# Patient Record
Sex: Female | Born: 1940 | Race: White | Hispanic: No | Smoking: Former smoker
Health system: Southern US, Community
[De-identification: ages and names within clinical notes are randomized; demographics above are authoritative.]

## PROBLEM LIST (undated history)

## (undated) DIAGNOSIS — I4901 Ventricular fibrillation: Secondary | ICD-10-CM

## (undated) DIAGNOSIS — I2699 Other pulmonary embolism without acute cor pulmonale: Secondary | ICD-10-CM

## (undated) DIAGNOSIS — I5181 Takotsubo syndrome: Secondary | ICD-10-CM

## (undated) DIAGNOSIS — I82409 Acute embolism and thrombosis of unspecified deep veins of unspecified lower extremity: Secondary | ICD-10-CM

## (undated) DIAGNOSIS — I639 Cerebral infarction, unspecified: Secondary | ICD-10-CM

## (undated) DIAGNOSIS — R011 Cardiac murmur, unspecified: Secondary | ICD-10-CM

## (undated) HISTORY — DX: Other pulmonary embolism without acute cor pulmonale: I26.99

## (undated) HISTORY — DX: Cardiac murmur, unspecified: R01.1

## (undated) HISTORY — DX: Takotsubo syndrome: I51.81

## (undated) HISTORY — DX: Acute embolism and thrombosis of unspecified deep veins of unspecified lower extremity: I82.409

## (undated) HISTORY — DX: Cerebral infarction, unspecified: I63.9

## (undated) HISTORY — DX: Ventricular fibrillation: I49.01

---

## 1989-06-10 DIAGNOSIS — I639 Cerebral infarction, unspecified: Secondary | ICD-10-CM

## 1989-06-10 HISTORY — DX: Cerebral infarction, unspecified: I63.9

## 2007-09-04 ENCOUNTER — Encounter: Admission: RE | Admit: 2007-09-04 | Discharge: 2007-09-04 | Payer: Self-pay | Admitting: Neurological Surgery

## 2007-09-24 ENCOUNTER — Inpatient Hospital Stay (HOSPITAL_COMMUNITY): Admission: RE | Admit: 2007-09-24 | Discharge: 2007-09-26 | Payer: Self-pay | Admitting: Neurological Surgery

## 2007-10-27 ENCOUNTER — Encounter: Admission: RE | Admit: 2007-10-27 | Discharge: 2007-10-27 | Payer: Self-pay | Admitting: Neurological Surgery

## 2007-12-17 ENCOUNTER — Encounter: Admission: RE | Admit: 2007-12-17 | Discharge: 2007-12-17 | Payer: Self-pay | Admitting: Neurological Surgery

## 2008-02-01 ENCOUNTER — Encounter: Admission: RE | Admit: 2008-02-01 | Discharge: 2008-02-01 | Payer: Self-pay | Admitting: Neurological Surgery

## 2008-03-15 ENCOUNTER — Encounter: Admission: RE | Admit: 2008-03-15 | Discharge: 2008-03-15 | Payer: Self-pay | Admitting: Neurological Surgery

## 2008-06-13 ENCOUNTER — Encounter: Admission: RE | Admit: 2008-06-13 | Discharge: 2008-06-13 | Payer: Self-pay | Admitting: Neurological Surgery

## 2008-07-11 ENCOUNTER — Encounter: Admission: RE | Admit: 2008-07-11 | Discharge: 2008-07-11 | Payer: Self-pay | Admitting: Neurological Surgery

## 2008-12-26 ENCOUNTER — Encounter: Admission: RE | Admit: 2008-12-26 | Discharge: 2008-12-26 | Payer: Self-pay | Admitting: Neurological Surgery

## 2009-07-03 ENCOUNTER — Encounter: Admission: RE | Admit: 2009-07-03 | Discharge: 2009-07-03 | Payer: Self-pay | Admitting: Neurological Surgery

## 2009-08-08 DIAGNOSIS — I5181 Takotsubo syndrome: Secondary | ICD-10-CM

## 2009-08-08 HISTORY — DX: Takotsubo syndrome: I51.81

## 2009-08-30 ENCOUNTER — Inpatient Hospital Stay (HOSPITAL_COMMUNITY): Admission: AC | Admit: 2009-08-30 | Discharge: 2009-09-26 | Payer: Self-pay

## 2009-09-07 ENCOUNTER — Encounter (INDEPENDENT_AMBULATORY_CARE_PROVIDER_SITE_OTHER): Payer: Self-pay | Admitting: Cardiology

## 2009-09-07 DIAGNOSIS — I4901 Ventricular fibrillation: Secondary | ICD-10-CM

## 2009-09-07 HISTORY — DX: Ventricular fibrillation: I49.01

## 2009-09-08 HISTORY — PX: CARDIAC CATHETERIZATION: SHX172

## 2009-09-21 ENCOUNTER — Ambulatory Visit: Payer: Self-pay | Admitting: Physical Medicine & Rehabilitation

## 2009-09-26 ENCOUNTER — Inpatient Hospital Stay (HOSPITAL_COMMUNITY)
Admission: RE | Admit: 2009-09-26 | Discharge: 2009-10-05 | Payer: Self-pay | Admitting: Physical Medicine & Rehabilitation

## 2009-09-26 ENCOUNTER — Ambulatory Visit: Payer: Self-pay | Admitting: Physical Medicine & Rehabilitation

## 2009-10-17 ENCOUNTER — Encounter: Admission: RE | Admit: 2009-10-17 | Discharge: 2009-10-17 | Payer: Self-pay | Admitting: Neurological Surgery

## 2009-11-14 ENCOUNTER — Encounter: Admission: RE | Admit: 2009-11-14 | Discharge: 2009-11-14 | Payer: Self-pay | Admitting: Neurological Surgery

## 2009-12-18 ENCOUNTER — Encounter: Admission: RE | Admit: 2009-12-18 | Discharge: 2009-12-18 | Payer: Self-pay | Admitting: Neurological Surgery

## 2009-12-28 ENCOUNTER — Emergency Department (HOSPITAL_COMMUNITY): Admission: EM | Admit: 2009-12-28 | Discharge: 2009-12-28 | Payer: Self-pay | Admitting: Emergency Medicine

## 2010-02-26 ENCOUNTER — Encounter: Admission: RE | Admit: 2010-02-26 | Discharge: 2010-02-26 | Payer: Self-pay | Admitting: Unknown Physician Specialty

## 2010-04-03 ENCOUNTER — Encounter: Admission: RE | Admit: 2010-04-03 | Discharge: 2010-04-03 | Payer: Self-pay | Admitting: Neurological Surgery

## 2010-07-01 ENCOUNTER — Encounter: Payer: Self-pay | Admitting: Neurological Surgery

## 2010-08-09 ENCOUNTER — Other Ambulatory Visit (HOSPITAL_COMMUNITY): Payer: Self-pay | Admitting: Cardiology

## 2010-08-09 DIAGNOSIS — R4781 Slurred speech: Secondary | ICD-10-CM

## 2010-08-09 DIAGNOSIS — H538 Other visual disturbances: Secondary | ICD-10-CM

## 2010-08-13 ENCOUNTER — Ambulatory Visit (HOSPITAL_COMMUNITY)
Admission: RE | Admit: 2010-08-13 | Discharge: 2010-08-13 | Disposition: A | Discharge status: Home / Self Care | Payer: Medicare Other | Source: Ambulatory Visit | Attending: Cardiology | Admitting: Cardiology

## 2010-08-13 DIAGNOSIS — R4789 Other speech disturbances: Secondary | ICD-10-CM | POA: Insufficient documentation

## 2010-08-13 DIAGNOSIS — H538 Other visual disturbances: Secondary | ICD-10-CM | POA: Insufficient documentation

## 2010-08-13 DIAGNOSIS — I6350 Cerebral infarction due to unspecified occlusion or stenosis of unspecified cerebral artery: Secondary | ICD-10-CM | POA: Insufficient documentation

## 2010-08-13 DIAGNOSIS — R4781 Slurred speech: Secondary | ICD-10-CM

## 2010-08-13 DIAGNOSIS — R51 Headache: Secondary | ICD-10-CM | POA: Insufficient documentation

## 2010-08-13 DIAGNOSIS — R29898 Other symptoms and signs involving the musculoskeletal system: Secondary | ICD-10-CM | POA: Insufficient documentation

## 2010-08-28 LAB — PROTIME-INR
INR: 3.02 — ABNORMAL HIGH (ref 0.00–1.49)
INR: 3.19 — ABNORMAL HIGH (ref 0.00–1.49)
INR: 3.39 — ABNORMAL HIGH (ref 0.00–1.49)
Prothrombin Time: 34 seconds — ABNORMAL HIGH (ref 11.6–15.2)
Prothrombin Time: 29 seconds — ABNORMAL HIGH (ref 11.6–15.2)

## 2010-08-28 LAB — CBC
HCT: 32.4 % — ABNORMAL LOW (ref 36.0–46.0)
HCT: 32.7 % — ABNORMAL LOW (ref 36.0–46.0)
HCT: 33.1 % — ABNORMAL LOW (ref 36.0–46.0)
Hemoglobin: 10.8 g/dL — ABNORMAL LOW (ref 12.0–15.0)
Hemoglobin: 10.9 g/dL — ABNORMAL LOW (ref 12.0–15.0)
Hemoglobin: 11.7 g/dL — ABNORMAL LOW (ref 12.0–15.0)
MCHC: 32.6 g/dL (ref 30.0–36.0)
MCHC: 32.8 g/dL (ref 30.0–36.0)
MCHC: 33.7 g/dL (ref 30.0–36.0)
MCHC: 34.2 g/dL (ref 30.0–36.0)
MCHC: 34.3 g/dL (ref 30.0–36.0)
MCV: 91.6 fL (ref 78.0–100.0)
MCV: 91.7 fL (ref 78.0–100.0)
MCV: 91.8 fL (ref 78.0–100.0)
MCV: 92.3 fL (ref 78.0–100.0)
Platelets: 222 10*3/uL (ref 150–400)
Platelets: 247 10*3/uL (ref 150–400)
RBC: 3.59 MIL/uL — ABNORMAL LOW (ref 3.87–5.11)
RBC: 3.7 MIL/uL — ABNORMAL LOW (ref 3.87–5.11)
RDW: 18.6 % — ABNORMAL HIGH (ref 11.5–15.5)
RDW: 18.7 % — ABNORMAL HIGH (ref 11.5–15.5)
RDW: 19.1 % — ABNORMAL HIGH (ref 11.5–15.5)
RDW: 19.3 % — ABNORMAL HIGH (ref 11.5–15.5)
WBC: 6.2 10*3/uL (ref 4.0–10.5)

## 2010-08-28 LAB — BASIC METABOLIC PANEL
BUN: 10 mg/dL (ref 6–23)
CO2: 27 mEq/L (ref 19–32)
Calcium: 9 mg/dL (ref 8.4–10.5)
Chloride: 104 mEq/L (ref 96–112)
Creatinine, Ser: 0.58 mg/dL (ref 0.4–1.2)
Creatinine, Ser: 0.66 mg/dL (ref 0.4–1.2)
GFR calc Af Amer: >60 mL/min (ref >60)
GFR calc non Af Amer: >60 mL/min (ref >60)
Glucose, Bld: 118 mg/dL — ABNORMAL HIGH (ref 70–99)
Glucose, Bld: 99 mg/dL (ref 70–99)
Potassium: 4 mEq/L (ref 3.5–5.1)

## 2010-08-28 LAB — URINE CULTURE
Colony Count: >=100000
Colony Count: >=100000

## 2010-08-28 LAB — URINALYSIS, MICROSCOPIC ONLY
Bilirubin Urine: NEGATIVE
Glucose, UA: NEGATIVE mg/dL
Ketones, ur: NEGATIVE mg/dL
Nitrite: NEGATIVE
Protein, ur: 30 mg/dL — AB
Protein, ur: NEGATIVE mg/dL
Specific Gravity, Urine: 1.021 (ref 1.005–1.030)
Urobilinogen, UA: 1 mg/dL (ref 0.0–1.0)
pH: 5.5 (ref 5.0–8.0)

## 2010-08-28 LAB — DIFFERENTIAL
Basophils Absolute: 0.1 10*3/uL (ref 0.0–0.1)
Eosinophils Relative: 4 % (ref 0–5)
Lymphocytes Relative: 28 % (ref 12–46)
Neutro Abs: 2.7 10*3/uL (ref 1.7–7.7)
Neutrophils Relative %: 55 % (ref 43–77)

## 2010-08-28 LAB — COMPREHENSIVE METABOLIC PANEL
ALT: 21 U/L (ref 0–35)
AST: 21 U/L (ref 0–37)
Alkaline Phosphatase: 178 U/L — ABNORMAL HIGH (ref 39–117)
CO2: 26 mEq/L (ref 19–32)
Chloride: 100 mEq/L (ref 96–112)
GFR calc Af Amer: >60 mL/min (ref >60)
GFR calc non Af Amer: >60 mL/min (ref >60)
Glucose, Bld: 107 mg/dL — ABNORMAL HIGH (ref 70–99)
Sodium: 135 mEq/L (ref 135–145)
Total Bilirubin: 0.6 mg/dL (ref 0.3–1.2)

## 2010-08-28 LAB — EXPECTORATED SPUTUM ASSESSMENT W GRAM STAIN, RFLX TO RESP C

## 2010-08-28 LAB — CULTURE, RESPIRATORY W GRAM STAIN: Culture: NORMAL

## 2010-08-29 LAB — BASIC METABOLIC PANEL
BUN: 5 mg/dL — ABNORMAL LOW (ref 6–23)
BUN: 5 mg/dL — ABNORMAL LOW (ref 6–23)
BUN: 6 mg/dL (ref 6–23)
BUN: 8 mg/dL (ref 6–23)
CO2: 23 mEq/L (ref 19–32)
CO2: 25 mEq/L (ref 19–32)
CO2: 25 mEq/L (ref 19–32)
CO2: 26 mEq/L (ref 19–32)
CO2: 27 mEq/L (ref 19–32)
CO2: 27 mEq/L (ref 19–32)
CO2: 28 mEq/L (ref 19–32)
CO2: 28 mEq/L (ref 19–32)
Calcium: 7.9 mg/dL — ABNORMAL LOW (ref 8.4–10.5)
Calcium: 8 mg/dL — ABNORMAL LOW (ref 8.4–10.5)
Calcium: 8 mg/dL — ABNORMAL LOW (ref 8.4–10.5)
Calcium: 8.3 mg/dL — ABNORMAL LOW (ref 8.4–10.5)
Calcium: 8.5 mg/dL (ref 8.4–10.5)
Calcium: 8.7 mg/dL (ref 8.4–10.5)
Chloride: 101 mEq/L (ref 96–112)
Chloride: 102 mEq/L (ref 96–112)
Chloride: 102 mEq/L (ref 96–112)
Chloride: 105 mEq/L (ref 96–112)
Chloride: 99 mEq/L (ref 96–112)
Creatinine, Ser: 0.43 mg/dL (ref 0.4–1.2)
Creatinine, Ser: 0.48 mg/dL (ref 0.4–1.2)
Creatinine, Ser: 0.48 mg/dL (ref 0.4–1.2)
Creatinine, Ser: 0.53 mg/dL (ref 0.4–1.2)
Creatinine, Ser: 0.64 mg/dL (ref 0.4–1.2)
GFR calc Af Amer: >60 mL/min (ref >60)
GFR calc Af Amer: >60 mL/min (ref >60)
GFR calc Af Amer: >60 mL/min (ref >60)
GFR calc Af Amer: >60 mL/min (ref >60)
GFR calc Af Amer: >60 mL/min (ref >60)
GFR calc Af Amer: >60 mL/min (ref >60)
GFR calc Af Amer: >60 mL/min (ref >60)
GFR calc non Af Amer: >60 mL/min (ref >60)
GFR calc non Af Amer: >60 mL/min (ref >60)
GFR calc non Af Amer: >60 mL/min (ref >60)
GFR calc non Af Amer: >60 mL/min (ref >60)
GFR calc non Af Amer: >60 mL/min (ref >60)
GFR calc non Af Amer: >60 mL/min (ref >60)
Glucose, Bld: 108 mg/dL — ABNORMAL HIGH (ref 70–99)
Glucose, Bld: 114 mg/dL — ABNORMAL HIGH (ref 70–99)
Glucose, Bld: 120 mg/dL — ABNORMAL HIGH (ref 70–99)
Glucose, Bld: 128 mg/dL — ABNORMAL HIGH (ref 70–99)
Glucose, Bld: 129 mg/dL — ABNORMAL HIGH (ref 70–99)
Glucose, Bld: 133 mg/dL — ABNORMAL HIGH (ref 70–99)
Potassium: 3.2 mEq/L — ABNORMAL LOW (ref 3.5–5.1)
Potassium: 3.2 mEq/L — ABNORMAL LOW (ref 3.5–5.1)
Potassium: 3.5 mEq/L (ref 3.5–5.1)
Potassium: 3.7 mEq/L (ref 3.5–5.1)
Potassium: 4 mEq/L (ref 3.5–5.1)
Sodium: 133 mEq/L — ABNORMAL LOW (ref 135–145)
Sodium: 135 mEq/L (ref 135–145)
Sodium: 136 mEq/L (ref 135–145)
Sodium: 136 mEq/L (ref 135–145)
Sodium: 136 mEq/L (ref 135–145)
Sodium: 138 mEq/L (ref 135–145)
Sodium: 139 mEq/L (ref 135–145)

## 2010-08-29 LAB — CBC
HCT: 25.5 % — ABNORMAL LOW (ref 36.0–46.0)
HCT: 25.7 % — ABNORMAL LOW (ref 36.0–46.0)
HCT: 26 % — ABNORMAL LOW (ref 36.0–46.0)
HCT: 26.7 % — ABNORMAL LOW (ref 36.0–46.0)
HCT: 28.2 % — ABNORMAL LOW (ref 36.0–46.0)
HCT: 32.1 % — ABNORMAL LOW (ref 36.0–46.0)
HCT: 32.8 % — ABNORMAL LOW (ref 36.0–46.0)
Hemoglobin: 11.1 g/dL — ABNORMAL LOW (ref 12.0–15.0)
Hemoglobin: 8.6 g/dL — ABNORMAL LOW (ref 12.0–15.0)
Hemoglobin: 9 g/dL — ABNORMAL LOW (ref 12.0–15.0)
Hemoglobin: 9.1 g/dL — ABNORMAL LOW (ref 12.0–15.0)
Hemoglobin: 9.2 g/dL — ABNORMAL LOW (ref 12.0–15.0)
MCHC: 32.8 g/dL (ref 30.0–36.0)
MCHC: 33.1 g/dL (ref 30.0–36.0)
MCHC: 33.2 g/dL (ref 30.0–36.0)
MCHC: 33.3 g/dL (ref 30.0–36.0)
MCHC: 33.7 g/dL (ref 30.0–36.0)
MCV: 89.9 fL (ref 78.0–100.0)
MCV: 91.2 fL (ref 78.0–100.0)
MCV: 91.5 fL (ref 78.0–100.0)
MCV: 92.2 fL (ref 78.0–100.0)
Platelets: 234 10*3/uL (ref 150–400)
Platelets: 261 10*3/uL (ref 150–400)
Platelets: 296 10*3/uL (ref 150–400)
Platelets: 399 10*3/uL (ref 150–400)
Platelets: 406 10*3/uL — ABNORMAL HIGH (ref 150–400)
RBC: 2.85 MIL/uL — ABNORMAL LOW (ref 3.87–5.11)
RBC: 2.95 MIL/uL — ABNORMAL LOW (ref 3.87–5.11)
RBC: 2.98 MIL/uL — ABNORMAL LOW (ref 3.87–5.11)
RBC: 3.1 MIL/uL — ABNORMAL LOW (ref 3.87–5.11)
RBC: 3.48 MIL/uL — ABNORMAL LOW (ref 3.87–5.11)
RBC: 3.68 MIL/uL — ABNORMAL LOW (ref 3.87–5.11)
RDW: 17.5 % — ABNORMAL HIGH (ref 11.5–15.5)
RDW: 17.6 % — ABNORMAL HIGH (ref 11.5–15.5)
RDW: 17.6 % — ABNORMAL HIGH (ref 11.5–15.5)
RDW: 17.6 % — ABNORMAL HIGH (ref 11.5–15.5)
WBC: 6.5 10*3/uL (ref 4.0–10.5)
WBC: 6.7 10*3/uL (ref 4.0–10.5)
WBC: 7.3 10*3/uL (ref 4.0–10.5)
WBC: 7.4 10*3/uL (ref 4.0–10.5)
WBC: 8 10*3/uL (ref 4.0–10.5)

## 2010-08-29 LAB — POCT I-STAT 3, ART BLOOD GAS (G3+)
Acid-Base Excess: 2 mmol/L (ref 0.0–2.0)
Acid-Base Excess: 6 mmol/L — ABNORMAL HIGH (ref 0.0–2.0)
Bicarbonate: 25.5 meq/L — ABNORMAL HIGH (ref 20.0–24.0)
Bicarbonate: 26.9 meq/L — ABNORMAL HIGH (ref 20.0–24.0)
O2 Saturation: 100 %
O2 Saturation: 97 %
O2 Saturation: 98 %
O2 Saturation: 98 %
Patient temperature: 98.6
Patient temperature: 99.1
TCO2: 26 mmol/L (ref 0–100)
TCO2: 27 mmol/L (ref 0–100)
TCO2: 27 mmol/L (ref 0–100)
TCO2: 28 mmol/L (ref 0–100)
pCO2 arterial: 36.9 mmHg (ref 35.0–45.0)
pCO2 arterial: 37 mmHg (ref 35.0–45.0)
pCO2 arterial: 44.1 mmHg (ref 35.0–45.0)
pCO2 arterial: 44.7 mmHg (ref 35.0–45.0)
pH, Arterial: 7.388 (ref 7.350–7.400)
pH, Arterial: 7.436 — ABNORMAL HIGH (ref 7.350–7.400)

## 2010-08-29 LAB — HEPARIN LEVEL (UNFRACTIONATED)
Heparin Unfractionated: 0.11 IU/mL — ABNORMAL LOW (ref 0.30–0.70)
Heparin Unfractionated: 0.2 IU/mL — ABNORMAL LOW (ref 0.30–0.70)
Heparin Unfractionated: 0.29 IU/mL — ABNORMAL LOW (ref 0.30–0.70)

## 2010-08-29 LAB — CROSSMATCH

## 2010-08-29 LAB — CARDIAC PANEL(CRET KIN+CKTOT+MB+TROPI)
CK, MB: 1.7 ng/mL (ref 0.3–4.0)
Relative Index: INVALID (ref 0.0–2.5)
Relative Index: INVALID (ref 0.0–2.5)
Total CK: 18 U/L (ref 7–177)
Troponin I: 0.09 ng/mL — ABNORMAL HIGH (ref 0.00–0.06)
Troponin I: 0.1 ng/mL — ABNORMAL HIGH (ref 0.00–0.06)

## 2010-08-29 LAB — DIFFERENTIAL
Basophils Absolute: 0 10*3/uL (ref 0.0–0.1)
Eosinophils Relative: 4 % (ref 0–5)
Lymphocytes Relative: 16 % (ref 12–46)

## 2010-08-29 LAB — CK TOTAL AND CKMB (NOT AT ARMC)
CK, MB: 34.4 ng/mL (ref 0.3–4.0)
Total CK: 218 U/L — ABNORMAL HIGH (ref 7–177)

## 2010-08-29 LAB — PROTIME-INR
INR: 2.75 — ABNORMAL HIGH (ref 0.00–1.49)
INR: 3.17 — ABNORMAL HIGH (ref 0.00–1.49)
Prothrombin Time: 24.9 seconds — ABNORMAL HIGH (ref 11.6–15.2)
Prothrombin Time: 28.9 seconds — ABNORMAL HIGH (ref 11.6–15.2)

## 2010-08-29 LAB — APTT: aPTT: 43 seconds — ABNORMAL HIGH (ref 24–37)

## 2010-08-29 LAB — TROPONIN I
Troponin I: 3.88 ng/mL (ref 0.00–0.06)
Troponin I: 4.17 ng/mL (ref 0.00–0.06)

## 2010-08-29 LAB — CULTURE, RESPIRATORY W GRAM STAIN: Gram Stain: NONE SEEN

## 2010-09-03 LAB — GLUCOSE, CAPILLARY: Glucose-Capillary: 149 mg/dL — ABNORMAL HIGH (ref 70–99)

## 2010-09-03 LAB — BASIC METABOLIC PANEL
BUN: 10 mg/dL (ref 6–23)
BUN: 11 mg/dL (ref 6–23)
BUN: 7 mg/dL (ref 6–23)
BUN: 7 mg/dL (ref 6–23)
BUN: 9 mg/dL (ref 6–23)
CO2: 25 mEq/L (ref 19–32)
CO2: 27 mEq/L (ref 19–32)
CO2: 29 mEq/L (ref 19–32)
CO2: 30 mEq/L (ref 19–32)
Calcium: 7.7 mg/dL — ABNORMAL LOW (ref 8.4–10.5)
Calcium: 8.6 mg/dL (ref 8.4–10.5)
Calcium: 8.8 mg/dL (ref 8.4–10.5)
Chloride: 101 mEq/L (ref 96–112)
Chloride: 102 mEq/L (ref 96–112)
Chloride: 107 mEq/L (ref 96–112)
Chloride: 92 mEq/L — ABNORMAL LOW (ref 96–112)
Chloride: 98 mEq/L (ref 96–112)
Chloride: 98 mEq/L (ref 96–112)
Creatinine, Ser: 0.42 mg/dL (ref 0.4–1.2)
Creatinine, Ser: 0.48 mg/dL (ref 0.4–1.2)
Creatinine, Ser: 0.51 mg/dL (ref 0.4–1.2)
Creatinine, Ser: 0.58 mg/dL (ref 0.4–1.2)
Creatinine, Ser: 0.93 mg/dL (ref 0.4–1.2)
GFR calc Af Amer: >60 mL/min (ref >60)
GFR calc Af Amer: >60 mL/min (ref >60)
GFR calc Af Amer: >60 mL/min (ref >60)
GFR calc Af Amer: >60 mL/min (ref >60)
GFR calc Af Amer: >60 mL/min (ref >60)
GFR calc non Af Amer: >60 mL/min (ref >60)
GFR calc non Af Amer: >60 mL/min (ref >60)
GFR calc non Af Amer: >60 mL/min (ref >60)
Glucose, Bld: 125 mg/dL — ABNORMAL HIGH (ref 70–99)
Glucose, Bld: 131 mg/dL — ABNORMAL HIGH (ref 70–99)
Glucose, Bld: 138 mg/dL — ABNORMAL HIGH (ref 70–99)
Glucose, Bld: 472 mg/dL — ABNORMAL HIGH (ref 70–99)
Potassium: 3.4 mEq/L — ABNORMAL LOW (ref 3.5–5.1)
Potassium: 3.7 mEq/L (ref 3.5–5.1)
Potassium: 3.7 mEq/L (ref 3.5–5.1)
Potassium: 3.8 mEq/L (ref 3.5–5.1)
Potassium: 4.1 mEq/L (ref 3.5–5.1)
Sodium: 134 mEq/L — ABNORMAL LOW (ref 135–145)
Sodium: 136 mEq/L (ref 135–145)
Sodium: 137 mEq/L (ref 135–145)

## 2010-09-03 LAB — CBC
HCT: 22.5 % — ABNORMAL LOW (ref 36.0–46.0)
HCT: 23.3 % — ABNORMAL LOW (ref 36.0–46.0)
HCT: 24.1 % — ABNORMAL LOW (ref 36.0–46.0)
HCT: 25.8 % — ABNORMAL LOW (ref 36.0–46.0)
HCT: 26.3 % — ABNORMAL LOW (ref 36.0–46.0)
HCT: 26.6 % — ABNORMAL LOW (ref 36.0–46.0)
HCT: 29.4 % — ABNORMAL LOW (ref 36.0–46.0)
HCT: 32.9 % — ABNORMAL LOW (ref 36.0–46.0)
Hemoglobin: 11.1 g/dL — ABNORMAL LOW (ref 12.0–15.0)
Hemoglobin: 7.7 g/dL — ABNORMAL LOW (ref 12.0–15.0)
Hemoglobin: 8 g/dL — ABNORMAL LOW (ref 12.0–15.0)
Hemoglobin: 8.3 g/dL — ABNORMAL LOW (ref 12.0–15.0)
Hemoglobin: 9.4 g/dL — ABNORMAL LOW (ref 12.0–15.0)
MCHC: 32.1 g/dL (ref 30.0–36.0)
MCHC: 33.4 g/dL (ref 30.0–36.0)
MCHC: 33.7 g/dL (ref 30.0–36.0)
MCHC: 34.1 g/dL (ref 30.0–36.0)
MCV: 90.2 fL (ref 78.0–100.0)
MCV: 90.8 fL (ref 78.0–100.0)
MCV: 90.8 fL (ref 78.0–100.0)
MCV: 91.8 fL (ref 78.0–100.0)
MCV: 92.5 fL (ref 78.0–100.0)
MCV: 93.1 fL (ref 78.0–100.0)
MCV: 93.4 fL (ref 78.0–100.0)
Platelets: 103 10*3/uL — ABNORMAL LOW (ref 150–400)
Platelets: 124 10*3/uL — ABNORMAL LOW (ref 150–400)
Platelets: 133 10*3/uL — ABNORMAL LOW (ref 150–400)
Platelets: 138 10*3/uL — ABNORMAL LOW (ref 150–400)
Platelets: 139 10*3/uL — ABNORMAL LOW (ref 150–400)
Platelets: 209 10*3/uL (ref 150–400)
Platelets: 97 10*3/uL — ABNORMAL LOW (ref 150–400)
RBC: 2.57 MIL/uL — ABNORMAL LOW (ref 3.87–5.11)
RBC: 2.65 MIL/uL — ABNORMAL LOW (ref 3.87–5.11)
RBC: 3.11 MIL/uL — ABNORMAL LOW (ref 3.87–5.11)
RBC: 3.27 MIL/uL — ABNORMAL LOW (ref 3.87–5.11)
RBC: 3.63 MIL/uL — ABNORMAL LOW (ref 3.87–5.11)
RDW: 15.1 % (ref 11.5–15.5)
RDW: 15.1 % (ref 11.5–15.5)
RDW: 16.3 % — ABNORMAL HIGH (ref 11.5–15.5)
RDW: 16.4 % — ABNORMAL HIGH (ref 11.5–15.5)
WBC: 16.2 10*3/uL — ABNORMAL HIGH (ref 4.0–10.5)
WBC: 5.8 10*3/uL (ref 4.0–10.5)
WBC: 6.5 10*3/uL (ref 4.0–10.5)
WBC: 6.6 10*3/uL (ref 4.0–10.5)
WBC: 7 10*3/uL (ref 4.0–10.5)
WBC: 7.9 10*3/uL (ref 4.0–10.5)
WBC: 8 10*3/uL (ref 4.0–10.5)

## 2010-09-03 LAB — POCT I-STAT 3, ART BLOOD GAS (G3+)
Bicarbonate: 34 meq/L — ABNORMAL HIGH (ref 20.0–24.0)
O2 Saturation: 100 %
TCO2: 36 mmol/L (ref 0–100)
pCO2 arterial: 51.1 mmHg — ABNORMAL HIGH (ref 35.0–45.0)
pH, Arterial: 7.432 — ABNORMAL HIGH (ref 7.350–7.400)
pO2, Arterial: 304 mmHg — ABNORMAL HIGH (ref 80.0–100.0)

## 2010-09-03 LAB — POCT I-STAT 4, (NA,K, GLUC, HGB,HCT)
Glucose, Bld: 120 mg/dL — ABNORMAL HIGH (ref 70–99)
Glucose, Bld: 180 mg/dL — ABNORMAL HIGH (ref 70–99)
HCT: 23 % — ABNORMAL LOW (ref 36.0–46.0)
Hemoglobin: 7.8 g/dL — ABNORMAL LOW (ref 12.0–15.0)
Hemoglobin: 7.8 g/dL — ABNORMAL LOW (ref 12.0–15.0)
Potassium: 4.5 meq/L (ref 3.5–5.1)
Potassium: 4.5 meq/L (ref 3.5–5.1)
Sodium: 135 meq/L (ref 135–145)

## 2010-09-03 LAB — COMPREHENSIVE METABOLIC PANEL
AST: 29 U/L (ref 0–37)
Albumin: 2.5 g/dL — ABNORMAL LOW (ref 3.5–5.2)
Albumin: 2.9 g/dL — ABNORMAL LOW (ref 3.5–5.2)
Alkaline Phosphatase: 50 U/L (ref 39–117)
BUN: 17 mg/dL (ref 6–23)
Chloride: 112 mEq/L (ref 96–112)
Creatinine, Ser: 0.66 mg/dL (ref 0.4–1.2)
GFR calc Af Amer: >60 mL/min (ref >60)
GFR calc Af Amer: >60 mL/min (ref >60)
Potassium: 3.6 mEq/L (ref 3.5–5.1)
Sodium: 139 mEq/L (ref 135–145)
Total Bilirubin: 0.5 mg/dL (ref 0.3–1.2)
Total Protein: 4.2 g/dL — ABNORMAL LOW (ref 6.0–8.3)
Total Protein: 4.8 g/dL — ABNORMAL LOW (ref 6.0–8.3)

## 2010-09-03 LAB — POCT I-STAT, CHEM 8
BUN: 24 mg/dL — ABNORMAL HIGH (ref 6–23)
Calcium, Ion: 1.03 mmol/L — ABNORMAL LOW (ref 1.12–1.32)
Chloride: 111 meq/L (ref 96–112)
Glucose, Bld: 107 mg/dL — ABNORMAL HIGH (ref 70–99)
HCT: 30 % — ABNORMAL LOW (ref 36.0–46.0)
Potassium: 3.6 meq/L (ref 3.5–5.1)

## 2010-09-03 LAB — HEPATIC FUNCTION PANEL
ALT: 24 U/L (ref 0–35)
Bilirubin, Direct: 0.2 mg/dL (ref 0.0–0.3)
Indirect Bilirubin: 0.4 mg/dL (ref 0.3–0.9)
Total Protein: 4.4 g/dL — ABNORMAL LOW (ref 6.0–8.3)

## 2010-09-03 LAB — TYPE AND SCREEN: ABO/RH(D): A POS

## 2010-09-03 LAB — CROSSMATCH
ABO/RH(D): A POS
Antibody Screen: NEGATIVE

## 2010-09-03 LAB — CARDIAC PANEL(CRET KIN+CKTOT+MB+TROPI)
CK, MB: 2.7 ng/mL (ref 0.3–4.0)
Total CK: 141 U/L (ref 7–177)
Troponin I: 0.04 ng/mL (ref 0.00–0.06)

## 2010-09-03 LAB — POCT I-STAT 7, (LYTES, BLD GAS, ICA,H+H)
Acid-Base Excess: 5 mmol/L — ABNORMAL HIGH (ref 0.0–2.0)
Bicarbonate: 30.4 meq/L — ABNORMAL HIGH (ref 20.0–24.0)
Hemoglobin: 7.8 g/dL — ABNORMAL LOW (ref 12.0–15.0)
Potassium: 3.5 meq/L (ref 3.5–5.1)
Sodium: 135 meq/L (ref 135–145)
TCO2: 32 mmol/L (ref 0–100)
pH, Arterial: 7.396 (ref 7.350–7.400)

## 2010-09-03 LAB — POCT I-STAT EG7
Calcium, Ion: 1.15 mmol/L (ref 1.12–1.32)
Hemoglobin: 9.9 g/dL — ABNORMAL LOW (ref 12.0–15.0)
O2 Saturation: 92 %
Potassium: 4.7 meq/L (ref 3.5–5.1)
Sodium: 140 meq/L (ref 135–145)
TCO2: 24 mmol/L (ref 0–100)
pH, Ven: 7.394 — ABNORMAL HIGH (ref 7.250–7.300)

## 2010-09-03 LAB — VITAMIN B12: Vitamin B-12: 122 pg/mL — ABNORMAL LOW (ref 211–911)

## 2010-09-03 LAB — RETICULOCYTES
RBC.: 3.07 MIL/uL — ABNORMAL LOW (ref 3.87–5.11)
Retic Count, Absolute: 55.3 10*3/uL (ref 19.0–186.0)

## 2010-09-03 LAB — POCT CARDIAC MARKERS
CKMB, poc: 2.3 ng/mL (ref 1.0–8.0)
Troponin i, poc: <0.05 ng/mL (ref 0.00–0.09)

## 2010-09-03 LAB — BRAIN NATRIURETIC PEPTIDE: Pro B Natriuretic peptide (BNP): 155 pg/mL — ABNORMAL HIGH (ref 0.0–100.0)

## 2010-09-03 LAB — BLOOD GAS, ARTERIAL
Acid-Base Excess: 5.7 mmol/L — ABNORMAL HIGH (ref 0.0–2.0)
Bicarbonate: 30.4 mEq/L — ABNORMAL HIGH (ref 20.0–24.0)
O2 Saturation: 99.3 %
pCO2 arterial: 50.3 mmHg — ABNORMAL HIGH (ref 35.0–45.0)
pO2, Arterial: 102 mmHg — ABNORMAL HIGH (ref 80.0–100.0)

## 2010-09-03 LAB — LACTIC ACID, PLASMA: Lactic Acid, Venous: 1 mmol/L (ref 0.5–2.2)

## 2010-09-03 LAB — MAGNESIUM: Magnesium: 2.2 mg/dL (ref 1.5–2.5)

## 2010-09-03 LAB — APTT: aPTT: 25 seconds (ref 24–37)

## 2010-09-03 LAB — CK TOTAL AND CKMB (NOT AT ARMC): Total CK: 245 U/L — ABNORMAL HIGH (ref 7–177)

## 2010-09-03 LAB — IRON AND TIBC
Iron: 45 ug/dL (ref 42–135)
UIBC: 140 ug/dL

## 2010-09-03 LAB — GENTAMICIN LEVEL, RANDOM: Gentamicin Rm: 6.1 ug/mL

## 2010-09-03 LAB — MRSA PCR SCREENING: MRSA by PCR: NEGATIVE

## 2010-09-03 LAB — TROPONIN I: Troponin I: 2.13 ng/mL (ref 0.00–0.06)

## 2010-09-03 LAB — ABO/RH: ABO/RH(D): A POS

## 2010-09-03 LAB — HEPARIN LEVEL (UNFRACTIONATED): Heparin Unfractionated: 0.15 IU/mL — ABNORMAL LOW (ref 0.30–0.70)

## 2010-09-03 LAB — PREPARE RBC (CROSSMATCH)

## 2010-09-05 ENCOUNTER — Emergency Department (HOSPITAL_COMMUNITY)
Admission: EM | Admit: 2010-09-05 | Discharge: 2010-09-05 | Disposition: A | Discharge status: Home / Self Care | Payer: Medicare Other | Attending: Emergency Medicine | Admitting: Emergency Medicine

## 2010-09-05 DIAGNOSIS — Y849 Medical procedure, unspecified as the cause of abnormal reaction of the patient, or of later complication, without mention of misadventure at the time of the procedure: Secondary | ICD-10-CM | POA: Insufficient documentation

## 2010-09-05 DIAGNOSIS — I10 Essential (primary) hypertension: Secondary | ICD-10-CM | POA: Insufficient documentation

## 2010-09-05 DIAGNOSIS — Z9889 Other specified postprocedural states: Secondary | ICD-10-CM | POA: Insufficient documentation

## 2010-09-05 DIAGNOSIS — Y929 Unspecified place or not applicable: Secondary | ICD-10-CM | POA: Insufficient documentation

## 2010-09-05 DIAGNOSIS — T82598A Other mechanical complication of other cardiac and vascular devices and implants, initial encounter: Secondary | ICD-10-CM | POA: Insufficient documentation

## 2010-09-05 DIAGNOSIS — Z7901 Long term (current) use of anticoagulants: Secondary | ICD-10-CM | POA: Insufficient documentation

## 2010-09-05 DIAGNOSIS — I252 Old myocardial infarction: Secondary | ICD-10-CM | POA: Insufficient documentation

## 2010-09-05 DIAGNOSIS — F411 Generalized anxiety disorder: Secondary | ICD-10-CM | POA: Insufficient documentation

## 2010-09-05 DIAGNOSIS — Z8673 Personal history of transient ischemic attack (TIA), and cerebral infarction without residual deficits: Secondary | ICD-10-CM | POA: Insufficient documentation

## 2010-09-05 DIAGNOSIS — Z79899 Other long term (current) drug therapy: Secondary | ICD-10-CM | POA: Insufficient documentation

## 2010-09-05 DIAGNOSIS — Z7982 Long term (current) use of aspirin: Secondary | ICD-10-CM | POA: Insufficient documentation

## 2010-09-06 ENCOUNTER — Other Ambulatory Visit (HOSPITAL_COMMUNITY): Payer: Self-pay | Admitting: Emergency Medicine

## 2010-09-06 ENCOUNTER — Ambulatory Visit (HOSPITAL_COMMUNITY)
Admission: RE | Admit: 2010-09-06 | Discharge: 2010-09-06 | Disposition: A | Discharge status: Home / Self Care | Payer: Medicare Other | Source: Ambulatory Visit | Attending: Emergency Medicine | Admitting: Emergency Medicine

## 2010-09-06 DIAGNOSIS — I639 Cerebral infarction, unspecified: Secondary | ICD-10-CM

## 2010-09-06 DIAGNOSIS — Z452 Encounter for adjustment and management of vascular access device: Secondary | ICD-10-CM | POA: Insufficient documentation

## 2010-10-23 NOTE — Discharge Summary (Signed)
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   PROCEDURE:  Posterior lumbar interbody fusion L4-L5.   HISTORY OF PRESENT ILLNESS:  Ms. Dillen is a very pleasant 70 year old  female who is referred with severe back and left leg pain.  She had a CT  scan and MRI, which showed degenerative spondylolisthesis at L4-L5 with  significant spinal stenosis.  I recommended posterior lumbar interbody  fusion to address both her segmental instability and a spinal stenosis.  She understood the risks, benefits, expected outcome, and wished to  proceed.   HOSPITAL COURSE:  The patient was admitted on September 24, 2007, and taken  to the operating room where she underwent a PLIF at L4-L5.  The patient  tolerated the procedure well, was taken to recovery room and to the  floor in stable condition.  For the details of the operative procedure,  please see the dictated operative note.  The patient's hospital course  was routine.  There were no complications.  She remained in bed the  first evening with a PCA pump for pain control.  She used a very little  of this the following day, this was removed.  She was allowed out of  bed.  She was ambulating well without difficulty in her corset brace.  She was ambulating to the bathroom without difficulty.  She was voiding  without difficulty.  She had very little in way of back or leg pain.  She took very little pain medications.  On postop day #2, she was  discharged home in stable condition with plans to follow me in 2 weeks.   FINAL DIAGNOSIS:  Posterior lumbar interbody  fusion at L4-L5.      Tia Alert, MD  Electronically Signed     DSJ/MEDQ  D:  09/26/2007  T:  09/26/2007  Job:  [id number]

## 2010-10-23 NOTE — Op Note (Signed)
[name], [name]                ACCOUNT NO.:  [id number]   MEDICAL RECORD NO.:  [id number]          PATIENT TYPE:  INP   LOCATION:  3001                         FACILITY:  [facility]   PHYSICIAN:  Tia Alert, MD     DATE OF BIRTH:  05-15-1941   DATE OF PROCEDURE:  09/24/2007  DATE OF DISCHARGE:                               OPERATIVE REPORT   PREOPERATIVE DIAGNOSES:  1. Degenerative spondylolisthesis, L4-5.  2. Spinal stenosis, L4-5.  3. Back pain.  4. Left leg pain.   POSTOPERATIVE DIAGNOSES:  1. Degenerative spondylolisthesis, L4-5.  2. Spinal stenosis L4-5.  3. Back pain.  4. Left leg pain.   PROCEDURE:  1. Decompressive lumbar laminectomy, hemi facetectomy, and      foraminotomies at L4-5 for decompression of the L4 and L5 nerve      roots requiring more work than is typically required of the typical      type of procedure.  2. Posterior lumbar interbody fusion L4-5 utilizing a 10 x 22 mm Peak      interbody cage packed with local autograft and Actifuse and a 10 x      22 mm tangent interbody bone wedge.  The midline was packed with      local autograft and Actifuse.  3. Intertransverse arthrodesis L4-5 utilizing local autograft and      Actifuse.  4. Nonsegmental fixation L4-5 utilizing the Biomet pedicle screw      system.   SURGEON:  Tia Alert, MD   ASSISTANT:  Reinaldo Meeker, MD   ANESTHESIA:  General endotracheal.   COMPLICATIONS:  None apparent.   INDICATIONS FOR PROCEDURE:  [name] is a 70 year old female who was  referred with severe back pain with left leg pain.  She had an MRI and  then a CT scan, which showed degenerative spondylolisthesis at L4-5 with  severe facet arthropathy, grade 1 anterior listhesis suggestive of  segmental instability, and spinal stenosis at L4-5.  I recommended a  posterior lumbar interbody fusion with nonsegmental instrumentation to  address both the segmental instability and spinal stenosis.  She  understood the  risks, benefits, expected outcome, and wished to proceed.   DESCRIPTION OF PROCEDURE:  The patient was taken to the operating room  and after induction of adequate generalized endotracheal anesthesia, she  was rolled in a prone position on chest rolls and all pressure points  were padded.  Her lumbar region was prepped with DuraPrep and then  draped in the usual sterile fashion.  Local anesthesia 10 mL was  injected and a dorsal midline incision was made and carried down to the  lumbosacral fascia.  The fascia was opened and the paraspinous  musculature was taken down in a subperiosteal fashion to expose L4-5.  Intraoperative fluoroscopy confirmed that level and then I took the  dissection out of the facets to expose the transverse processes of L4  and L5.  I then used a Leksell rongeur to remove the spinous process and  then used the Kerrison punches to perform a complete laminectomy, hemi  facetectomy, and  foraminotomy.  The L4-5 showed significant overgrown  yellow ligament, which was removed.  The underlying L4 and L5 nerve  roots were decompressed distally into the respective foramina marching  along the pedicle wall.  Once the decompression was complete, we  coagulated the epidural venous vasculature to prepare for PLIF.  I  incised the disk space bilaterally and then sequentially distracted the  disk space up to 10 mm and then used the 10-mm rotating cutter and  cutting chisel to prepare the endplates.  The midline was prepared with  curettes.  We then used a 10 x 22 mm Peak interbody cage packed with  local autograft and Actifuse on the patient's right side.  On the left  side, she had a tangent interbody bone wedge and the midline was packed  with local autograft and Actifuse. We located the pedicle screw entry  zones utilizing surface landmarks and lateral fluoroscopy.  We probed  each pedicle with a pedicle probe, tapped each pedicle with a 5/5 tap,  then placed 65 x 45 mm  pedicle screws into the L4 and L5 pedicles  bilaterally.  We then checked this for AP and lateral fluoroscopy to  assure adequate placement of pedicle screws.  We then decorticated the  transverse processes and placed a mixture of local autograft and active  fuse to perform intertransverse arthrodesis at L4-L5.  We then placed  lordotic rods into the multiaxial screw heads, locked these into  position with locking caps and anti-torque device after achieving  compression on our grafts on the patient's left side.  We then irrigated  with saline solution containing bacitracin, dried all bleeding points as  best we could, lined the dura with Gelfoam, and placed a medium Hemovac  drain through a separate stab incision, then placed a Hemovac.  I then  placed a separate cross-link for added stability of the construct.  We  then closed the muscle and the fascia with 0 Vicryl, closing the  subcutaneous and subcuticular tissue with 2-0 and 3-0 Vicryl, and closed  the skin with Benzoin Steri-Strips.  The drapes were removed.  Sterile  dressing was applied.  The patient was awakened from general anesthesia  and transferred to recovery room in stable condition.  At the end of the  procedure, all sponge, needle, and instrument counts were correct.      Tia Alert, MD  Electronically Signed     DSJ/MEDQ  D:  09/24/2007  T:  09/25/2007  Job:  [id number]

## 2010-12-15 ENCOUNTER — Emergency Department (HOSPITAL_COMMUNITY)
Admission: EM | Admit: 2010-12-15 | Discharge: 2010-12-15 | Disposition: A | Discharge status: Home / Self Care | Payer: Medicare Other | Attending: Emergency Medicine | Admitting: Emergency Medicine

## 2010-12-15 ENCOUNTER — Emergency Department (HOSPITAL_COMMUNITY): Payer: Medicare Other

## 2010-12-15 DIAGNOSIS — Z9889 Other specified postprocedural states: Secondary | ICD-10-CM | POA: Insufficient documentation

## 2010-12-15 DIAGNOSIS — X58XXXA Exposure to other specified factors, initial encounter: Secondary | ICD-10-CM | POA: Insufficient documentation

## 2010-12-15 DIAGNOSIS — S82209A Unspecified fracture of shaft of unspecified tibia, initial encounter for closed fracture: Secondary | ICD-10-CM | POA: Insufficient documentation

## 2010-12-15 DIAGNOSIS — I10 Essential (primary) hypertension: Secondary | ICD-10-CM | POA: Insufficient documentation

## 2010-12-15 DIAGNOSIS — Z79899 Other long term (current) drug therapy: Secondary | ICD-10-CM | POA: Insufficient documentation

## 2010-12-15 DIAGNOSIS — Z87891 Personal history of nicotine dependence: Secondary | ICD-10-CM | POA: Insufficient documentation

## 2010-12-15 DIAGNOSIS — Z8673 Personal history of transient ischemic attack (TIA), and cerebral infarction without residual deficits: Secondary | ICD-10-CM | POA: Insufficient documentation

## 2010-12-15 DIAGNOSIS — F411 Generalized anxiety disorder: Secondary | ICD-10-CM | POA: Insufficient documentation

## 2010-12-15 LAB — DIFFERENTIAL
Lymphocytes Relative: 34 % (ref 12–46)
Lymphs Abs: 1.6 10*3/uL (ref 0.7–4.0)
Monocytes Relative: 11 % (ref 3–12)
Neutrophils Relative %: 52 % (ref 43–77)

## 2010-12-15 LAB — CBC
HCT: 41.4 % (ref 36.0–46.0)
MCH: 31.5 pg (ref 26.0–34.0)
MCV: 93.9 fL (ref 78.0–100.0)
RBC: 4.41 MIL/uL (ref 3.87–5.11)
WBC: 4.6 10*3/uL (ref 4.0–10.5)

## 2010-12-15 LAB — POCT I-STAT, CHEM 8
BUN: 20 mg/dL (ref 6–23)
Calcium, Ion: 1.26 mmol/L (ref 1.12–1.32)
Chloride: 102 mEq/L (ref 96–112)
Glucose, Bld: 89 mg/dL (ref 70–99)
HCT: 44 % (ref 36.0–46.0)
Potassium: 4.2 mEq/L (ref 3.5–5.1)
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
## 2011-03-05 LAB — CBC
HCT: 41
HCT: 31.6 — ABNORMAL LOW
HCT: 33.4 — ABNORMAL LOW
Hemoglobin: 13.8
Hemoglobin: 10.9 — ABNORMAL LOW
Hemoglobin: 11.5 — ABNORMAL LOW
MCHC: 33.6
MCV: 90.1
Platelets: 129 — ABNORMAL LOW
RBC: 4.55
RBC: 3.5 — ABNORMAL LOW
RDW: 13.9
WBC: 5.4

## 2011-03-05 LAB — TYPE AND SCREEN

## 2011-03-05 LAB — BASIC METABOLIC PANEL
CO2: 28
Chloride: 104
GFR calc Af Amer: >60
Glucose, Bld: 102 — ABNORMAL HIGH
Potassium: 5
Sodium: 139

## 2011-03-05 LAB — DIFFERENTIAL
Basophils Relative: 1
Eosinophils Absolute: 0.2
Eosinophils Relative: 4
Monocytes Absolute: 0.4
Monocytes Relative: 8
Neutro Abs: 2.6

## 2011-03-05 LAB — APTT: aPTT: 23 — ABNORMAL LOW

## 2011-03-05 LAB — ABO/RH: ABO/RH(D): A POS

## 2012-06-29 ENCOUNTER — Other Ambulatory Visit (HOSPITAL_COMMUNITY): Payer: Self-pay | Admitting: Internal Medicine

## 2012-06-29 DIAGNOSIS — Q228 Other congenital malformations of tricuspid valve: Secondary | ICD-10-CM

## 2012-06-29 DIAGNOSIS — I351 Nonrheumatic aortic (valve) insufficiency: Secondary | ICD-10-CM

## 2012-06-29 DIAGNOSIS — I340 Nonrheumatic mitral (valve) insufficiency: Secondary | ICD-10-CM

## 2012-06-29 DIAGNOSIS — I429 Cardiomyopathy, unspecified: Secondary | ICD-10-CM

## 2012-07-07 ENCOUNTER — Ambulatory Visit (HOSPITAL_COMMUNITY): Payer: Medicare Other

## 2012-07-08 ENCOUNTER — Ambulatory Visit (HOSPITAL_COMMUNITY)
Admission: RE | Admit: 2012-07-08 | Discharge: 2012-07-08 | Disposition: A | Discharge status: Home / Self Care | Payer: Medicare Other | Source: Ambulatory Visit | Attending: Internal Medicine | Admitting: Internal Medicine

## 2012-07-08 DIAGNOSIS — Q228 Other congenital malformations of tricuspid valve: Secondary | ICD-10-CM

## 2012-07-08 DIAGNOSIS — R011 Cardiac murmur, unspecified: Secondary | ICD-10-CM | POA: Insufficient documentation

## 2012-07-08 DIAGNOSIS — I379 Nonrheumatic pulmonary valve disorder, unspecified: Secondary | ICD-10-CM | POA: Insufficient documentation

## 2012-07-08 DIAGNOSIS — I429 Cardiomyopathy, unspecified: Secondary | ICD-10-CM

## 2012-07-08 DIAGNOSIS — I351 Nonrheumatic aortic (valve) insufficiency: Secondary | ICD-10-CM

## 2012-07-08 DIAGNOSIS — I369 Nonrheumatic tricuspid valve disorder, unspecified: Secondary | ICD-10-CM | POA: Insufficient documentation

## 2012-07-08 DIAGNOSIS — I340 Nonrheumatic mitral (valve) insufficiency: Secondary | ICD-10-CM

## 2012-07-08 DIAGNOSIS — I080 Rheumatic disorders of both mitral and aortic valves: Secondary | ICD-10-CM | POA: Insufficient documentation

## 2012-07-08 NOTE — Progress Notes (Signed)
2D Echo Performed 05/18/2013    Jude Linck, RCS  

## 2012-10-06 ENCOUNTER — Other Ambulatory Visit (HOSPITAL_COMMUNITY): Payer: Self-pay | Admitting: Internal Medicine

## 2012-10-06 DIAGNOSIS — R011 Cardiac murmur, unspecified: Secondary | ICD-10-CM

## 2012-10-13 ENCOUNTER — Ambulatory Visit (HOSPITAL_COMMUNITY): Payer: Medicare Other

## 2012-10-16 ENCOUNTER — Encounter: Payer: Self-pay | Admitting: Internal Medicine

## 2012-10-26 ENCOUNTER — Telehealth (HOSPITAL_COMMUNITY): Payer: Self-pay | Admitting: Internal Medicine

## 2012-11-16 ENCOUNTER — Encounter (INDEPENDENT_AMBULATORY_CARE_PROVIDER_SITE_OTHER): Payer: Medicare Other

## 2012-11-16 DIAGNOSIS — I059 Rheumatic mitral valve disease, unspecified: Secondary | ICD-10-CM

## 2012-11-16 LAB — PULMONARY FUNCTION TEST

## 2012-11-26 ENCOUNTER — Encounter: Payer: Self-pay | Admitting: Internal Medicine

## 2012-11-26 ENCOUNTER — Ambulatory Visit (INDEPENDENT_AMBULATORY_CARE_PROVIDER_SITE_OTHER): Payer: Medicare Other | Admitting: Internal Medicine

## 2012-11-26 VITALS — BP 100/60 | HR 60 | Ht 64.0 in | Wt 199.8 lb

## 2012-11-26 DIAGNOSIS — I5181 Takotsubo syndrome: Secondary | ICD-10-CM | POA: Insufficient documentation

## 2012-11-26 DIAGNOSIS — I82409 Acute embolism and thrombosis of unspecified deep veins of unspecified lower extremity: Secondary | ICD-10-CM | POA: Insufficient documentation

## 2012-11-26 DIAGNOSIS — N393 Stress incontinence (female) (male): Secondary | ICD-10-CM | POA: Insufficient documentation

## 2012-11-26 DIAGNOSIS — I2699 Other pulmonary embolism without acute cor pulmonale: Secondary | ICD-10-CM | POA: Insufficient documentation

## 2012-11-26 DIAGNOSIS — R0989 Other specified symptoms and signs involving the circulatory and respiratory systems: Secondary | ICD-10-CM

## 2012-11-26 DIAGNOSIS — R0609 Other forms of dyspnea: Secondary | ICD-10-CM | POA: Insufficient documentation

## 2012-11-26 NOTE — Patient Instructions (Signed)
Follow up in 6 months 
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
## 2013-02-15 ENCOUNTER — Other Ambulatory Visit: Payer: Self-pay | Admitting: Internal Medicine

## 2013-02-15 NOTE — Telephone Encounter (Signed)
Rx was sent to pharmacy electronically. 

## 2013-04-27 ENCOUNTER — Encounter (HOSPITAL_COMMUNITY): Payer: Self-pay | Admitting: *Deleted

## 2013-04-27 ENCOUNTER — Telehealth (HOSPITAL_COMMUNITY): Payer: Self-pay | Admitting: *Deleted

## 2013-05-13 ENCOUNTER — Ambulatory Visit (HOSPITAL_COMMUNITY): Payer: Medicare Other

## 2013-06-15 ENCOUNTER — Ambulatory Visit (INDEPENDENT_AMBULATORY_CARE_PROVIDER_SITE_OTHER): Payer: Medicare Other | Admitting: Internal Medicine

## 2013-06-15 ENCOUNTER — Encounter: Payer: Self-pay | Admitting: Internal Medicine

## 2013-06-15 VITALS — BP 120/78 | HR 57 | Ht 64.0 in | Wt 200.5 lb

## 2013-06-15 DIAGNOSIS — R0989 Other specified symptoms and signs involving the circulatory and respiratory systems: Secondary | ICD-10-CM

## 2013-06-15 DIAGNOSIS — N393 Stress incontinence (female) (male): Secondary | ICD-10-CM

## 2013-06-15 DIAGNOSIS — I5181 Takotsubo syndrome: Secondary | ICD-10-CM

## 2013-06-15 DIAGNOSIS — R0602 Shortness of breath: Secondary | ICD-10-CM

## 2013-06-15 DIAGNOSIS — R0609 Other forms of dyspnea: Secondary | ICD-10-CM
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
## 2013-06-15 NOTE — Patient Instructions (Signed)

## 2013-06-16 ENCOUNTER — Encounter: Payer: Self-pay | Admitting: Internal Medicine

## 2013-06-16 DIAGNOSIS — I5181 Takotsubo syndrome: Secondary | ICD-10-CM | POA: Insufficient documentation

## 2013-06-16 DIAGNOSIS — R0602 Shortness of breath: Secondary | ICD-10-CM | POA: Insufficient documentation

## 2013-06-17 ENCOUNTER — Encounter: Payer: Self-pay | Admitting: Internal Medicine

## 2013-06-28 ENCOUNTER — Inpatient Hospital Stay (HOSPITAL_COMMUNITY): Admission: RE | Admit: 2013-06-28 | Payer: Medicare Other | Source: Ambulatory Visit

## 2013-07-26 ENCOUNTER — Ambulatory Visit (HOSPITAL_COMMUNITY)
Admission: RE | Admit: 2013-07-26 | Discharge: 2013-07-26 | Disposition: A | Discharge status: Home / Self Care | Payer: Medicare Other | Source: Ambulatory Visit | Attending: Internal Medicine | Admitting: Internal Medicine

## 2013-07-26 DIAGNOSIS — I379 Nonrheumatic pulmonary valve disorder, unspecified: Secondary | ICD-10-CM | POA: Insufficient documentation

## 2013-07-26 DIAGNOSIS — I5181 Takotsubo syndrome: Secondary | ICD-10-CM | POA: Insufficient documentation

## 2013-07-26 DIAGNOSIS — I079 Rheumatic tricuspid valve disease, unspecified: Secondary | ICD-10-CM | POA: Insufficient documentation

## 2013-07-26 DIAGNOSIS — I080 Rheumatic disorders of both mitral and aortic valves: Secondary | ICD-10-CM | POA: Insufficient documentation

## 2013-07-26 DIAGNOSIS — R0989 Other specified symptoms and signs involving the circulatory and respiratory systems: Secondary | ICD-10-CM | POA: Insufficient documentation

## 2013-07-26 DIAGNOSIS — R0602 Shortness of breath: Secondary | ICD-10-CM

## 2013-07-26 DIAGNOSIS — I359 Nonrheumatic aortic valve disorder, unspecified: Secondary | ICD-10-CM

## 2013-07-26 DIAGNOSIS — Z86711 Personal history of pulmonary embolism: Secondary | ICD-10-CM | POA: Insufficient documentation

## 2013-07-26 DIAGNOSIS — Z86718 Personal history of other venous thrombosis and embolism: Secondary | ICD-10-CM | POA: Insufficient documentation

## 2013-07-26 DIAGNOSIS — R0609 Other forms of dyspnea: Secondary | ICD-10-CM | POA: Insufficient documentation

## 2013-07-26 NOTE — Progress Notes (Signed)
2D Echo Performed 07/26/2013    Thiago Ragsdale, RCS  

## 2013-09-29 NOTE — Telephone Encounter (Signed)
CLOSED ENCOUNTER BY TINA P 

## 2014-02-24 ENCOUNTER — Other Ambulatory Visit: Payer: Self-pay | Admitting: Internal Medicine

## 2014-02-24 NOTE — Telephone Encounter (Signed)
Rx was sent to pharmacy electronically. 

## 2015-10-20 ENCOUNTER — Other Ambulatory Visit: Payer: Self-pay | Admitting: Internal Medicine

## 2015-10-23 NOTE — Telephone Encounter (Signed)
Rx request sent to pharmacy.  

## 2015-11-02 ENCOUNTER — Other Ambulatory Visit: Payer: Self-pay

## 2015-11-02 MED ORDER — METOPROLOL TARTRATE 50 MG PO TABS: 25.0000 mg | ORAL_TABLET | Freq: Two times a day (BID) | ORAL | Status: DC

## 2015-11-02 NOTE — Telephone Encounter (Signed)
Pharmacy sent over a fax saying Metoprol Tar 25mg  is on back order and we need change med to metoprolol 50mg ; Ok per [place] that I can increase dosage of metoprolol

## 2018-11-02 ENCOUNTER — Encounter (HOSPITAL_COMMUNITY)
Admission: EM | Disposition: A | Discharge status: Home / Self Care | Payer: Self-pay | Source: Home / Self Care | Attending: Emergency Medicine

## 2018-11-02 ENCOUNTER — Emergency Department (HOSPITAL_COMMUNITY)
Admission: EM | Admit: 2018-11-02 | Discharge: 2018-11-02 | Disposition: A | Discharge status: Home / Self Care | Payer: Medicare Other | Attending: Emergency Medicine | Admitting: Emergency Medicine

## 2018-11-02 ENCOUNTER — Other Ambulatory Visit: Payer: Self-pay

## 2018-11-02 ENCOUNTER — Encounter (HOSPITAL_COMMUNITY): Payer: Self-pay | Admitting: *Deleted

## 2018-11-02 DIAGNOSIS — Y999 Unspecified external cause status: Secondary | ICD-10-CM | POA: Insufficient documentation

## 2018-11-02 DIAGNOSIS — Y929 Unspecified place or not applicable: Secondary | ICD-10-CM | POA: Insufficient documentation

## 2018-11-02 DIAGNOSIS — T18108A Unspecified foreign body in esophagus causing other injury, initial encounter: Secondary | ICD-10-CM | POA: Diagnosis present

## 2018-11-02 DIAGNOSIS — Z79899 Other long term (current) drug therapy: Secondary | ICD-10-CM | POA: Diagnosis not present

## 2018-11-02 DIAGNOSIS — Z8673 Personal history of transient ischemic attack (TIA), and cerebral infarction without residual deficits: Secondary | ICD-10-CM | POA: Diagnosis not present

## 2018-11-02 DIAGNOSIS — Y939 Activity, unspecified: Secondary | ICD-10-CM | POA: Insufficient documentation

## 2018-11-02 DIAGNOSIS — Z7982 Long term (current) use of aspirin: Secondary | ICD-10-CM | POA: Diagnosis not present

## 2018-11-02 DIAGNOSIS — Z86718 Personal history of other venous thrombosis and embolism: Secondary | ICD-10-CM | POA: Diagnosis not present

## 2018-11-02 DIAGNOSIS — Z86711 Personal history of pulmonary embolism: Secondary | ICD-10-CM | POA: Insufficient documentation

## 2018-11-02 DIAGNOSIS — Z885 Allergy status to narcotic agent status: Secondary | ICD-10-CM | POA: Diagnosis not present

## 2018-11-02 DIAGNOSIS — Z1159 Encounter for screening for other viral diseases: Secondary | ICD-10-CM | POA: Insufficient documentation

## 2018-11-02 DIAGNOSIS — K222 Esophageal obstruction: Secondary | ICD-10-CM | POA: Insufficient documentation

## 2018-11-02 DIAGNOSIS — Z87891 Personal history of nicotine dependence: Secondary | ICD-10-CM | POA: Diagnosis not present

## 2018-11-02 DIAGNOSIS — X58XXXA Exposure to other specified factors, initial encounter: Secondary | ICD-10-CM | POA: Insufficient documentation

## 2018-11-02 HISTORY — PX: FOREIGN BODY REMOVAL: SHX962

## 2018-11-02 HISTORY — PX: BIOPSY: SHX5522

## 2018-11-02 HISTORY — PX: ESOPHAGOGASTRODUODENOSCOPY: SHX5428

## 2018-11-02 LAB — CBG MONITORING, ED: Glucose-Capillary: 74 mg/dL (ref 70–99)

## 2018-11-02 LAB — SARS CORONAVIRUS 2 BY RT PCR (HOSPITAL ORDER, PERFORMED IN ~~LOC~~ HOSPITAL LAB): SARS Coronavirus 2: NEGATIVE

## 2018-11-02 SURGERY — EGD (ESOPHAGOGASTRODUODENOSCOPY)
Anesthesia: Moderate Sedation

## 2018-11-02 MED ORDER — BUTAMBEN-TETRACAINE-BENZOCAINE 2-2-14 % EX AERO
INHALATION_SPRAY | CUTANEOUS | Status: DC | PRN
  Administered 2018-11-02: 2 via TOPICAL

## 2018-11-02 MED ORDER — MIDAZOLAM HCL (PF) 10 MG/2ML IJ SOLN
INTRAMUSCULAR | Status: DC | PRN
  Administered 2018-11-02: 2 mg via INTRAVENOUS
  Administered 2018-11-02 (×2): 1 mg via INTRAVENOUS
  Administered 2018-11-02: 2 mg via INTRAVENOUS

## 2018-11-02 MED ORDER — MIDAZOLAM HCL (PF) 5 MG/ML IJ SOLN
INTRAMUSCULAR | Status: AC
  Filled 2018-11-02: qty 2

## 2018-11-02 MED ORDER — DIPHENHYDRAMINE HCL 50 MG/ML IJ SOLN
INTRAMUSCULAR | Status: AC
  Filled 2018-11-02: qty 1

## 2018-11-02 MED ORDER — SODIUM CHLORIDE 0.9 % IV SOLN: INTRAVENOUS | Status: DC

## 2018-11-02 MED ORDER — FENTANYL CITRATE (PF) 100 MCG/2ML IJ SOLN
INTRAMUSCULAR | Status: DC | PRN
  Administered 2018-11-02 (×4): 25 ug via INTRAVENOUS

## 2018-11-02 MED ORDER — FENTANYL CITRATE (PF) 100 MCG/2ML IJ SOLN
INTRAMUSCULAR | Status: AC
  Filled 2018-11-02: qty 4

## 2018-11-02 NOTE — ED Notes (Signed)
Got patient into a gown on the monitor patient is resting with call bell in reach also got patient a warm blanket

## 2018-11-02 NOTE — ED Notes (Signed)
IV removed.

## 2018-11-02 NOTE — Discharge Instructions (Addendum)
You will need to schedule a follow-up appointment with with our GI in regards to your visit to the emergency department today.    There were biopsies that were taken of your esophagus and you will need to follow-up with him about the results.    Return to the emergency department for any new or worsening symptoms in the meantime including difficulty drinking or eating, difficulty swallowing your saliva, throat pain, voice changes, fevers.

## 2018-11-02 NOTE — ED Provider Notes (Addendum)
[facility] EMERGENCY DEPARTMENT Provider Note   CSN: [id number] Arrival date & time: 11/02/18  1235    History   Chief Complaint Chief Complaint  Patient presents with  . Sore Throat    foreign object in throat    HPI [name] is a 78 y.o. female.     HPI   Patient is a 78 year old female with a history of cardiac murmur, CVA, DVT/PE, talk with Sobo syndrome, V. fib, who presents to the emergency department today for evaluation of foreign body sensation in throat since last night after eating steak. Pt states the FB sensation is "near my Adam's apple". States she has not been able to eat or drink since this occurred. States she has had to spit out her secretions periodically because they build up in your throat. Denies any difficulty breathing. No voices changes.   She has never had this before. Has had barium swallow in that past. No h/o esophageal dilation.   Patient was seen at [facility] urgent care and [place] prior to arrival and was evaluated by x-ray which was negative for radiopaque foreign body.  Past Medical History:  Diagnosis Date  . Cardiac murmur    Echo, EF-55-60, mild tricuspid regurgitation  . CVA (cerebral vascular accident) (HCC) 1991  . DVT (deep venous thrombosis) (HCC)   . PE (pulmonary embolism)   . 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 SOB (shortness of breath) 06/16/2013  . 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 DVT (deep venous thrombosis) (HCC) 11/26/2012  . Acute pulmonary embolism (HCC) 11/26/2012  . DOE (dyspnea on exertion) 11/26/2012  . Stress incontinence 11/26/2012    Past Surgical History:  Procedure Laterality Date  . CARDIAC CATHETERIZATION  09/08/2009   EF-45,  Free of disease     OB History   No obstetric history on file.      Home Medications    Prior to Admission medications   Medication Sig Start Date End Date Taking? Authorizing Provider  aspirin 81 MG tablet Take 81 mg by mouth daily.   Yes [provider]  bumetanide (BUMEX) 2 MG tablet Take 2 mg by mouth daily. 10/08/12  Yes [provider]  buPROPion (WELLBUTRIN XL) 300 MG 24 hr tablet Take 300 mg by mouth daily. 01/20/17  Yes [provider]  calcium-vitamin D (OSCAL WITH D) 500-200 MG-UNIT tablet Take 1 tablet by mouth 2 (two) times daily.   Yes [provider]  Cholecalciferol (VITAMIN D3) 3000 UNITS TABS Take 3,000 Units by mouth daily.   Yes [provider]  gabapentin (NEURONTIN) 300 MG capsule Take 900 mg by mouth daily. 07/02/12  Yes [provider]  indomethacin (INDOCIN) 50 MG capsule Take 50 mg by mouth 3 (three) times daily with meals. 02/20/18 02/20/19 Yes [provider]  metoprolol tartrate (LOPRESSOR) 25 MG tablet Take 25 mg by mouth 2 (two) times a day. 02/23/10  Yes [provider]  Multiple Vitamins-Minerals (CENTRUM SILVER PO) Take 1 tablet by mouth daily.   Yes [provider]  mupirocin ointment (BACTROBAN) 2 % Apply 1 application topically as needed (abrasions).  07/20/15  Yes [provider]  pantoprazole (PROTONIX) 40 MG tablet Take 40 mg by mouth daily.   Yes  [provider]  metoprolol (LOPRESSOR) 50 MG tablet Take 0.5 tablets (25 mg total) by mouth 2 (two) times daily. Patient not taking: Reported on 11/02/2018 11/02/15   Chrystie Nose, MD    Family History Family History  Problem Relation Age of Onset  . Heart Problems Brother   . Heart Problems Sister   . Glaucoma Sister     Social History Social History   Tobacco Use  . Smoking status: Former Smoker    Packs/day: 1.50    Years: 51.00    Pack years: 76.50    Types: Cigarettes    Last attempt to quit:  06/10/2006    Years since quitting: 12.4  . Smokeless tobacco: Never Used  Substance Use Topics  . Alcohol use: No  . Drug use: No     Allergies   Codeine and Morphine and related   Review of Systems Review of Systems  Constitutional: Negative for fever.  HENT: Negative for ear pain and sore throat.        FB sensation in throat, unable to tolerate PO or secretions  Eyes: Negative for visual disturbance.  Respiratory: Negative for cough and shortness of breath.   Cardiovascular: Negative for chest pain and palpitations.  Gastrointestinal: Positive for vomiting. Negative for abdominal pain, constipation, diarrhea and nausea.  Genitourinary: Negative for dysuria and flank pain.  Musculoskeletal: Negative for back pain.  Skin: Negative for rash.  Neurological: Negative for headaches.  All other systems reviewed and are negative.  Physical Exam Updated Vital Signs BP 92/68 (BP Location: Right Arm)   Pulse 72   Temp 98.4 F (36.9 C) (Oral)   Resp 16   Ht  (1.626 m)   Wt 90.7 kg   SpO2 100%   BMI 34.33 kg/m   Physical Exam Vitals signs and nursing note reviewed.  Constitutional:      General: She is not in acute distress.    Appearance: She is well-developed. She is not ill-appearing or toxic-appearing.  HENT:     Head: Normocephalic and atraumatic.     Mouth/Throat:     Mouth: Mucous membranes are moist.     Pharynx: No oropharyngeal exudate or posterior oropharyngeal erythema.  Eyes:     Conjunctiva/sclera: Conjunctivae normal.  Neck:     Musculoskeletal: Neck supple.  Cardiovascular:     Rate and Rhythm: Normal rate and regular rhythm.     Heart sounds: Normal heart sounds. No murmur.  Pulmonary:     Effort: Pulmonary effort is normal. No respiratory distress.     Breath sounds: Normal breath sounds. No wheezing, rhonchi or rales.  Abdominal:     General: Bowel sounds are normal. There is no distension.     Palpations: Abdomen is soft.     Tenderness:  There is no abdominal tenderness. There is no guarding or rebound.  Skin:    General: Skin is warm and dry.  Neurological:     Mental Status: She is alert.      ED Treatments / Results  Labs (all labs ordered are listed, but only abnormal results are displayed) Labs Reviewed  SARS CORONAVIRUS 2 (HOSPITAL ORDER, PERFORMED IN [place] [facility])  CBG MONITORING, ED  SURGICAL PATHOLOGY    EKG None  Radiology No results found.  Procedures Procedures (including critical care time)  Medications Ordered in ED Medications - No data to display   Initial Impression / Assessment and Plan / ED Course  I have reviewed the  triage vital signs and the nursing notes.  Pertinent labs & imaging results that were available during my care of the patient were reviewed by me and considered in my medical decision making (see chart for details).  Clinical Course as of Nov 02 1823  Mon Nov 02, 2018  1320 Patient is complaining of foreign body sensation in her throat since eating steak yesterday.  She is unable to swallow any food since then and has difficulty swallowing her secretions.  Sometimes she is retches them back up.  She feels it in her lower neck.  She has had this happen before but has never required endoscopy.  She has normal phonation.   [MB]  1633 Endoscopy is setting up in the room to do a bedside endoscopy.   [MB]    Clinical Course User Index [MB] Terrilee FilesButler, Michael C, MD     Final Clinical Impressions(s) / ED Diagnoses   Final diagnoses:  Foreign body in esophagus, initial encounter   Patient is a 78 year old female who presents for evaluation of foreign body sensation in throat since last night after eating steak. Has not been able to tolerate PO or her own secretions. No difficulty breathing or speaking.   Vitals normal with normal sats. Pt in no distress.   [facility] and had xray which was negative for radioopaque FB.   1:45 PM CONSULT with Dr. Elnoria HowardHung with  GI who will see the patient in the ED to perform EGD.  COVID testing ordered in anticipation of procedure which was negative.   4:02 PM text page sent to GI indicating negative COVID testing.  Update from Dr. Elnoria HowardHung. EGD completed and food bolus was dislodged from esophagus.   6:24 PM discussed patient.  She states she feels much better.  She is alert and oriented x4.  She has been able to tolerate water without difficulty.  She is able to tolerate her secretions.  I discussed the plan for follow-up with GI to follow-up about her visit here and about her biopsy.  Advised her to return to the ER for new or worsening symptoms in the meantime.  She voices understanding the plan reasons return. all Questions answered.  Patient stable for discharge.  ED Discharge Orders    None       Karrie MeresCouture, Dequane Strahan S, PA-C 11/02/18 1825    Karrie MeresCouture, Hadasa Gasner S, PA-C 11/02/18 1825    Terrilee FilesButler, Michael C, MD 11/03/18 1150

## 2018-11-02 NOTE — ED Triage Notes (Signed)
PT sent here by Dr Lorenz Coaster from [facility] in [place].  States she feels a foreign object in throat since eating steak last night.  X-ray neg.

## 2018-11-02 NOTE — ED Notes (Signed)
Patient verbalizes understanding of discharge instructions. Opportunity for questioning and answers were provided. Armband removed by staff, pt discharged from ED.  
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
## 2018-11-02 NOTE — Op Note (Signed)
[facility] Patient Name: [name] Procedure Date : 11/02/2018 MRN: [id number] Attending MD: Jeani Hawking , MD Date of Birth: [birth year] CSN: [id number] Age: 78 Admit Type: Emergency Department Procedure:                Upper GI endoscopy Indications:              Dysphagia Providers:                Jeani Hawking, MD, Clearnce Sorrel, RN, Lawson Radar,                            Technician Referring MD:              Medicines:                Fentanyl 100 micrograms IV, Midazolam 5 mg IV Complications:            No immediate complications. Estimated Blood Loss:     Estimated blood loss was minimal. Procedure:                Pre-Anesthesia Assessment:                           - Prior to the procedure, a History and Physical                            was performed, and patient medications and                            allergies were reviewed. The patient's tolerance of                            previous anesthesia was also reviewed. The risks                            and benefits of the procedure and the sedation                            options and risks were discussed with the patient.                            All questions were answered, and informed consent                            was obtained. Prior Anticoagulants: The patient has                            taken no previous anticoagulant or antiplatelet                            agents. ASA Grade Assessment: III - A patient with                            severe systemic disease. After reviewing the risks  and benefits, the patient was deemed in                            satisfactory condition to undergo the procedure.                           - Sedation was administered by an endoscopy nurse.                            The sedation level attained was moderate.                           After obtaining informed consent, the endoscope was                            passed  under direct vision. Throughout the                            procedure, the patient's blood pressure, pulse, and                            oxygen saturations were monitored continuously. The                            GIF-H190 ([id number]) Olympus gastroscope was                            introduced through the mouth, and advanced to the                            second part of duodenum. The upper GI endoscopy was                            technically difficult and complex. The patient                            tolerated the procedure well. Scope In: Scope Out: Findings:      Food was found in the upper third of the esophagus. Removal of food was       accomplished.      One benign-appearing, intrinsic moderate stenosis was found 20 cm from       the incisors. This stenosis measured 1.2 cm (inner diameter) x 1 cm (in       length). The stenosis was traversed. Biopsies were taken with a cold       forceps for histology.      The stomach was normal.      The examined duodenum was normal.      After adequate sedation was achieved the endoscope was advanced through       the UES and the meat bolus was immediately encountered. A stable       position was obtained with gentle pressure, an attempt was made to push       the bolus through the stricture. This initial attempt failed and then       the Talon graspers were employed to help maneuver the  bolus through the       stricture. Visualization beyond the lumen was obtained when the meat       bolus was compressed against the esophageal wall before using the Talon       grasper. As a result of the compression of the meat bolus, it was able       to be pushed distally past the stricture and into the gastric lumen. No       other overt abnormalities were noted in the upper GI tract. Reinspection       of the stenosis was performed and the mucosa was abnormal. There was no       obvious evidence of any malignancy, but there were white  punctate       lesions in the area. This finding was not a Candidal esophagitis. ? HPV.       Multiple biopsies were obtained of the area. Impression:               - Food in the upper third of the esophagus. Removal                            was successful.                           - Benign-appearing esophageal stenosis. Biopsied.                           - Normal stomach.                           - Normal examined duodenum. Recommendation:           - Patient has a contact number available for                            emergencies. The signs and symptoms of potential                            delayed complications were discussed with the                            patient. Return to normal activities tomorrow.                            Written discharge instructions were provided to the                            patient.                           - Mechanical soft diet.                           - Continue present medications.                           - Await pathology results.                           -  Follow up with [facility] for further treatment                            and work up. Procedure Code(s):        --- Professional ---                           [id number], Esophagogastroduodenoscopy, flexible,                            transoral; with removal of foreign body(s)                           43239, Esophagogastroduodenoscopy, flexible,                            transoral; with biopsy, single or multiple Diagnosis Code(s):        --- Professional ---                           U04.540JT18.128A, Food in esophagus causing other injury,                            initial encounter                           K22.2, Esophageal obstruction                           R13.10, Dysphagia, unspecified CPT copyright 2019 American Medical Association. All rights reserved. The codes documented in this report are preliminary and upon coder review may  be revised to meet current compliance  requirements. Jeani HawkingPatrick Calvin Chura, MD Jeani HawkingPatrick Jenavieve Freda, MD 11/02/2018 5:40:21 PM This report has been signed electronically. Number of Addenda: 0

## 2018-11-04 ENCOUNTER — Encounter (HOSPITAL_COMMUNITY): Payer: Self-pay | Admitting: Gastroenterology

## 2018-11-11 ENCOUNTER — Telehealth: Payer: Self-pay | Admitting: General Surgery

## 2018-11-11 NOTE — Telephone Encounter (Signed)
Left voicemail on patients mobile and home number to call the office to pre-screen for her televist with Dr Myrtie Neither on 11/13/2018

## 2018-11-12 ENCOUNTER — Encounter: Payer: Self-pay | Admitting: General Surgery

## 2018-11-13 ENCOUNTER — Encounter: Payer: Self-pay | Admitting: Gastroenterology

## 2018-11-13 ENCOUNTER — Other Ambulatory Visit: Payer: Self-pay

## 2018-11-13 ENCOUNTER — Ambulatory Visit (INDEPENDENT_AMBULATORY_CARE_PROVIDER_SITE_OTHER): Payer: Medicare Other | Admitting: Gastroenterology

## 2018-11-13 VITALS — Ht 64.0 in | Wt 198.0 lb

## 2018-11-13 DIAGNOSIS — K222 Esophageal obstruction: Secondary | ICD-10-CM

## 2018-11-13 DIAGNOSIS — T18128A Food in esophagus causing other injury, initial encounter: Secondary | ICD-10-CM

## 2018-11-13 NOTE — Progress Notes (Signed)
This patient contacted our office requesting a physician telemedicine consultation regarding clinical questions and/or test results. Interactive audio and video telecommunications were attempted between this provider and the patient.  However, this technology failed due to the patient having technical difficulties OR they did not have access to video capabilities.  We continued and completed the visit with audio only.  If new patient, they were referred by ? Emergency department  Participants on the conference : myself and patient   The patient consented to this consultation and was aware that a charge will be placed through their insurance.  I was in my office and the patient was at home   Encounter time:  Total time 5 minutes, all spent on phone   _____________________________________________________________________________________________            [facility] Consult Note:  History: [name] 11/13/2018  Referring provider: ED provider  Reason for consult/chief complaint: Hospitalization Follow-up (doing well )  (Patient not previously seen by this practice)  Subjective  HPI: Came to emergency department May 25 with esophageal food impaction.  Dr. Jeani Hawking performed upper endoscopy, the report of which was reviewed today.  She had a stenosis just below the UES, with a meat impaction above it.  He was able to gently push the impaction through the stenosis into the more distal esophagus and then on into the stomach.  There was a mucosal abnormality of the stenotic area, biopsies were taken.     This patient reports she has had some occasional dysphagia past, usually when she eats too quickly.  She felt that was the case the day of this impaction.  She does not have a [place] primary care provider, and has never been seen by [facility].  It is not entirely clear why she was then referred to a new GI practice after the above episode.  She says there was  difficulty contacting Dr. Haywood Pao office, and the emergency department paperwork seem to have our number on it.  Having not done the upper endoscopy, it is very difficult for me to determine whether this was a chronic stricture and whether or not it needs further attention such as repeat ended with dilation.  She was an unassigned patient to the emergency department seen by a provider at another practice.  My office will contact Dr. Haywood Pao office and asked him to make arrangements to follow-up with this patient.  Amada Jupiter, MD [facility]

## 2018-11-16 ENCOUNTER — Telehealth: Payer: Self-pay | Admitting: Gastroenterology

## 2018-11-16 NOTE — Telephone Encounter (Signed)
I received a message from Dr. Benson Norway that he would like the patient to follow up with out office.  When I spoke wit [name] briefly last week, she mentioned intermittent trouble swallowing for some time leading up to the recent food impaction.  Please contact her with the following advice:  Barium swallow for dysphagia - rule out stricture  Then new patient telephone visit with me several days to a week later.  - HD

## 2018-11-18 NOTE — Telephone Encounter (Signed)
Left message to return call 

## 2018-11-20 NOTE — Telephone Encounter (Signed)
Left message to return call 

## 2018-11-23 ENCOUNTER — Other Ambulatory Visit: Payer: Self-pay

## 2018-11-23 DIAGNOSIS — R1310 Dysphagia, unspecified: Secondary | ICD-10-CM

## 2018-11-23 DIAGNOSIS — K222 Esophageal obstruction: Secondary | ICD-10-CM

## 2018-11-23 NOTE — Telephone Encounter (Signed)
Thanks for the update.  I will be on hospital service the following week, so please arrange telemedicine with me the week of July 6th.

## 2018-11-23 NOTE — Telephone Encounter (Signed)
Follow up scheduled for 12-16-2018 @ 130pm

## 2018-11-23 NOTE — Telephone Encounter (Signed)
Spoke to the patient and she agrees to have the barium swallow. Scheduled at [facility] for 12-04-2018 @ 10:30am

## 2018-12-04 ENCOUNTER — Other Ambulatory Visit: Payer: Self-pay

## 2018-12-04 ENCOUNTER — Ambulatory Visit (HOSPITAL_COMMUNITY)
Admission: RE | Admit: 2018-12-04 | Discharge: 2018-12-04 | Disposition: A | Discharge status: Home / Self Care | Payer: Medicare Other | Source: Ambulatory Visit | Attending: Gastroenterology | Admitting: Gastroenterology

## 2018-12-04 DIAGNOSIS — T18128A Food in esophagus causing other injury, initial encounter: Secondary | ICD-10-CM | POA: Diagnosis present

## 2018-12-04 DIAGNOSIS — K222 Esophageal obstruction: Secondary | ICD-10-CM | POA: Diagnosis present

## 2018-12-04 DIAGNOSIS — R1310 Dysphagia, unspecified: Secondary | ICD-10-CM | POA: Insufficient documentation

## 2018-12-16 ENCOUNTER — Other Ambulatory Visit: Payer: Self-pay

## 2018-12-16 ENCOUNTER — Ambulatory Visit (INDEPENDENT_AMBULATORY_CARE_PROVIDER_SITE_OTHER): Payer: Medicare Other | Admitting: Gastroenterology

## 2018-12-16 ENCOUNTER — Encounter: Payer: Self-pay | Admitting: Gastroenterology

## 2018-12-16 DIAGNOSIS — R1319 Other dysphagia: Secondary | ICD-10-CM

## 2018-12-16 DIAGNOSIS — R1310 Dysphagia, unspecified: Secondary | ICD-10-CM

## 2018-12-16 NOTE — Progress Notes (Signed)
This patient contacted our office requesting a physician telemedicine consultation regarding clinical questions and/or test results. Due to COVID restrictions, this was felt to be the most appropriate method of patient evaluation. If new patient, they were referred by [facility] emergency department   Patient did not answer home or mobile phone multiple times     _____________________________________________________________________________________________            [name] [facility] Consult Note:  History: [name] 12/16/2018  Referring provider: Cathlean Sauer, MD  Reason for consult/chief complaint: No chief complaint on file.   Subjective  HPI: Recent emergency department visit for esophageal food impaction.  Upper endoscopy performed by Dr. Carol Ada.  He felt there was inflammation or possible stricture in the proximal esophagus, and then she was referred to [place] for outpatient follow-up.   ROS:  Review of Systems   Past Medical History: Past Medical History:  Diagnosis Date  . Cardiac murmur    Echo, EF-55-60, mild tricuspid regurgitation  . CVA (cerebral vascular accident) ([place]) 1991  . DVT (deep venous thrombosis) ([place])   . PE (pulmonary embolism)   . 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 BIOPSY  11/02/2018   Procedure: BIOPSY;  Surgeon: Carol Ada, MD;  Location: [facility];  Service: Endoscopy;;  . CARDIAC CATHETERIZATION  09/08/2009   EF-45, Free of disease  . ESOPHAGOGASTRODUODENOSCOPY N/A 11/02/2018   Procedure: ESOPHAGOGASTRODUODENOSCOPY (EGD);  Surgeon: Carol Ada, MD;  Location: [facility];  Service: Endoscopy;  Laterality: N/A;  . FOREIGN BODY REMOVAL  11/02/2018   Procedure: FOREIGN BODY  REMOVAL;  Surgeon: Carol Ada, MD;  Location: [facility] ENDOSCOPY;  Service: Endoscopy;;     Family History: Family History  Problem Relation Age of Onset  . Heart disease Mother   . Heart disease Father   . Heart disease Brother   . Heart disease Sister   . Heart Problems Brother   . Diabetes Brother     Social History: Social History   Socioeconomic History  . Marital status: Widowed    Spouse name: Not on file  . Number of children: 3  . Years of education: Not on file  . Highest education level: Not on file  Occupational History  . Not on file  Social Needs  . Financial resource strain: Not on file  . Food insecurity    Worry: Not on file    Inability: Not on file  . Transportation needs    Medical: Not on file    Non-medical: Not on file  Tobacco Use  . Smoking status: Former Smoker    Packs/day: 1.50    Years: 51.00    Pack years: 76.50    Types: Cigarettes    Quit date: 06/10/2006    Years since quitting: 12.5  . Smokeless tobacco: Never Used  Substance and Sexual Activity  . Alcohol use: No  . Drug use: No  . Sexual activity: Not on file  Lifestyle  . Physical activity    Days per week: Not on file    Minutes per session: Not on file  . Stress: Not on file  Relationships  . Social Herbalist on phone: Not on file    Gets together: Not on file    Attends religious service:  Not on file    Active member of club or organization: Not on file    Attends meetings of clubs or organizations: Not on file    Relationship status: Not on file  Other Topics Concern  . Not on file  Social History Narrative  . Not on file    Allergies: Allergies  Allergen Reactions  . Codeine   . Morphine And Related Other (See Comments)    confusion    Outpatient Meds: Current Outpatient Medications  Medication Sig Dispense Refill  . aspirin 81 MG tablet Take 81 mg by mouth daily.    . bumetanide (BUMEX) 2 MG tablet Take 2 mg by mouth daily.    Marland Kitchen. buPROPion  (WELLBUTRIN XL) 300 MG 24 hr tablet Take 300 mg by mouth daily.    . calcium-vitamin D (OSCAL WITH D) 500-200 MG-UNIT tablet Take 1 tablet by mouth 2 (two) times daily.    . Cholecalciferol (VITAMIN D3) 3000 UNITS TABS Take 3,000 Units by mouth daily.    Marland Kitchen. gabapentin (NEURONTIN) 300 MG capsule Take 900 mg by mouth daily.    . indomethacin (INDOCIN) 50 MG capsule Take 50 mg by mouth 3 (three) times daily with meals.    . metoprolol (LOPRESSOR) 50 MG tablet Take 0.5 tablets (25 mg total) by mouth 2 (two) times daily. 180 tablet 3  . metoprolol tartrate (LOPRESSOR) 25 MG tablet Take 25 mg by mouth 2 (two) times a day.    . Multiple Vitamins-Minerals (CENTRUM SILVER PO) Take 1 tablet by mouth daily.    . mupirocin ointment (BACTROBAN) 2 % Apply 1 application topically as needed (abrasions).     . pantoprazole (PROTONIX) 40 MG tablet Take 40 mg by mouth daily.     No current facility-administered medications for this visit.       ___________________________________________________________________ Objective    Radiologic Studies:  CLINICAL DATA:  Esophageal food impaction, dysphagia, chronic choking   EXAM: ESOPHOGRAM / BARIUM SWALLOW / BARIUM TABLET STUDY   TECHNIQUE: Combined double contrast and single contrast examination performed using effervescent crystals, thick barium liquid, and thin barium liquid. The patient was observed with fluoroscopy swallowing a 13 mm barium sulphate tablet.   FLUOROSCOPY TIME:  Fluoroscopy Time:  1 minutes 42 seconds   Radiation Exposure Index (if provided by the fluoroscopic device): 33 mGy   Number of Acquired Spot Images: 9   COMPARISON:  None.   FINDINGS: No evidence of tracheobronchial aspiration.   Normal esophageal peristalsis. No fixed esophageal narrowing or stricture. A 13 mm barium tablet passed into the stomach without delay.   Tiny hiatal hernia on prone swallows.   Patient could not be assessed for gastroesophageal reflux  due to the inability to clear the esophagus in the prone position   IMPRESSION: Tiny hiatal hernia.   Otherwise negative double-contrast barium esophagram.     Electronically Signed   By: Charline BillsSriyesh  Krishnan M.D.   On: 12/04/2018 11:19    _____________________________________________________   Recent EGD report reviewed.  Assessment: Encounter Diagnosis  Name Primary?  . Esophageal dysphagia Yes   No stricture  Plan:  Patient will be given test results over phone by staff.  No apparent need for EGD at present. See me in person as needed.  Thank you for the courtesy of this consult.  Please call me with any questions or concerns.  Charlie PitterHenry L Danis III  CC: Referring provider noted above

## 2023-01-04 ENCOUNTER — Inpatient Hospital Stay (HOSPITAL_COMMUNITY)
Admission: EM | Admit: 2023-01-04 | Discharge: 2023-01-30 | DRG: 208 | Disposition: A | Discharge status: Skilled Nursing Facility | Payer: Medicare Other | Attending: Internal Medicine | Admitting: Internal Medicine

## 2023-01-04 ENCOUNTER — Emergency Department (HOSPITAL_COMMUNITY): Payer: Medicare Other

## 2023-01-04 ENCOUNTER — Encounter (HOSPITAL_COMMUNITY): Payer: Self-pay

## 2023-01-04 ENCOUNTER — Other Ambulatory Visit: Payer: Self-pay

## 2023-01-04 DIAGNOSIS — J9811 Atelectasis: Secondary | ICD-10-CM | POA: Diagnosis not present

## 2023-01-04 DIAGNOSIS — Z66 Do not resuscitate: Secondary | ICD-10-CM | POA: Diagnosis present

## 2023-01-04 DIAGNOSIS — I480 Paroxysmal atrial fibrillation: Secondary | ICD-10-CM

## 2023-01-04 DIAGNOSIS — E785 Hyperlipidemia, unspecified: Secondary | ICD-10-CM | POA: Diagnosis present

## 2023-01-04 DIAGNOSIS — E1140 Type 2 diabetes mellitus with diabetic neuropathy, unspecified: Secondary | ICD-10-CM | POA: Diagnosis present

## 2023-01-04 DIAGNOSIS — L0390 Cellulitis, unspecified: Secondary | ICD-10-CM

## 2023-01-04 DIAGNOSIS — Z87891 Personal history of nicotine dependence: Secondary | ICD-10-CM

## 2023-01-04 DIAGNOSIS — Z79899 Other long term (current) drug therapy: Secondary | ICD-10-CM

## 2023-01-04 DIAGNOSIS — E1165 Type 2 diabetes mellitus with hyperglycemia: Secondary | ICD-10-CM | POA: Diagnosis not present

## 2023-01-04 DIAGNOSIS — I9589 Other hypotension: Secondary | ICD-10-CM | POA: Diagnosis not present

## 2023-01-04 DIAGNOSIS — E874 Mixed disorder of acid-base balance: Secondary | ICD-10-CM | POA: Diagnosis not present

## 2023-01-04 DIAGNOSIS — E876 Hypokalemia: Secondary | ICD-10-CM | POA: Diagnosis not present

## 2023-01-04 DIAGNOSIS — F32A Depression, unspecified: Secondary | ICD-10-CM | POA: Diagnosis present

## 2023-01-04 DIAGNOSIS — J690 Pneumonitis due to inhalation of food and vomit: Secondary | ICD-10-CM | POA: Diagnosis not present

## 2023-01-04 DIAGNOSIS — I5033 Acute on chronic diastolic (congestive) heart failure: Secondary | ICD-10-CM | POA: Diagnosis not present

## 2023-01-04 DIAGNOSIS — I4891 Unspecified atrial fibrillation: Secondary | ICD-10-CM | POA: Diagnosis not present

## 2023-01-04 DIAGNOSIS — J9621 Acute and chronic respiratory failure with hypoxia: Secondary | ICD-10-CM

## 2023-01-04 DIAGNOSIS — I6930 Unspecified sequelae of cerebral infarction: Secondary | ICD-10-CM

## 2023-01-04 DIAGNOSIS — I5043 Acute on chronic combined systolic (congestive) and diastolic (congestive) heart failure: Secondary | ICD-10-CM | POA: Diagnosis not present

## 2023-01-04 DIAGNOSIS — Z86718 Personal history of other venous thrombosis and embolism: Secondary | ICD-10-CM

## 2023-01-04 DIAGNOSIS — L03116 Cellulitis of left lower limb: Secondary | ICD-10-CM | POA: Diagnosis present

## 2023-01-04 DIAGNOSIS — J90 Pleural effusion, not elsewhere classified: Secondary | ICD-10-CM | POA: Diagnosis not present

## 2023-01-04 DIAGNOSIS — Z86711 Personal history of pulmonary embolism: Secondary | ICD-10-CM

## 2023-01-04 DIAGNOSIS — I4819 Other persistent atrial fibrillation: Secondary | ICD-10-CM | POA: Diagnosis present

## 2023-01-04 DIAGNOSIS — I081 Rheumatic disorders of both mitral and tricuspid valves: Secondary | ICD-10-CM | POA: Diagnosis present

## 2023-01-04 DIAGNOSIS — Z833 Family history of diabetes mellitus: Secondary | ICD-10-CM

## 2023-01-04 DIAGNOSIS — J9622 Acute and chronic respiratory failure with hypercapnia: Secondary | ICD-10-CM | POA: Diagnosis present

## 2023-01-04 DIAGNOSIS — J189 Pneumonia, unspecified organism: Secondary | ICD-10-CM | POA: Diagnosis not present

## 2023-01-04 DIAGNOSIS — Z1152 Encounter for screening for COVID-19: Secondary | ICD-10-CM | POA: Diagnosis not present

## 2023-01-04 DIAGNOSIS — F419 Anxiety disorder, unspecified: Secondary | ICD-10-CM | POA: Diagnosis present

## 2023-01-04 DIAGNOSIS — J9601 Acute respiratory failure with hypoxia: Secondary | ICD-10-CM | POA: Diagnosis not present

## 2023-01-04 DIAGNOSIS — T17908A Unspecified foreign body in respiratory tract, part unspecified causing other injury, initial encounter: Secondary | ICD-10-CM

## 2023-01-04 DIAGNOSIS — I509 Heart failure, unspecified: Secondary | ICD-10-CM

## 2023-01-04 DIAGNOSIS — I5021 Acute systolic (congestive) heart failure: Secondary | ICD-10-CM | POA: Diagnosis not present

## 2023-01-04 DIAGNOSIS — I2729 Other secondary pulmonary hypertension: Secondary | ICD-10-CM | POA: Diagnosis not present

## 2023-01-04 DIAGNOSIS — Z6834 Body mass index (BMI) 34.0-34.9, adult: Secondary | ICD-10-CM

## 2023-01-04 DIAGNOSIS — N179 Acute kidney failure, unspecified: Secondary | ICD-10-CM | POA: Diagnosis not present

## 2023-01-04 DIAGNOSIS — I878 Other specified disorders of veins: Secondary | ICD-10-CM | POA: Diagnosis present

## 2023-01-04 DIAGNOSIS — Z7189 Other specified counseling: Secondary | ICD-10-CM | POA: Diagnosis not present

## 2023-01-04 DIAGNOSIS — Z515 Encounter for palliative care: Secondary | ICD-10-CM | POA: Diagnosis not present

## 2023-01-04 DIAGNOSIS — Z7982 Long term (current) use of aspirin: Secondary | ICD-10-CM

## 2023-01-04 DIAGNOSIS — L8990 Pressure ulcer of unspecified site, unspecified stage: Secondary | ICD-10-CM | POA: Insufficient documentation

## 2023-01-04 DIAGNOSIS — L89892 Pressure ulcer of other site, stage 2: Secondary | ICD-10-CM | POA: Diagnosis present

## 2023-01-04 DIAGNOSIS — Z8249 Family history of ischemic heart disease and other diseases of the circulatory system: Secondary | ICD-10-CM

## 2023-01-04 DIAGNOSIS — K5900 Constipation, unspecified: Secondary | ICD-10-CM | POA: Diagnosis present

## 2023-01-04 DIAGNOSIS — I2722 Pulmonary hypertension due to left heart disease: Secondary | ICD-10-CM | POA: Diagnosis present

## 2023-01-04 DIAGNOSIS — I959 Hypotension, unspecified: Secondary | ICD-10-CM

## 2023-01-04 DIAGNOSIS — E871 Hypo-osmolality and hyponatremia: Secondary | ICD-10-CM | POA: Diagnosis not present

## 2023-01-04 DIAGNOSIS — D649 Anemia, unspecified: Secondary | ICD-10-CM | POA: Diagnosis not present

## 2023-01-04 DIAGNOSIS — E669 Obesity, unspecified: Secondary | ICD-10-CM | POA: Diagnosis present

## 2023-01-04 DIAGNOSIS — Z7901 Long term (current) use of anticoagulants: Secondary | ICD-10-CM

## 2023-01-04 DIAGNOSIS — M109 Gout, unspecified: Secondary | ICD-10-CM | POA: Diagnosis present

## 2023-01-04 DIAGNOSIS — J440 Chronic obstructive pulmonary disease with acute lower respiratory infection: Secondary | ICD-10-CM | POA: Diagnosis present

## 2023-01-04 DIAGNOSIS — J9602 Acute respiratory failure with hypercapnia: Secondary | ICD-10-CM | POA: Diagnosis not present

## 2023-01-04 LAB — I-STAT VENOUS BLOOD GAS, ED
Acid-Base Excess: 13 mmol/L — ABNORMAL HIGH (ref 0.0–2.0)
Acid-Base Excess: 10 mmol/L — ABNORMAL HIGH (ref 0.0–2.0)
Bicarbonate: 40.7 mmol/L — ABNORMAL HIGH (ref 20.0–28.0)
Bicarbonate: 35.7 mmol/L — ABNORMAL HIGH (ref 20.0–28.0)
Calcium, Ion: 1.2 mmol/L (ref 1.15–1.40)
Calcium, Ion: 1.09 mmol/L — ABNORMAL LOW (ref 1.15–1.40)
HCT: 41 % (ref 36.0–46.0)
HCT: 40 % (ref 36.0–46.0)
Hemoglobin: 13.9 g/dL (ref 12.0–15.0)
Hemoglobin: 13.6 g/dL (ref 12.0–15.0)
O2 Saturation: 100 %
O2 Saturation: 100 %
Potassium: 5.9 mmol/L — ABNORMAL HIGH (ref 3.5–5.1)
Potassium: 4 mmol/L (ref 3.5–5.1)
Sodium: 137 mmol/L (ref 135–145)
Sodium: 140 mmol/L (ref 135–145)
TCO2: 43 mmol/L — ABNORMAL HIGH (ref 22–32)
TCO2: 37 mmol/L — ABNORMAL HIGH (ref 22–32)
pCO2, Ven: 66.7 mmHg — ABNORMAL HIGH (ref 44–60)
pCO2, Ven: 53.1 mmHg (ref 44–60)
pH, Ven: 7.394 (ref 7.25–7.43)
pH, Ven: 7.436 — ABNORMAL HIGH (ref 7.25–7.43)
pO2, Ven: 191 mmHg — ABNORMAL HIGH (ref 32–45)
pO2, Ven: 180 mmHg — ABNORMAL HIGH (ref 32–45)

## 2023-01-04 LAB — I-STAT ARTERIAL BLOOD GAS, ED
Acid-Base Excess: 10 mmol/L — ABNORMAL HIGH (ref 0.0–2.0)
Bicarbonate: 39.8 mmol/L — ABNORMAL HIGH (ref 20.0–28.0)
Calcium, Ion: 1.38 mmol/L (ref 1.15–1.40)
HCT: 41 % (ref 36.0–46.0)
Hemoglobin: 13.9 g/dL (ref 12.0–15.0)
O2 Saturation: 98 %
Potassium: 4 mmol/L (ref 3.5–5.1)
Sodium: 140 mmol/L (ref 135–145)
TCO2: 42 mmol/L — ABNORMAL HIGH (ref 22–32)
pCO2 arterial: 79.2 mmHg (ref 32–48)
pH, Arterial: 7.31 — ABNORMAL LOW (ref 7.35–7.45)
pO2, Arterial: 113 mmHg — ABNORMAL HIGH (ref 83–108)

## 2023-01-04 LAB — I-STAT CG4 LACTIC ACID, ED
Lactic Acid, Venous: 2 mmol/L (ref 0.5–1.9)
Lactic Acid, Venous: 3.1 mmol/L (ref 0.5–1.9)
Lactic Acid, Venous: 2.1 mmol/L (ref 0.5–1.9)

## 2023-01-04 LAB — COMPREHENSIVE METABOLIC PANEL
ALT: 29 U/L (ref 0–44)
AST: 47 U/L — ABNORMAL HIGH (ref 15–41)
Albumin: 3.1 g/dL — ABNORMAL LOW (ref 3.5–5.0)
Alkaline Phosphatase: 126 U/L (ref 38–126)
Anion gap: 15 (ref 5–15)
BUN: 23 mg/dL (ref 8–23)
CO2: 33 mmol/L — ABNORMAL HIGH (ref 22–32)
Calcium: 10.3 mg/dL (ref 8.9–10.3)
Chloride: 93 mmol/L — ABNORMAL LOW (ref 98–111)
Creatinine, Ser: 0.81 mg/dL (ref 0.44–1.00)
GFR, Estimated: >60 mL/min (ref >60)
Glucose, Bld: 152 mg/dL — ABNORMAL HIGH (ref 70–99)
Potassium: 5.1 mmol/L (ref 3.5–5.1)
Sodium: 141 mmol/L (ref 135–145)
Total Bilirubin: 1.2 mg/dL (ref 0.3–1.2)
Total Protein: 6.6 g/dL (ref 6.5–8.1)

## 2023-01-04 LAB — URINALYSIS, W/ REFLEX TO CULTURE (INFECTION SUSPECTED)
Bilirubin Urine: NEGATIVE
Glucose, UA: NEGATIVE mg/dL
Hgb urine dipstick: NEGATIVE
Ketones, ur: NEGATIVE mg/dL
Nitrite: POSITIVE — AB
Protein, ur: 100 mg/dL — AB
Specific Gravity, Urine: 1.023 (ref 1.005–1.030)
pH: 7 (ref 5.0–8.0)

## 2023-01-04 LAB — CBC WITH DIFFERENTIAL/PLATELET
Abs Immature Granulocytes: 0.07 10*3/uL (ref 0.00–0.07)
Basophils Absolute: 0 10*3/uL (ref 0.0–0.1)
Basophils Relative: 0 %
Eosinophils Absolute: 0 10*3/uL (ref 0.0–0.5)
Eosinophils Relative: 0 %
HCT: 43.8 % (ref 36.0–46.0)
Hemoglobin: 12.7 g/dL (ref 12.0–15.0)
Immature Granulocytes: 1 %
Lymphocytes Relative: 15 %
Lymphs Abs: 1 10*3/uL (ref 0.7–4.0)
MCH: 29.4 pg (ref 26.0–34.0)
MCHC: 29 g/dL — ABNORMAL LOW (ref 30.0–36.0)
MCV: 101.4 fL — ABNORMAL HIGH (ref 80.0–100.0)
Monocytes Absolute: 0.9 10*3/uL (ref 0.1–1.0)
Monocytes Relative: 13 %
Neutro Abs: 4.8 10*3/uL (ref 1.7–7.7)
Neutrophils Relative %: 71 %
Platelets: 286 10*3/uL (ref 150–400)
RBC: 4.32 MIL/uL (ref 3.87–5.11)
RDW: 14.2 % (ref 11.5–15.5)
WBC: 6.8 10*3/uL (ref 4.0–10.5)
nRBC: 0.3 % — ABNORMAL HIGH (ref 0.0–0.2)

## 2023-01-04 LAB — PROTIME-INR
INR: 1.9 — ABNORMAL HIGH (ref 0.8–1.2)
Prothrombin Time: 22 seconds — ABNORMAL HIGH (ref 11.4–15.2)

## 2023-01-04 LAB — BRAIN NATRIURETIC PEPTIDE: B Natriuretic Peptide: 563.3 pg/mL — ABNORMAL HIGH (ref 0.0–100.0)

## 2023-01-04 LAB — PROCALCITONIN: Procalcitonin: <0.1 ng/mL

## 2023-01-04 LAB — DIGOXIN LEVEL: Digoxin Level: <0.2 ng/mL — ABNORMAL LOW (ref 0.8–2.0)

## 2023-01-04 LAB — TROPONIN I (HIGH SENSITIVITY)
Troponin I (High Sensitivity): 24 ng/L — ABNORMAL HIGH (ref <18)
Troponin I (High Sensitivity): 24 ng/L — ABNORMAL HIGH (ref <18)

## 2023-01-04 LAB — SARS CORONAVIRUS 2 BY RT PCR: SARS Coronavirus 2 by RT PCR: NEGATIVE

## 2023-01-04 MED ORDER — BUMETANIDE 2 MG PO TABS
2.0000 mg | ORAL_TABLET | Freq: Every day | ORAL | Status: DC
  Filled 2023-01-04: qty 1

## 2023-01-04 MED ORDER — VITAMIN B-12 1000 MCG PO TABS
1000.0000 ug | ORAL_TABLET | Freq: Every day | ORAL | Status: DC
  Administered 2023-01-06 – 2023-01-09 (×4): 1000 ug via ORAL
  Filled 2023-01-04 (×6): qty 1

## 2023-01-04 MED ORDER — SODIUM CHLORIDE 0.9 % IV SOLN
100.0000 mg | Freq: Once | INTRAVENOUS | Status: AC
  Administered 2023-01-04: 100 mg via INTRAVENOUS
  Filled 2023-01-04: qty 100

## 2023-01-04 MED ORDER — VITAMIN D 25 MCG (1000 UNIT) PO TABS
1000.0000 [IU] | ORAL_TABLET | Freq: Every day | ORAL | Status: DC
  Administered 2023-01-06 – 2023-01-09 (×4): 1000 [IU] via ORAL
  Filled 2023-01-04 (×5): qty 1

## 2023-01-04 MED ORDER — PANTOPRAZOLE SODIUM 40 MG PO TBEC
40.0000 mg | DELAYED_RELEASE_TABLET | Freq: Every day | ORAL | Status: DC
  Administered 2023-01-06 – 2023-01-09 (×4): 40 mg via ORAL
  Filled 2023-01-04 (×5): qty 1

## 2023-01-04 MED ORDER — DILTIAZEM HCL-DEXTROSE 125-5 MG/125ML-% IV SOLN (PREMIX)
5.0000 mg/h | INTRAVENOUS | Status: DC
  Administered 2023-01-04: 5 mg/h via INTRAVENOUS
  Administered 2023-01-04 – 2023-01-05 (×3): 15 mg/h via INTRAVENOUS
  Administered 2023-01-06 – 2023-01-07 (×4): 10 mg/h via INTRAVENOUS
  Filled 2023-01-04 (×8): qty 125

## 2023-01-04 MED ORDER — FUROSEMIDE 10 MG/ML IJ SOLN
60.0000 mg | Freq: Once | INTRAMUSCULAR | Status: AC
  Administered 2023-01-04: 60 mg via INTRAVENOUS
  Filled 2023-01-04: qty 6

## 2023-01-04 MED ORDER — METOPROLOL TARTRATE 25 MG PO TABS
25.0000 mg | ORAL_TABLET | Freq: Two times a day (BID) | ORAL | Status: DC
  Administered 2023-01-05 – 2023-01-08 (×7): 25 mg via ORAL
  Filled 2023-01-04 (×7): qty 1

## 2023-01-04 MED ORDER — ROSUVASTATIN CALCIUM 5 MG PO TABS
5.0000 mg | ORAL_TABLET | Freq: Every day | ORAL | Status: DC
  Administered 2023-01-05 – 2023-01-09 (×5): 5 mg via ORAL
  Filled 2023-01-04 (×5): qty 1

## 2023-01-04 MED ORDER — SODIUM CHLORIDE 0.9 % IV SOLN
2.0000 g | INTRAVENOUS | Status: AC
  Administered 2023-01-04 – 2023-01-08 (×5): 2 g via INTRAVENOUS
  Filled 2023-01-04 (×5): qty 20

## 2023-01-04 MED ORDER — TRAMADOL HCL 50 MG PO TABS
50.0000 mg | ORAL_TABLET | Freq: Two times a day (BID) | ORAL | Status: DC
  Administered 2023-01-06 – 2023-01-09 (×8): 50 mg via ORAL
  Filled 2023-01-04 (×10): qty 1

## 2023-01-04 MED ORDER — DILTIAZEM LOAD VIA INFUSION
10.0000 mg | Freq: Once | INTRAVENOUS | Status: AC
  Administered 2023-01-04: 10 mg via INTRAVENOUS
  Filled 2023-01-04: qty 10

## 2023-01-04 MED ORDER — CALCIUM CARBONATE-VITAMIN D 500-200 MG-UNIT PO TABS: 1.0000 | ORAL_TABLET | Freq: Two times a day (BID) | ORAL | Status: DC

## 2023-01-04 MED ORDER — ASPIRIN 81 MG PO CHEW
81.0000 mg | CHEWABLE_TABLET | Freq: Every day | ORAL | Status: DC
  Administered 2023-01-05 – 2023-01-09 (×5): 81 mg via ORAL
  Filled 2023-01-04 (×5): qty 1

## 2023-01-04 MED ORDER — METOPROLOL TARTRATE 25 MG PO TABS: 25.0000 mg | ORAL_TABLET | Freq: Two times a day (BID) | ORAL | Status: DC

## 2023-01-04 MED ORDER — LACTATED RINGERS IV SOLN: INTRAVENOUS | Status: DC

## 2023-01-04 MED ORDER — IPRATROPIUM-ALBUTEROL 0.5-2.5 (3) MG/3ML IN SOLN
3.0000 mL | RESPIRATORY_TRACT | Status: DC | PRN
  Administered 2023-01-07 – 2023-01-27 (×17): 3 mL via RESPIRATORY_TRACT
  Filled 2023-01-04 (×18): qty 3

## 2023-01-04 MED ORDER — CALCIUM CARBONATE 1250 (500 CA) MG PO TABS
1.0000 | ORAL_TABLET | Freq: Two times a day (BID) | ORAL | Status: DC
  Administered 2023-01-06 – 2023-01-09 (×6): 1250 mg via ORAL
  Filled 2023-01-04 (×7): qty 1

## 2023-01-04 MED ORDER — GABAPENTIN 300 MG PO CAPS
900.0000 mg | ORAL_CAPSULE | Freq: Every day | ORAL | Status: DC
  Administered 2023-01-05 – 2023-01-08 (×4): 900 mg via ORAL
  Filled 2023-01-04 (×4): qty 3

## 2023-01-04 MED ORDER — APIXABAN 5 MG PO TABS
5.0000 mg | ORAL_TABLET | Freq: Two times a day (BID) | ORAL | Status: DC
  Administered 2023-01-04 – 2023-01-08 (×8): 5 mg via ORAL
  Filled 2023-01-04 (×8): qty 1

## 2023-01-04 MED ORDER — SENNOSIDES-DOCUSATE SODIUM 8.6-50 MG PO TABS
1.0000 | ORAL_TABLET | Freq: Two times a day (BID) | ORAL | Status: DC
  Administered 2023-01-04 – 2023-01-09 (×11): 1 via ORAL
  Filled 2023-01-04 (×11): qty 1

## 2023-01-04 MED ORDER — ALLOPURINOL 100 MG PO TABS
100.0000 mg | ORAL_TABLET | Freq: Every day | ORAL | Status: DC
  Administered 2023-01-05 – 2023-01-09 (×5): 100 mg via ORAL
  Filled 2023-01-04 (×6): qty 1

## 2023-01-04 MED ORDER — DIGOXIN 125 MCG PO TABS
0.1250 mg | ORAL_TABLET | Freq: Every day | ORAL | Status: DC
  Administered 2023-01-05 – 2023-01-09 (×5): 0.125 mg via ORAL
  Filled 2023-01-04 (×6): qty 1

## 2023-01-04 MED ORDER — SODIUM CHLORIDE 0.9 % IV SOLN
100.0000 mg | Freq: Two times a day (BID) | INTRAVENOUS | Status: DC
  Administered 2023-01-05: 100 mg via INTRAVENOUS
  Filled 2023-01-04: qty 100

## 2023-01-04 MED ORDER — BUPROPION HCL ER (XL) 300 MG PO TB24
300.0000 mg | ORAL_TABLET | Freq: Every day | ORAL | Status: DC
  Administered 2023-01-05 – 2023-01-09 (×5): 300 mg via ORAL
  Filled 2023-01-04: qty 2
  Filled 2023-01-04: qty 1
  Filled 2023-01-04: qty 2
  Filled 2023-01-04: qty 1
  Filled 2023-01-04 (×3): qty 2

## 2023-01-04 NOTE — Progress Notes (Signed)
Patient with increased work of breathing, RT offered BIPAP but patient refused stating that she would like to eat. Patient placed on 8L [place],  patient states that she feels better. RN informed.

## 2023-01-04 NOTE — ED Triage Notes (Signed)
Coming from home called with weakness and slumped over on ems arrival family states x 1 this was happening with weakness alert and oriented still.  Wound noted to left leg by ems that is warm and family says pus coming out.  Baseline on 5L on the monitor with ems HR was 220 afib.  Ems fhad to place on NRB due O2 at 86 on [place] 96 on NRB

## 2023-01-04 NOTE — ED Provider Notes (Signed)
EMERGENCY DEPARTMENT AT [facility] Provider Note   CSN: [id number] Arrival date & time: 01/04/23  1243     History  Chief Complaint  Patient presents with   Irregular Heart Beat    [name] is a 82 y.o. female.  HPI    82 year old female with history of DVT/PE, paroxysmal A-fib, CHF, stroke comes in with chief complaint of acute shortness of breath.  I called patient's son and also patient's home phone number.  There was no response at either 1 of those numbers.  Voicemail was not left.  Patient indicates that she has been slowly getting worse over the last week.  Today her shortness of breath worsened.  Per EMS, patient had slumped forward when family found her.  She is usually on 5 L of oxygen, but had to be placed on nonrebreather.  Patient also has chronic lower extremity wounds and edema -patient states that a nurse comes to change it every 2 or 3 days.  Per EMS, family indicated that there was purulent drainage from the lower extremity.  Patient denies any fevers, chills, cough, URI-like symptoms, chest pain, back pain that is new, abdominal pain, UTI-like symptoms.  Home Medications Prior to Admission medications   Medication Sig Start Date End Date Taking? Authorizing Provider  allopurinol (ZYLOPRIM) 100 MG tablet Take 100 mg by mouth daily.   Yes [provider]  aspirin 81 MG tablet Take 81 mg by mouth daily.   Yes [provider]  bumetanide (BUMEX) 2 MG tablet Take 2 mg by mouth daily. 10/08/12  Yes [provider]  buPROPion (WELLBUTRIN XL) 300 MG 24 hr tablet Take 300 mg by mouth daily. 01/20/17  Yes [provider]  calcium-vitamin D (OSCAL WITH D) 500-200 MG-UNIT tablet Take 1 tablet by mouth 2 (two) times daily.   Yes [provider]  cyanocobalamin (VITAMIN B12) 1000 MCG tablet Take 1,000 mcg by mouth daily.   Yes [provider]  digoxin (LANOXIN) 0.125 MG tablet Take 0.125 mg by  mouth daily. 05/27/22  Yes [provider]  ELIQUIS 5 MG TABS tablet Take 5 mg by mouth 2 (two) times daily.   Yes [provider]  gabapentin (NEURONTIN) 300 MG capsule Take 900 mg by mouth daily. 07/02/12  Yes [provider]  methylPREDNISolone (MEDROL DOSEPAK) 4 MG TBPK tablet Take 4 mg by mouth as needed (Take as directed on package). 10/02/22  Yes [provider]  metoprolol tartrate (LOPRESSOR) 25 MG tablet Take 25 mg by mouth 2 (two) times a day. 02/23/10  Yes [provider]  pantoprazole (PROTONIX) 40 MG tablet Take 40 mg by mouth daily.   Yes [provider]  rosuvastatin (CRESTOR) 5 MG tablet Take 5 mg by mouth at bedtime. 07/02/22  Yes [provider]  traMADol (ULTRAM) 50 MG tablet Take 50 mg by mouth 2 (two) times daily. For moderate pain   Yes [provider]  triamcinolone ointment (KENALOG) 0.1 % Apply 1 Application topically as needed (As directed). 04/17/20  Yes [provider]      Allergies    Codeine and Morphine and codeine    Review of Systems   Review of Systems  All other systems reviewed and are negative.   Physical Exam Updated Vital Signs BP 117/61   Pulse (!) 102   Temp 98 F (36.7 C) (Oral)   Resp (!) 29   Ht 5\' 4"  (1.626 m)   Wt  90 kg   SpO2 97%   BMI 34.06 kg/m  Physical Exam Vitals and nursing note reviewed.  Constitutional:      Appearance: She is well-developed.  Cardiovascular:     Rate and Rhythm: Tachycardia present. Rhythm irregular.  Pulmonary:     Effort: Respiratory distress present.     Breath sounds: No wheezing or rales.     Comments: Anterior exam completed Musculoskeletal:     Cervical back: Normal range of motion and neck supple.     Comments: Patient has Ace wrap around both lower extremities along with Kerlix.  She has some erythema  Skin:    General: Skin is warm and dry.  Neurological:     Mental Status: She is alert and oriented to person,  place, and time.     ED Results / Procedures / Treatments   Labs (all labs ordered are listed, but only abnormal results are displayed) Labs Reviewed  COMPREHENSIVE METABOLIC PANEL - Abnormal; Notable for the following components:      Result Value   Chloride 93 (*)    CO2 33 (*)    Glucose, Bld 152 (*)    Albumin 3.1 (*)    AST 47 (*)    All other components within normal limits  CBC WITH DIFFERENTIAL/PLATELET - Abnormal; Notable for the following components:   MCV 101.4 (*)    MCHC 29.0 (*)    nRBC 0.3 (*)    All other components within normal limits  PROTIME-INR - Abnormal; Notable for the following components:   Prothrombin Time 22.0 (*)    INR 1.9 (*)    All other components within normal limits  URINALYSIS, W/ REFLEX TO CULTURE (INFECTION SUSPECTED) - Abnormal; Notable for the following components:   Color, Urine AMBER (*)    APPearance CLOUDY (*)    Protein, ur 100 (*)    Nitrite POSITIVE (*)    Leukocytes,Ua LARGE (*)    Bacteria, UA FEW (*)    All other components within normal limits  DIGOXIN LEVEL - Abnormal; Notable for the following components:   Digoxin Level <0.2 (*)    All other components within normal limits  BRAIN NATRIURETIC PEPTIDE - Abnormal; Notable for the following components:   B Natriuretic Peptide 563.3 (*)    All other components within normal limits  I-STAT CG4 LACTIC ACID, ED - Abnormal; Notable for the following components:   Lactic Acid, Venous 2.0 (*)    All other components within normal limits  I-STAT VENOUS BLOOD GAS, ED - Abnormal; Notable for the following components:   pCO2, Ven 66.7 (*)    pO2, Ven 191 (*)    Bicarbonate 40.7 (*)    TCO2 43 (*)    Acid-Base Excess 13.0 (*)    Potassium 5.9 (*)    All other components within normal limits  I-STAT ARTERIAL BLOOD GAS, ED - Abnormal; Notable for the following components:   pH, Arterial 7.310 (*)    pCO2 arterial 79.2 (*)    pO2, Arterial 113 (*)    Bicarbonate 39.8 (*)     TCO2 42 (*)    Acid-Base Excess 10.0 (*)    All other components within normal limits  I-STAT CG4 LACTIC ACID, ED - Abnormal; Notable for the following components:   Lactic Acid, Venous 3.1 (*)    All other components within normal limits  I-STAT VENOUS BLOOD GAS, ED - Abnormal; Notable for the following components:   pH, Ven 7.436 (*)  pO2, Ven 180 (*)    Bicarbonate 35.7 (*)    TCO2 37 (*)    Acid-Base Excess 10.0 (*)    Calcium, Ion 1.09 (*)    All other components within normal limits  I-STAT CG4 LACTIC ACID, ED - Abnormal; Notable for the following components:   Lactic Acid, Venous 2.1 (*)    All other components within normal limits  TROPONIN I (HIGH SENSITIVITY) - Abnormal; Notable for the following components:   Troponin I (High Sensitivity) 24 (*)    All other components within normal limits  TROPONIN I (HIGH SENSITIVITY) - Abnormal; Notable for the following components:   Troponin I (High Sensitivity) 24 (*)    All other components within normal limits  CULTURE, BLOOD (ROUTINE X 2)  CULTURE, BLOOD (ROUTINE X 2)  URINE CULTURE  SARS CORONAVIRUS 2 BY RT PCR  PROCALCITONIN  PROCALCITONIN  CBC  BASIC METABOLIC PANEL  I-STAT VENOUS BLOOD GAS, ED  I-STAT CG4 LACTIC ACID, ED    EKG EKG Interpretation Date/Time:  Saturday January 04 2023 12:51:52 EDT Ventricular Rate:  144 PR Interval:    QRS Duration:  84 QT Interval:  279 QTC Calculation: 432 R Axis:   89  Text Interpretation: Atrial fibrillation with rapid V-rate Borderline right axis deviation Low voltage, precordial leads Repolarization abnormality, prob rate related afib is new compared to ekg from 2009 no acute ischemic changes Confirmed by Derwood Kaplan ([id number]) on 01/04/2023 12:57:28 PM  Radiology DG Chest Port 1 View  Result Date: 01/04/2023 CLINICAL DATA:  Sepsis EXAM: PORTABLE CHEST 1 VIEW COMPARISON:  X-ray 09/20/22 FINDINGS: Developing moderate right effusion and adjacent lung opacity. Film is rotated  to the right. Enlarged cardiopericardial silhouette with a tortuous aorta. Interstitial prominence again seen. Apical pleural thickening. Subtle patchy opacity left lung base as well. No pneumothorax. Overlapping cardiac leads. Osteopenia. IMPRESSION: Developing moderate right effusion and adjacent lung opacities. Mild opacity left lung base. Recommend follow-up. Acute process is possible such as infiltrate Electronically Signed   By: Karen Kays M.D.   On: 01/04/2023 13:36    Procedures .Critical Care  Performed by: Derwood Kaplan, MD Authorized by: Derwood Kaplan, MD   Critical care provider statement:    Critical care time (minutes):  78   Critical care was necessary to treat or prevent imminent or life-threatening deterioration of the following conditions:  Circulatory failure and cardiac failure   Critical care was time spent personally by me on the following activities:  Development of treatment plan with patient or surrogate, discussions with consultants, evaluation of patient's response to treatment, examination of patient, ordering and review of laboratory studies, ordering and review of radiographic studies, ordering and performing treatments and interventions, pulse oximetry, re-evaluation of patient's condition, review of old charts, obtaining history from patient or surrogate and ventilator management     Medications Ordered in ED Medications  cefTRIAXone (ROCEPHIN) 2 g in sodium chloride 0.9 % 100 mL IVPB (0 g Intravenous Stopped 01/04/23 1559)  diltiazem (CARDIZEM) 1 mg/mL load via infusion 10 mg (10 mg Intravenous Bolus from Bag 01/04/23 1522)    And  diltiazem (CARDIZEM) 125 mg in dextrose 5% 125 mL (1 mg/mL) infusion (15 mg/hr Intravenous Rate/Dose Change 01/04/23 1759)  furosemide (LASIX) injection 60 mg (has no administration in time range)  doxycycline (VIBRAMYCIN) 100 mg in sodium chloride 0.9 % 250 mL IVPB (has no administration in time range)  allopurinol (ZYLOPRIM)  tablet 100 mg (has no administration in time range)  aspirin  tablet 81 mg (has no administration in time range)  traMADol (ULTRAM) tablet 50 mg (has no administration in time range)  bumetanide (BUMEX) tablet 2 mg (has no administration in time range)  digoxin (LANOXIN) tablet 0.125 mg (has no administration in time range)  buPROPion (WELLBUTRIN XL) 24 hr tablet 300 mg (has no administration in time range)  pantoprazole (PROTONIX) EC tablet 40 mg (has no administration in time range)  apixaban (ELIQUIS) tablet 5 mg (has no administration in time range)  cyanocobalamin (VITAMIN B12) tablet 1,000 mcg (has no administration in time range)  gabapentin (NEURONTIN) capsule 900 mg (has no administration in time range)  calcium-vitamin D (OSCAL WITH D) 500-200 MG-UNIT per tablet 1 tablet (has no administration in time range)  rosuvastatin (CRESTOR) tablet 5 mg (has no administration in time range)  metoprolol tartrate (LOPRESSOR) tablet 25 mg (has no administration in time range)  doxycycline (VIBRAMYCIN) 100 mg in sodium chloride 0.9 % 250 mL IVPB (0 mg Intravenous Stopped 01/04/23 1942)    ED Course/ Medical Decision Making/ A&P Clinical Course as of 01/04/23 2018  Sat Jan 04, 2023  1500 DG Chest Port 1 View Chest x-ray interpreted just now.  There is concerns for questionable pneumonia.  I see evidence of pleural effusion. At this time, suspicion is high that most likely patient is not having infection, but given her comorbidities, increased oxygen requirement will be prudent to cover her for pneumonia until the clinical picture gets clear.  Antibiotics ordered.  She also will receive diltiazem bolus and drip. [AN]  1501 I-Stat arterial blood gas, ED(!!) ABG reveals hypercapnic respiratory failure.  Repeat VBG ordered. [AN]    Clinical Course User Index [AN] Derwood Kaplan, MD                             Medical Decision Making Amount and/or Complexity of Data Reviewed Labs: ordered.  Decision-making details documented in ED Course. Radiology: ordered. Decision-making details documented in ED Course.  Risk Prescription drug management. Decision regarding hospitalization.   This patient presents to the ED with chief complaint(s) of acute shortness of breath with pertinent past medical history of A-fib, PE, DVT on Eliquis, CHF. The complaint involves an extensive differential diagnosis and also carries with it a high risk of complications and morbidity.    Patient noted to be in A-fib with RVR.  She is tachypneic.  The differential diagnosis includes: A-fib with RVR leading to pulmonary edema and dyspnea, worsening CHF leading to A-fib, underlying infection leading to A-fib, acute coronary syndrome, pleural effusion, pneumonia, pulmonary embolism.  Patient indicates that she did not take her blood thinner yesterday.  Therefore she is not a candidate for elective cardioversion.  Additionally, it is possible that the primary cause for A-fib with RVR is infection therefore we will not recommend cardioversion at this time.  The initial plan is to get basic labs for A-fib.  Nursing staff had already sent sepsis type workup. We will start patient on BiPAP.  She is breathing over 35 times a minute.  Additional history obtained: Additional history obtained from EMS  Records reviewed Care Everywhere/External Records.  Patient sees [facility].  She has known paroxysmal A-fib.  It appears that she is on Eliquis.  Also patient has CHF, she appears to be on digoxin.  Independent labs interpretation:  The following labs were independently interpreted: Hypercapnic respiratory failure on VBG.  Patient's white count is reassuring.  Initial  troponin is 24.  Independent visualization and interpretation of imaging: - I independently visualized the following imaging with scope of interpretation limited to determining acute life threatening conditions related to emergency care: X-ray of  the chest, which revealed right-sided pleural effusion  Treatment and Reassessment: At 335 patient reassessed. She is more somnolent than she was when she first arrived. According to the RT, patient was quite tired and had very poor tidal volume when they first placed the BiPAP.  Patient also fell asleep immediately upon BiPAP placement.  Repeat venous blood gas has been ordered.  She might need intubation, if she is trending in the wrong direction.  The initial blood gas was done when patient was on nonrebreather.  Current BiPAP settings were 20/10 IPAP-EPAP.  We have changed the settings to 26/8 to improve the tidal volume, ABG did not reveal any hypoxia.  Reassessment: On repeat assessment, patient's blood gas has shown improvement.  Patient on reassessment is now arousable. Her lactic acid is also improved.  Heart rate has come down.  At this time she is stable for admission to medicine team, likely with stepdown needs.   Final Clinical Impression(s) / ED Diagnoses Final diagnoses:  Acute respiratory failure with hypoxia and hypercapnia (HCC)  Atrial fibrillation with rapid ventricular response [facility])    Rx / DC Orders ED Discharge Orders     None         Derwood Kaplan, MD 01/04/23 2018

## 2023-01-04 NOTE — ED Notes (Signed)
RT notified and pt is off Bipap on 4L at this time pt is significantly more alert and awake then on arrival

## 2023-01-04 NOTE — H&P (Signed)
History and Physical    Patient: [name] HQI:[id number] DOB: Jun 05, 1941 DOA: 01/04/2023 DOS: the patient was seen and examined on 01/04/2023 PCP: Herma Carson, MD  Patient coming from: Home  Chief Complaint:  Chief Complaint  Patient presents with   Irregular Heart Beat   HPI: [name] is a 82 y.o. female with medical history significant of Takotsubo cardiomyopathy, CVA, DVT/PE, ventricular fibrillation, chronic hypoxemic respiratory failure on 2L who presents with weakness, labored respiration and increased altered mental status.  Patient normally on 2L O2 PRN but for the past month and half has been using it daily. Has dyspnea at rest but worse with exertion. Also had bilateral LE edema for the past month. She was advised to increase her bumex to twice a day for the past week although daughter unsure if it made any significant change to her edema. She also had a small left LE wound that was treated with antibiotics are 3 weeks ago. About a week ago she started to have increase weakness, slurred speech. Today family found her with LE weakness, some upper extremity twitching. Also had labored respiration and new cough today. No fever. No recent sick contact. No nausea, vomiting. Has constipation.   On EMS arrival in the field, she was noted to be hypoxic to 70% and placed on nonrebreather.  Had ABG with hypercapnic respiratory failure with CO2 of 70, bicarb of 39 and was placed on BiPAP.  No leukocytosis or anemia.  No significant electrolyte abnormality.  Lactate was elevated 2.  BNP of 563.  Troponin was flat at 24.  Chest x-ray on my review showed right moderate pleural effusion with opacity, left-sided opacity.  In the ED, she improved on continuous BiPAP with repeat VBG with CO2 down to 53.  She was able to be taken off BiPAP and is currently placed on 4 L via nasal cannula.  Review of Systems: As mentioned in the history of present illness. All other systems reviewed and  are negative. Past Medical History:  Diagnosis Date   Cardiac murmur    Echo, EF-55-60, mild tricuspid regurgitation   CVA (cerebral vascular accident) (HCC) 1991   DVT (deep venous thrombosis) (HCC)    PE (pulmonary embolism)    Takotsubo syndrome 08/2009   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   EF - 55-60, Tricuspid valve - mild-moderate regurgitation; 12/14/2009 - EF-55, mild to moderate tricuspid regurgitation   Past Surgical History:  Procedure Laterality Date   BIOPSY  11/02/2018   Procedure: BIOPSY;  Surgeon: Jeani Hawking, MD;  Location: [facility] ENDOSCOPY;  Service: Endoscopy;;   CARDIAC CATHETERIZATION  09/08/2009   EF-45, Free of disease   ESOPHAGOGASTRODUODENOSCOPY N/A 11/02/2018   Procedure: ESOPHAGOGASTRODUODENOSCOPY (EGD);  Surgeon: Jeani Hawking, MD;  Location: [facility] ENDOSCOPY;  Service: Endoscopy;  Laterality: N/A;   FOREIGN BODY REMOVAL  11/02/2018   Procedure: FOREIGN BODY REMOVAL;  Surgeon: Jeani Hawking, MD;  Location: [facility] ENDOSCOPY;  Service: Endoscopy;;   Social History:  reports that she quit smoking about 16 years ago. Her smoking use included cigarettes. She started smoking about 67 years ago. She has a 76.5 pack-year smoking history. She has never used smokeless tobacco. She reports that she does not drink alcohol and does not use drugs.  Allergies  Allergen Reactions   Codeine    Morphine And Codeine Other (See Comments)    confusion    Family History  Problem Relation Age of Onset   Heart disease Mother    Heart disease Father  Heart disease Brother    Heart disease Sister    Heart Problems Brother    Diabetes Brother     Prior to Admission medications   Medication Sig Start Date End Date Taking? Authorizing Provider  allopurinol (ZYLOPRIM) 100 MG tablet Take 100 mg by mouth daily.   Yes [provider]  aspirin 81 MG tablet Take 81 mg by mouth daily.   Yes [provider]  bumetanide (BUMEX) 2 MG tablet Take 2 mg by mouth daily. 10/08/12   Yes [provider]  buPROPion (WELLBUTRIN XL) 300 MG 24 hr tablet Take 300 mg by mouth daily. 01/20/17  Yes [provider]  calcium-vitamin D (OSCAL WITH D) 500-200 MG-UNIT tablet Take 1 tablet by mouth 2 (two) times daily.   Yes [provider]  cyanocobalamin (VITAMIN B12) 1000 MCG tablet Take 1,000 mcg by mouth daily.   Yes [provider]  digoxin (LANOXIN) 0.125 MG tablet Take 0.125 mg by mouth daily. 05/27/22  Yes [provider]  ELIQUIS 5 MG TABS tablet Take 5 mg by mouth 2 (two) times daily.   Yes [provider]  gabapentin (NEURONTIN) 300 MG capsule Take 900 mg by mouth daily. 07/02/12  Yes [provider]  methylPREDNISolone (MEDROL DOSEPAK) 4 MG TBPK tablet Take 4 mg by mouth as needed (Take as directed on package). 10/02/22  Yes [provider]  metoprolol tartrate (LOPRESSOR) 25 MG tablet Take 25 mg by mouth 2 (two) times a day. 02/23/10  Yes [provider]  pantoprazole (PROTONIX) 40 MG tablet Take 40 mg by mouth daily.   Yes [provider]  rosuvastatin (CRESTOR) 5 MG tablet Take 5 mg by mouth at bedtime. 07/02/22  Yes [provider]  traMADol (ULTRAM) 50 MG tablet Take 50 mg by mouth 2 (two) times daily. For moderate pain   Yes [provider]  triamcinolone ointment (KENALOG) 0.1 % Apply 1 Application topically as needed (As directed). 04/17/20  Yes [provider]    Physical Exam: Vitals:   01/04/23 1745 01/04/23 1747 01/04/23 1830 01/04/23 1835  BP: 117/61     Pulse: 78  88 (!) 102  Resp: (!) 32  (!) 36 (!) 29  Temp:  98 F (36.7 C)    TempSrc:  Oral    SpO2: 93%  97% 97%  Weight:      Height:       Constitutional: NAD, calm, comfortable, ill but non-toxic appearing Eyes: lids and conjunctivae normal ENMT: Mucous membranes are moist.  Neck: normal, supple Respiratory: diminished breath sounds throughout but no wheezing, no crackles. Able to speak  in full sentences but with labored respiratory effort. No accessory muscle use. On 4L via [place] Cardiovascular: Irregularly irregular rate and rhythm, no murmurs / rubs / gallops. +1 bilateral LE edema up to mid-pre tibial region Abdomen: no tenderness, Bowel sounds positive.  Musculoskeletal: no clubbing / cyanosis. No joint deformity upper and lower extremities.  Normal muscle tone.  Skin: small non-healing ulcerated SQ wound with purulent drainage to left distal LE Neurologic: CN 2-12 grossly intact.  Strength 5/5 in all 4.  Psychiatric: Normal judgment and insight. Alert and oriented x 3. Normal mood.   Data Reviewed:  See HPI  Assessment and Plan: * Acute on chronic respiratory failure with hypoxia and hypercapnia (HCC) Acute CHF Community acquired pneumonia -previously on 2L O2 PRN but has been using it daily in the past month and half with increasing bilateral LE edema.  -  CXR today with moderate right pleural effusion with bilateral opacity. Suspect acute congestive heart failure and possible new pneumonia -continue IV Rocephin and Doxycycline. Will also check COVID PCR. Procalcitonin is pending.  -required Bipap on presentation due to hypoxia to O2 70% and CO2 in the 70s, but now able to titrate down to 4L [place] -continue to maintain 02 >92%   Acute congestive heart failure (HCC) -hx of remote cardiomyopathy  -last echocardiogram in 2015 with EF 55%-60% -has been noting increase dyspnea at rest and worse with exertion for the past month with bilateral LE edema although has chronic venous stasis. CXR also shows new moderate right sided effusion -will obtain new echo -on home bumex 2g daily  -will give one time dose of IV lasix overnight and follow intake, output and daily weights. Follow echo results.   Cellulitis -left non-healing LE wound with purulent drainage and mild cellulitis. She was treated with a course of doxycycline about 3 weeks ago -continue IV antibiotics -wound care    Hypotension -had intermittent hypotension on arrival. Now stable on diltiazem infusion.   Paroxysmal atrial fibrillation with RVR (HCC) -presented with HR in the 150. Likely exacerbated with suspected CHF and pneumonia -continue IV diltiazem infusion with goal >110 -continue Eliquis  -continue home digoxin      Advance Care Planning:   Code Status: Full Code   Consults: none  Family Communication: daughter at bedside  Severity of Illness: The appropriate patient status for this patient is INPATIENT. Inpatient status is judged to be reasonable and necessary in order to provide the required intensity of service to ensure the patient's safety. The patient's presenting symptoms, physical exam findings, and initial radiographic and laboratory data in the context of their chronic comorbidities is felt to place them at high risk for further clinical deterioration. Furthermore, it is not anticipated that the patient will be medically stable for discharge from the hospital within 2 midnights of admission.   * I certify that at the point of admission it is my clinical judgment that the patient will require inpatient hospital care spanning beyond 2 midnights from the point of admission due to high intensity of service, high risk for further deterioration and high frequency of surveillance required.*  Author: Anselm Jungling, DO 01/04/2023 8:11 PM  For on call review [email or web address].

## 2023-01-04 NOTE — Assessment & Plan Note (Addendum)
-  hx of remote cardiomyopathy  -last echocardiogram in 2015 with EF 55%-60% -has been noting increase dyspnea at rest and worse with exertion for the past month with bilateral LE edema although has chronic venous stasis. CXR also shows new moderate right sided effusion -will obtain new echo -on home bumex 2g daily  -will give one time dose of IV lasix overnight and follow intake, output and daily weights. Follow echo results.

## 2023-01-04 NOTE — ED Notes (Signed)
ED TO INPATIENT HANDOFF REPORT  ED Nurse Name and Phone #:  Gillis Ends [phone]  S Name/Age/Gender [name] 82 y.o. female Room/Bed: 006C/006C  Code Status   Code Status: Full Code  Home/SNF/Other Home Patient oriented to: self, place, time, and situation Is this baseline? Yes   Triage Complete: Triage complete  Chief Complaint Acute on chronic respiratory failure with hypoxia and hypercapnia (HCC) [J19.14, J96.22]  Triage Note Coming from home called with weakness and slumped over on ems arrival family states x 1 this was happening with weakness alert and oriented still.  Wound noted to left leg by ems that is warm and family says pus coming out.  Baseline on 5L on the monitor with ems HR was 220 afib.  Ems fhad to place on NRB due O2 at 86 on [place] 96 on NRB   Family is starting to fade between conversations and slur her speech x 1 week    Allergies Allergies  Allergen Reactions   Codeine    Morphine And Codeine Other (See Comments)    confusion    Level of Care/Admitting Diagnosis ED Disposition     ED Disposition  Admit   Condition  --   Comment  Hospital Area: [facility] [[id number]]  Level of Care: Progressive [102]  Admit to Progressive based on following criteria: CARDIOVASCULAR & THORACIC of moderate stability with acute coronary syndrome symptoms/low risk myocardial infarction/hypertensive urgency/arrhythmias/heart failure potentially compromising stability and stable post cardiovascular intervention patients.  May admit patient to [facility] or [facility] if equivalent level of care is available:: No  Covid Evaluation: Asymptomatic - no recent exposure (last 10 days) testing not required  Diagnosis: Acute on chronic respiratory failure with hypoxia and hypercapnia [facility]) [[id number]]  Admitting Physician: Anselm Jungling [[id number]]  Attending Physician: Anselm Jungling [[id number]]  Certification:: I certify this patient will need inpatient services for at  least 2 midnights  Estimated Length of Stay: 4          B Medical/Surgery History Past Medical History:  Diagnosis Date   Cardiac murmur    Echo, EF-55-60, mild tricuspid regurgitation   CVA (cerebral vascular accident) (HCC) 1991   DVT (deep venous thrombosis) (HCC)    PE (pulmonary embolism)    Takotsubo syndrome 08/2009   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   EF - 55-60, Tricuspid valve - mild-moderate regurgitation; 12/14/2009 - EF-55, mild to moderate tricuspid regurgitation   Past Surgical History:  Procedure Laterality Date   BIOPSY  11/02/2018   Procedure: BIOPSY;  Surgeon: Jeani Hawking, MD;  Location: [facility] ENDOSCOPY;  Service: Endoscopy;;   CARDIAC CATHETERIZATION  09/08/2009   EF-45, Free of disease   ESOPHAGOGASTRODUODENOSCOPY N/A 11/02/2018   Procedure: ESOPHAGOGASTRODUODENOSCOPY (EGD);  Surgeon: Jeani Hawking, MD;  Location: [facility] ENDOSCOPY;  Service: Endoscopy;  Laterality: N/A;   FOREIGN BODY REMOVAL  11/02/2018   Procedure: FOREIGN BODY REMOVAL;  Surgeon: Jeani Hawking, MD;  Location: [facility] ENDOSCOPY;  Service: Endoscopy;;     A IV Location/Drains/Wounds Patient Lines/Drains/Airways Status     Active Line/Drains/Airways     Name Placement date Placement time Site Days   Peripheral IV 01/04/23 20 G Right Antecubital 01/04/23  1300  Antecubital  less than 1   Peripheral IV 01/04/23 22 G Right Hand 01/04/23  1400  Hand  less than 1   External Urinary Catheter 01/04/23  1759  --  less than 1            Intake/Output  Last 24 hours  Intake/Output Summary (Last 24 hours) at 01/04/2023 2041 Last data filed at 01/04/2023 6010 Gross per 24 hour  Intake 675 ml  Output --  Net 675 ml    Labs/Imaging Results for orders placed or performed during the hospital encounter of 01/04/23 (from the past 48 hour(s))  Comprehensive metabolic panel     Status: Abnormal   Collection Time: 01/04/23 12:50 PM  Result Value Ref Range   Sodium 141 135 - 145 mmol/L   Potassium  5.1 3.5 - 5.1 mmol/L    Comment: HEMOLYSIS AT THIS LEVEL MAY AFFECT RESULT   Chloride 93 (L) 98 - 111 mmol/L   CO2 33 (H) 22 - 32 mmol/L   Glucose, Bld 152 (H) 70 - 99 mg/dL    Comment: Glucose reference range applies only to samples taken after fasting for at least 8 hours.   BUN 23 8 - 23 mg/dL   Creatinine, Ser 9.32 0.44 - 1.00 mg/dL   Calcium 35.5 8.9 - 73.2 mg/dL   Total Protein 6.6 6.5 - 8.1 g/dL   Albumin 3.1 (L) 3.5 - 5.0 g/dL   AST 47 (H) 15 - 41 U/L    Comment: HEMOLYSIS AT THIS LEVEL MAY AFFECT RESULT   ALT 29 0 - 44 U/L    Comment: HEMOLYSIS AT THIS LEVEL MAY AFFECT RESULT   Alkaline Phosphatase 126 38 - 126 U/L   Total Bilirubin 1.2 0.3 - 1.2 mg/dL    Comment: HEMOLYSIS AT THIS LEVEL MAY AFFECT RESULT   GFR, Estimated >60 >60 mL/min    Comment: (NOTE) Calculated using the CKD-EPI Creatinine Equation (2021)    Anion gap 15 5 - 15    Comment: Performed at [facility], [address], [place], [place] [address]  CBC with Differential     Status: Abnormal   Collection Time: 01/04/23 12:50 PM  Result Value Ref Range   WBC 6.8 4.0 - 10.5 K/uL   RBC 4.32 3.87 - 5.11 MIL/uL   Hemoglobin 12.7 12.0 - 15.0 g/dL   HCT 27.0 62.3 - 76.2 %   MCV 101.4 (H) 80.0 - 100.0 fL   MCH 29.4 26.0 - 34.0 pg   MCHC 29.0 (L) 30.0 - 36.0 g/dL   RDW 83.1 51.7 - 61.6 %   Platelets 286 150 - 400 K/uL   nRBC 0.3 (H) 0.0 - 0.2 %   Neutrophils Relative % 71 %   Neutro Abs 4.8 1.7 - 7.7 K/uL   Lymphocytes Relative 15 %   Lymphs Abs 1.0 0.7 - 4.0 K/uL   Monocytes Relative 13 %   Monocytes Absolute 0.9 0.1 - 1.0 K/uL   Eosinophils Relative 0 %   Eosinophils Absolute 0.0 0.0 - 0.5 K/uL   Basophils Relative 0 %   Basophils Absolute 0.0 0.0 - 0.1 K/uL   Immature Granulocytes 1 %   Abs Immature Granulocytes 0.07 0.00 - 0.07 K/uL    Comment: Performed at [facility], [address], [place], [place] [address]  Protime-INR     Status: Abnormal   Collection Time: 01/04/23 12:50 PM   Result Value Ref Range   Prothrombin Time 22.0 (H) 11.4 - 15.2 seconds   INR 1.9 (H) 0.8 - 1.2    Comment: (NOTE) INR goal varies based on device and disease states. Performed at [facility], [address], [place], [place] [address]   Urinalysis, w/ Reflex to Culture (Infection Suspected) -Urine, Clean Catch  Status: Abnormal   Collection Time: 01/04/23 12:50 PM  Result Value Ref Range   Specimen Source URINE, CATHETERIZED    Color, Urine AMBER (A) YELLOW    Comment: BIOCHEMICALS MAY BE AFFECTED BY COLOR   APPearance CLOUDY (A) CLEAR   Specific Gravity, Urine 1.023 1.005 - 1.030   pH 7.0 5.0 - 8.0   Glucose, UA NEGATIVE NEGATIVE mg/dL   Hgb urine dipstick NEGATIVE NEGATIVE   Bilirubin Urine NEGATIVE NEGATIVE   Ketones, ur NEGATIVE NEGATIVE mg/dL   Protein, ur 119 (A) NEGATIVE mg/dL   Nitrite POSITIVE (A) NEGATIVE   Leukocytes,Ua LARGE (A) NEGATIVE   RBC / HPF 0-5 0 - 5 RBC/hpf   WBC, UA 21-50 0 - 5 WBC/hpf    Comment:        Reflex urine culture not performed if WBC <=10, OR if Squamous epithelial cells >5. If Squamous epithelial cells >5 suggest recollection.    Bacteria, UA FEW (A) NONE SEEN   Squamous Epithelial / HPF 0-5 0 - 5 /HPF   Amorphous Crystal PRESENT    Triple Phosphate Crystal PRESENT     Comment: Performed at [facility], [address], [place], [place] [address]  Digoxin level     Status: Abnormal   Collection Time: 01/04/23 12:50 PM  Result Value Ref Range   Digoxin Level <0.2 (L) 0.8 - 2.0 ng/mL    Comment: RESULT CONFIRMED BY MANUAL DILUTION Performed at [facility], [address], [place], [place] [address]   Brain natriuretic peptide     Status: Abnormal   Collection Time: 01/04/23 12:50 PM  Result Value Ref Range   B Natriuretic Peptide 563.3 (H) 0.0 - 100.0 pg/mL    Comment: Performed at [facility], [address] [place], [place] [address]  Troponin I (High Sensitivity)     Status: Abnormal   Collection  Time: 01/04/23 12:50 PM  Result Value Ref Range   Troponin I (High Sensitivity) 24 (H) <18 ng/L    Comment: (NOTE) Elevated high sensitivity troponin I (hsTnI) values and significant  changes across serial measurements may suggest ACS but many other  chronic and acute conditions are known to elevate hsTnI results.  Refer to the "Links" section for chest pain algorithms and additional  guidance. Performed at [facility], [address] [place], [place] [address]   I-Stat Lactic Acid, ED     Status: Abnormal   Collection Time: 01/04/23  1:14 PM  Result Value Ref Range   Lactic Acid, Venous 2.0 (HH) 0.5 - 1.9 mmol/L   Comment NOTIFIED PHYSICIAN   I-Stat venous blood gas, ([facility], [facility], [facility])     Status: Abnormal   Collection Time: 01/04/23  1:24 PM  Result Value Ref Range   pH, Ven 7.394 7.25 - 7.43   pCO2, Ven 66.7 (H) 44 - 60 mmHg   pO2, Ven 191 (H) 32 - 45 mmHg   Bicarbonate 40.7 (H) 20.0 - 28.0 mmol/L   TCO2 43 (H) 22 - 32 mmol/L   O2 Saturation 100 %   Acid-Base Excess 13.0 (H) 0.0 - 2.0 mmol/L   Sodium 137 135 - 145 mmol/L   Potassium 5.9 (H) 3.5 - 5.1 mmol/L   Calcium, Ion 1.20 1.15 - 1.40 mmol/L   HCT 41.0 36.0 - 46.0 %   Hemoglobin 13.9 12.0 - 15.0 g/dL   Sample type VENOUS    Comment NOTIFIED PHYSICIAN   I-Stat arterial blood gas,  ED     Status: Abnormal   Collection Time: 01/04/23  1:41 PM  Result Value Ref Range   pH, Arterial 7.310 (L) 7.35 - 7.45   pCO2 arterial 79.2 (HH) 32 - 48 mmHg   pO2, Arterial 113 (H) 83 - 108 mmHg   Bicarbonate 39.8 (H) 20.0 - 28.0 mmol/L   TCO2 42 (H) 22 - 32 mmol/L   O2 Saturation 98 %   Acid-Base Excess 10.0 (H) 0.0 - 2.0 mmol/L   Sodium 140 135 - 145 mmol/L   Potassium 4.0 3.5 - 5.1 mmol/L   Calcium, Ion 1.38 1.15 - 1.40 mmol/L   HCT 41.0 36.0 - 46.0 %   Hemoglobin 13.9 12.0 - 15.0 g/dL   Sample type ARTERIAL    Comment NOTIFIED PHYSICIAN   I-Stat Lactic Acid, ED     Status: Abnormal   Collection Time: 01/04/23  2:25 PM   Result Value Ref Range   Lactic Acid, Venous 3.1 (HH) 0.5 - 1.9 mmol/L   Comment NOTIFIED PHYSICIAN   I-Stat CG4 Lactic Acid     Status: Abnormal   Collection Time: 01/04/23  3:56 PM  Result Value Ref Range   Lactic Acid, Venous 2.1 (HH) 0.5 - 1.9 mmol/L   Comment NOTIFIED PHYSICIAN   I-Stat venous blood gas, ([facility], [facility], [facility])     Status: Abnormal   Collection Time: 01/04/23  4:27 PM  Result Value Ref Range   pH, Ven 7.436 (H) 7.25 - 7.43   pCO2, Ven 53.1 44 - 60 mmHg   pO2, Ven 180 (H) 32 - 45 mmHg   Bicarbonate 35.7 (H) 20.0 - 28.0 mmol/L   TCO2 37 (H) 22 - 32 mmol/L   O2 Saturation 100 %   Acid-Base Excess 10.0 (H) 0.0 - 2.0 mmol/L   Sodium 140 135 - 145 mmol/L   Potassium 4.0 3.5 - 5.1 mmol/L   Calcium, Ion 1.09 (L) 1.15 - 1.40 mmol/L   HCT 40.0 36.0 - 46.0 %   Hemoglobin 13.6 12.0 - 15.0 g/dL   Sample type VENOUS   Troponin I (High Sensitivity)     Status: Abnormal   Collection Time: 01/04/23  5:17 PM  Result Value Ref Range   Troponin I (High Sensitivity) 24 (H) <18 ng/L    Comment: (NOTE) Elevated high sensitivity troponin I (hsTnI) values and significant  changes across serial measurements may suggest ACS but many other  chronic and acute conditions are known to elevate hsTnI results.  Refer to the "Links" section for chest pain algorithms and additional  guidance. Performed at [facility], [address] [place], [place] [address]   Procalcitonin     Status: None   Collection Time: 01/04/23  5:17 PM  Result Value Ref Range   Procalcitonin <0.10 ng/mL    Comment:        Interpretation: PCT (Procalcitonin) <= 0.5 ng/mL: Systemic infection (sepsis) is not likely. Local bacterial infection is possible. (NOTE)       Sepsis PCT Algorithm           Lower Respiratory Tract                                      Infection PCT Algorithm    ----------------------------     ----------------------------         PCT < 0.25 ng/mL  PCT < 0.10 ng/mL           Strongly encourage             Strongly discourage   discontinuation of antibiotics    initiation of antibiotics    ----------------------------     -----------------------------       PCT 0.25 - 0.50 ng/mL            PCT 0.10 - 0.25 ng/mL               OR       >80% decrease in PCT            Discourage initiation of                                            antibiotics      Encourage discontinuation           of antibiotics    ----------------------------     -----------------------------         PCT >= 0.50 ng/mL              PCT 0.26 - 0.50 ng/mL               AND        <80% decrease in PCT             Encourage initiation of                                             antibiotics       Encourage continuation           of antibiotics    ----------------------------     -----------------------------        PCT >= 0.50 ng/mL                  PCT > 0.50 ng/mL               AND         increase in PCT                  Strongly encourage                                      initiation of antibiotics    Strongly encourage escalation           of antibiotics                                     -----------------------------                                           PCT <= 0.25 ng/mL                                                 OR                                        >  80% decrease in PCT                                      Discontinue / Do not initiate                                             antibiotics  Performed at [facility], [address], [place], [place] [address]    DG Chest Port 1 View  Result Date: 01/04/2023 CLINICAL DATA:  Sepsis EXAM: PORTABLE CHEST 1 VIEW COMPARISON:  X-ray 09/20/22 FINDINGS: Developing moderate right effusion and adjacent lung opacity. Film is rotated to the right. Enlarged cardiopericardial silhouette with a tortuous aorta. Interstitial prominence again seen. Apical pleural thickening. Subtle patchy opacity left lung base as  well. No pneumothorax. Overlapping cardiac leads. Osteopenia. IMPRESSION: Developing moderate right effusion and adjacent lung opacities. Mild opacity left lung base. Recommend follow-up. Acute process is possible such as infiltrate Electronically Signed   By: Karen Kays M.D.   On: 01/04/2023 13:36    Pending Labs Unresulted Labs (From admission, onward)     Start     Ordered   01/05/23 0500  CBC  Tomorrow morning,   R        01/04/23 1938   01/05/23 0500  Basic metabolic panel  Tomorrow morning,   R        01/04/23 1938   01/04/23 1912  SARS Coronavirus 2 by RT PCR (hospital order, performed in [facility] hospital lab) *cepheid single result test* Anterior Nasal Swab  (Tier 2 - SARS Coronavirus 2 by RT PCR (hospital order, performed in [facility] hospital lab) *cepheid single result test*)  Once,   R        01/04/23 1912   01/04/23 1558  Procalcitonin  Daily,   R     References:    Procalcitonin Lower Respiratory Tract Infection AND Sepsis Procalcitonin Algorithm   01/04/23 1557   01/04/23 1250  Culture, blood (Routine x 2)  BLOOD CULTURE X 2,   R      01/04/23 1250   01/04/23 1250  Urine Culture  Once,   R        01/04/23 1250            Vitals/Pain Today's Vitals   01/04/23 1745 01/04/23 1747 01/04/23 1830 01/04/23 1835  BP: 117/61     Pulse: 78  88 (!) 102  Resp: (!) 32  (!) 36 (!) 29  Temp:  98 F (36.7 C)    TempSrc:  Oral    SpO2: 93%  97% 97%  Weight:      Height:        Isolation Precautions Airborne and Contact precautions  Medications Medications  cefTRIAXone (ROCEPHIN) 2 g in sodium chloride 0.9 % 100 mL IVPB (0 g Intravenous Stopped 01/04/23 1559)  diltiazem (CARDIZEM) 1 mg/mL load via infusion 10 mg (10 mg Intravenous Bolus from Bag 01/04/23 1522)    And  diltiazem (CARDIZEM) 125 mg in dextrose 5% 125 mL (1 mg/mL) infusion (15 mg/hr Intravenous Rate/Dose Change 01/04/23 1759)  furosemide (LASIX) injection 60 mg (has no administration in time range)   doxycycline (VIBRAMYCIN) 100 mg in sodium chloride 0.9 % 250 mL IVPB (has no administration in time range)  allopurinol (ZYLOPRIM) tablet 100 mg (has no administration in time range)  aspirin chewable tablet 81 mg (has no administration in time range)  traMADol (ULTRAM) tablet 50 mg (has no administration in time range)  bumetanide (BUMEX) tablet 2 mg (has no administration in time range)  digoxin (LANOXIN) tablet 0.125 mg (has no administration in time range)  buPROPion (WELLBUTRIN XL) 24 hr tablet 300 mg (has no administration in time range)  pantoprazole (PROTONIX) EC tablet 40 mg (has no administration in time range)  apixaban (ELIQUIS) tablet 5 mg (has no administration in time range)  cyanocobalamin (VITAMIN B12) tablet 1,000 mcg (has no administration in time range)  gabapentin (NEURONTIN) capsule 900 mg (has no administration in time range)  rosuvastatin (CRESTOR) tablet 5 mg (has no administration in time range)  metoprolol tartrate (LOPRESSOR) tablet 25 mg (has no administration in time range)  calcium carbonate (OS-CAL - dosed in mg of elemental calcium) tablet 1,250 mg (has no administration in time range)  cholecalciferol (VITAMIN D3) 25 MCG (1000 UNIT) tablet 1,000 Units (has no administration in time range)  senna-docusate (Senokot-S) tablet 1 tablet (has no administration in time range)  doxycycline (VIBRAMYCIN) 100 mg in sodium chloride 0.9 % 250 mL IVPB (0 mg Intravenous Stopped 01/04/23 1942)    Mobility walks with person assist     Focused Assessments Cardiac Assessment Handoff:    Lab Results  Component Value Date   CKTOTAL 16 09/16/2009   CKMB 1.2 09/16/2009   TROPONINI (H) 09/16/2009    0.10        PERSISTENTLY INCREASED TROPONIN VALUES IN THE RANGE OF 0.06-0.49 ng/mL CAN BE SEEN IN:       -UNSTABLE ANGINA       -CONGESTIVE HEART FAILURE       -MYOCARDITIS       -CHEST TRAUMA       -ARRYHTHMIAS       -LATE PRESENTING MI       -COPD   CLINICAL  FOLLOW-UP RECOMMENDED.   No results found for: "DDIMER" Does the Patient currently have chest pain? No    R Recommendations: See Admitting Provider Note  Report given to:   Additional Notes:

## 2023-01-04 NOTE — Assessment & Plan Note (Signed)
-  left non-healing LE wound with purulent drainage and mild cellulitis. She was treated with a course of doxycycline about 3 weeks ago -continue IV antibiotics -wound care

## 2023-01-04 NOTE — Assessment & Plan Note (Addendum)
Acute CHF Community acquired pneumonia -previously on 2L O2 PRN but has been using it daily in the past month and half with increasing bilateral LE edema.  -CXR today with moderate right pleural effusion with bilateral opacity. Suspect acute congestive heart failure and possible new pneumonia -continue IV Rocephin and Doxycycline. Will also check COVID PCR. Procalcitonin is pending.  -required Bipap on presentation due to hypoxia to O2 70% and CO2 in the 70s, but now able to titrate down to 4L [place] -continue to maintain 02 >92%

## 2023-01-04 NOTE — Assessment & Plan Note (Signed)
-  had intermittent hypotension on arrival. Now stable on diltiazem infusion.

## 2023-01-04 NOTE — ED Notes (Signed)
RT called and at bedside.

## 2023-01-04 NOTE — Assessment & Plan Note (Addendum)
-  presented with HR in the 150. Likely exacerbated with suspected CHF and pneumonia -continue IV diltiazem infusion with goal >110 -continue Eliquis  -continue home digoxin

## 2023-01-04 NOTE — ED Notes (Addendum)
RT and MD at bedside adjusting bipap

## 2023-01-04 NOTE — ED Triage Notes (Signed)
Family is starting to fade between conversations and slur her speech x 1 week

## 2023-01-05 ENCOUNTER — Inpatient Hospital Stay (HOSPITAL_COMMUNITY): Payer: Medicare Other

## 2023-01-05 DIAGNOSIS — I5021 Acute systolic (congestive) heart failure: Secondary | ICD-10-CM

## 2023-01-05 DIAGNOSIS — J9622 Acute and chronic respiratory failure with hypercapnia: Secondary | ICD-10-CM | POA: Diagnosis not present

## 2023-01-05 DIAGNOSIS — J9621 Acute and chronic respiratory failure with hypoxia: Secondary | ICD-10-CM | POA: Diagnosis not present

## 2023-01-05 LAB — CBC
HCT: 42.1 % (ref 36.0–46.0)
Hemoglobin: 12.5 g/dL (ref 12.0–15.0)
MCH: 29.8 pg (ref 26.0–34.0)
MCHC: 29.7 g/dL — ABNORMAL LOW (ref 30.0–36.0)
MCV: 100.5 fL — ABNORMAL HIGH (ref 80.0–100.0)
Platelets: 258 10*3/uL (ref 150–400)
RBC: 4.19 MIL/uL (ref 3.87–5.11)
RDW: 14.3 % (ref 11.5–15.5)
WBC: 8 10*3/uL (ref 4.0–10.5)
nRBC: 0.4 % — ABNORMAL HIGH (ref 0.0–0.2)

## 2023-01-05 LAB — BASIC METABOLIC PANEL WITH GFR
Anion gap: 12 (ref 5–15)
BUN: 20 mg/dL (ref 8–23)
CO2: 35 mmol/L — ABNORMAL HIGH (ref 22–32)
Calcium: 10 mg/dL (ref 8.9–10.3)
Chloride: 93 mmol/L — ABNORMAL LOW (ref 98–111)
Creatinine, Ser: 0.81 mg/dL (ref 0.44–1.00)
GFR, Estimated: >60 mL/min (ref >60)
Glucose, Bld: 155 mg/dL — ABNORMAL HIGH (ref 70–99)
Potassium: 3.8 mmol/L (ref 3.5–5.1)
Sodium: 140 mmol/L (ref 135–145)

## 2023-01-05 LAB — PROCALCITONIN: Procalcitonin: <0.1 ng/mL

## 2023-01-05 LAB — MRSA NEXT GEN BY PCR, NASAL: MRSA by PCR Next Gen: NOT DETECTED

## 2023-01-05 LAB — ECHOCARDIOGRAM COMPLETE
Est EF: 55
Height: 62 in
P 1/2 time: 451 ms
S' Lateral: 3.8 cm
Weight: 3114.66 [oz_av]

## 2023-01-05 LAB — BASIC METABOLIC PANEL
Anion gap: 14 (ref 5–15)
BUN: 18 mg/dL (ref 8–23)
CO2: 38 mmol/L — ABNORMAL HIGH (ref 22–32)
Calcium: 9.6 mg/dL (ref 8.9–10.3)
Chloride: 90 mmol/L — ABNORMAL LOW (ref 98–111)
Creatinine, Ser: 0.88 mg/dL (ref 0.44–1.00)
GFR, Estimated: >60 mL/min (ref >60)
Glucose, Bld: 187 mg/dL — ABNORMAL HIGH (ref 70–99)
Potassium: 3.5 mmol/L (ref 3.5–5.1)
Sodium: 142 mmol/L (ref 135–145)

## 2023-01-05 MED ORDER — FUROSEMIDE 10 MG/ML IJ SOLN
60.0000 mg | Freq: Four times a day (QID) | INTRAMUSCULAR | Status: DC
  Administered 2023-01-05 – 2023-01-07 (×9): 60 mg via INTRAVENOUS
  Filled 2023-01-05 (×11): qty 6

## 2023-01-05 MED ORDER — DOXYCYCLINE HYCLATE 100 MG PO TABS
100.0000 mg | ORAL_TABLET | Freq: Two times a day (BID) | ORAL | Status: DC
  Administered 2023-01-05 – 2023-01-08 (×6): 100 mg via ORAL
  Filled 2023-01-05 (×6): qty 1

## 2023-01-05 NOTE — Progress Notes (Signed)
Pt resting comfortably on BIPAP at this time

## 2023-01-05 NOTE — Progress Notes (Signed)
PHARMACY - PHYSICIAN COMMUNICATION CRITICAL VALUE ALERT - BLOOD CULTURE IDENTIFICATION (BCID)  [name] is an 82 y.o. female who presented to [facility] on 01/04/2023 with a chief complaint of PNA/cellulitis  7/27 Blood>>>Gram + rods  Name of physician (or Provider) Contacted: Dr. Joneen Roach  Current antibiotics: Ceftriaxone/Doxycycline   Changes to prescribed antibiotics recommended:  No changes  Abran Duke, PharmD, BCPS Clinical Pharmacist Phone: [phone]

## 2023-01-05 NOTE — Progress Notes (Signed)
01/04/2023 2130 Received pt to room 2C04 from ED.  Pt is A&O, C/O SOB, pt  was increased to 5L [place] due to transfer into new bed.  Tele monitor applied and CCMD notified.  CHG bath given.  Oriented to room, call light and bed.  Call bell in reach, family at bedside. Kathryne Hitch

## 2023-01-05 NOTE — Progress Notes (Signed)
  Echocardiogram 2D Echocardiogram has been performed.  Delcie Roch 01/05/2023, 5:55 PM

## 2023-01-05 NOTE — Consult Note (Signed)
WOC Nurse Consult Note: Reason for Consult:LLE with chronic, nonhealing wounds. HHRN dresses several times weekly. Recently failed course of antibiotic therapy in the community. Wound type: venous insufficiency, infectious  Pressure Injury POA: N/A Measurement:Bedside RN to measure and document wound measurements on nursing flow sheet with application of next dressing today Periwound:edema, erythema Dressing procedure/placement/frequency: I will implement a conservative POC for these lesions using a silver hydrofiber (Aquacel Ag+ Advantage) applied after cleansing and topped with dry gauze, an ABD pad and secured with Kerlix roll gauze applied from just below toes to just below knee. This is to be topped with an ACE bandage applied in a similar manner Foot/Feet are to be placed into Prevalon boots. Turning and repositioning is in place and time in the supine position minimized.  WOC nursing team will not follow, but will remain available to this patient, the nursing and medical teams.  Please re-consult if needed.  Thank you for inviting [place] to participate in this patient's Plan of Care.  Ladona Mow, MSN, RN, CNS, GNP, Leda Min, [facility], [employer] phone:  [phone]        .

## 2023-01-05 NOTE — Progress Notes (Signed)
PROGRESS NOTE    [name]  WNU:[id number] DOB: 1940/10/07 DOA: 01/04/2023 PCP: Herma Carson, MD   Brief Narrative:  [name] is a 82 y.o. female with medical history significant of Takotsubo cardiomyopathy, CVA, DVT/PE, ventricular fibrillation, chronic hypoxemic respiratory failure on 2L who presents with weakness, labored respiration and altered mental status.  Hospitalist called to admit   Assessment & Plan:   Principal Problem:   Acute on chronic respiratory failure with hypoxia and hypercapnia (HCC) Active Problems:   CAP (community acquired pneumonia)   Paroxysmal atrial fibrillation with RVR (HCC)   Hypotension   Cellulitis   Acute congestive heart failure (HCC)   Acute on chronic respiratory failure with hypoxia and hypercapnia  Acute CHF exacerbation Rule out concurrent community acquired pneumonia -Patient respiratory status declined overnight, on BiPAP this morning, will attempt break now that has been 7 hours of BiPAP with improving urine output, if any respiratory distress or hypoxia uncontrolled by high-flow nasal cannula will replace BiPAP immediately -P.o. Bumex discontinued transition to IV Lasix 60 mg every 6 hours IV standing -follow afternoon BMP -Chronically on 2L O2 PRN -Chest x-ray showing right greater than left pleural effusion, lower extremity edema noted on exam presumed to be in florid heart failure -Unclear if due to noncompliance with lifestyle and dietary recommendations or medications -recommend following up with new cardiology team given uncontrolled heart failure, recommended heart failure team here if patient is amenable -Echo pending   Cellulitis, POA -left non-healing LE wound with purulent drainage and mild cellulitis, failure of doxycycline about 3 weeks ago -continue IV antibiotics x 5-7 days and follow clinically   Paroxysmal atrial fibrillation with RVR (HCC) -Provoked secondary to above -Wean dilt as tolerated - off home  metoprolol due to hypotension -continue Eliquis  -continue home digoxin   Hypotension -Borderline still this am - making diuresis difficult.  -Continue aggressive diuresis if MAP >60  DVT prophylaxis:  apixaban (ELIQUIS) tablet 5 mg   Code Status:   Code Status: Full Code  Family Communication: None present  Status is: Inpatient  Dispo: The patient is from: Home              Anticipated d/c is to: Home              Anticipated d/c date is: 48 to 72 hours              Patient currently not medically stable for discharge  Consultants:  None  Procedures:  None  Antimicrobials:  Ceftriaxone x 5 days  Subjective: No acute issues or events this morning, tolerating BiPAP quite well requesting to wean to nasal cannula so she can tolerate breakfast which we agreed was reasonable.  Otherwise denies nausea vomiting diarrhea constipation headache fevers chills or chest pain  Objective: Vitals:   01/05/23 0400 01/05/23 0404 01/05/23 0500 01/05/23 0600  BP: 115/65 115/65 (!) 156/123 117/68  Pulse:  (!) 131 (!) 122 (!) 125  Resp: (!) 33 (!) 24 (!) 24 (!) 23  Temp:  97.6 F (36.4 C)    TempSrc:  Oral    SpO2:   99% 91%  Weight:      Height:        Intake/Output Summary (Last 24 hours) at 01/05/2023 0744 Last data filed at 01/05/2023 0228 Gross per 24 hour  Intake 675 ml  Output 700 ml  Net -25 ml   Filed Weights   01/04/23 1250 01/04/23 2205  Weight: 90 kg 88.3  kg    Examination:  General:  Pleasantly resting in bed, No acute distress.  Tolerating BiPAP well HEENT:  Normocephalic atraumatic.  Sclerae nonicteric, noninjected.  Extraocular movements intact bilaterally. Neck:  Without mass or deformity.  Trachea is midline. Lungs: Bibasilar rales coarse upper airway sounds in the setting of leaking BiPAP mask. Heart: Tachycardic, irregularly irregular. Abdomen:  Soft, nontender, nondistended.  Without guarding or rebound. Extremities: 3+ pitting edema bilateral lower  extremities to the knee  Data Reviewed: I have personally reviewed following labs and imaging studies  CBC: Recent Labs  Lab 01/04/23 1250 01/04/23 1324 01/04/23 1341 01/04/23 1627 01/05/23 0113  WBC 6.8  --   --   --  8.0  NEUTROABS 4.8  --   --   --   --   HGB 12.7 13.9 13.9 13.6 12.5  HCT 43.8 41.0 41.0 40.0 42.1  MCV 101.4*  --   --   --  100.5*  PLT 286  --   --   --  258   Basic Metabolic Panel: Recent Labs  Lab 01/04/23 1250 01/04/23 1324 01/04/23 1341 01/04/23 1627 01/05/23 0113  NA 141 137 140 140 140  K 5.1 5.9* 4.0 4.0 3.8  CL 93*  --   --   --  93*  CO2 33*  --   --   --  35*  GLUCOSE 152*  --   --   --  155*  BUN 23  --   --   --  20  CREATININE 0.81  --   --   --  0.81  CALCIUM 10.3  --   --   --  10.0   GFR: Estimated Creatinine Clearance: 55.3 mL/min (by C-G formula based on SCr of 0.81 mg/dL). Liver Function Tests: Recent Labs  Lab 01/04/23 1250  AST 47*  ALT 29  ALKPHOS 126  BILITOT 1.2  PROT 6.6  ALBUMIN 3.1*   No results for input(s): "LIPASE", "AMYLASE" in the last 168 hours. No results for input(s): "AMMONIA" in the last 168 hours. Coagulation Profile: Recent Labs  Lab 01/04/23 1250  INR 1.9*   Cardiac Enzymes: No results for input(s): "CKTOTAL", "CKMB", "CKMBINDEX", "TROPONINI" in the last 168 hours. BNP (last 3 results) No results for input(s): "PROBNP" in the last 8760 hours. HbA1C: No results for input(s): "HGBA1C" in the last 72 hours. CBG: No results for input(s): "GLUCAP" in the last 168 hours. Lipid Profile: No results for input(s): "CHOL", "HDL", "LDLCALC", "TRIG", "CHOLHDL", "LDLDIRECT" in the last 72 hours. Thyroid Function Tests: No results for input(s): "TSH", "T4TOTAL", "FREET4", "T3FREE", "THYROIDAB" in the last 72 hours. Anemia Panel: No results for input(s): "VITAMINB12", "FOLATE", "FERRITIN", "TIBC", "IRON", "RETICCTPCT" in the last 72 hours. Sepsis Labs: Recent Labs  Lab 01/04/23 1314 01/04/23 1425  01/04/23 1556 01/04/23 1717 01/05/23 0113  PROCALCITON  --   --   --  <0.10 <0.10  LATICACIDVEN 2.0* 3.1* 2.1*  --   --     Recent Results (from the past 240 hour(s))  SARS Coronavirus 2 by RT PCR (hospital order, performed in [facility] hospital lab) *cepheid single result test* Anterior Nasal Swab     Status: None   Collection Time: 01/04/23  7:12 PM   Specimen: Anterior Nasal Swab  Result Value Ref Range Status   SARS Coronavirus 2 by RT PCR NEGATIVE NEGATIVE Final    Comment: Performed at [facility], [address], [place], [place] [address]  MRSA Next  Gen by PCR, Nasal     Status: None   Collection Time: 01/05/23  1:40 AM   Specimen: Nasal Mucosa; Nasal Swab  Result Value Ref Range Status   MRSA by PCR Next Gen NOT DETECTED NOT DETECTED Final    Comment: (NOTE) The GeneXpert MRSA Assay (FDA approved for NASAL specimens only), is one component of a comprehensive MRSA colonization surveillance program. It is not intended to diagnose MRSA infection nor to guide or monitor treatment for MRSA infections. Test performance is not FDA approved in patients less than 35 years old. Performed at [facility], [address], [place], [place] [address]          Radiology Studies: DG Chest Port 1 View  Result Date: 01/04/2023 CLINICAL DATA:  Sepsis EXAM: PORTABLE CHEST 1 VIEW COMPARISON:  X-ray 09/20/22 FINDINGS: Developing moderate right effusion and adjacent lung opacity. Film is rotated to the right. Enlarged cardiopericardial silhouette with a tortuous aorta. Interstitial prominence again seen. Apical pleural thickening. Subtle patchy opacity left lung base as well. No pneumothorax. Overlapping cardiac leads. Osteopenia. IMPRESSION: Developing moderate right effusion and adjacent lung opacities. Mild opacity left lung base. Recommend follow-up. Acute process is possible such as infiltrate Electronically Signed   By: Karen Kays M.D.   On: 01/04/2023 13:36         Scheduled Meds:  allopurinol  100 mg Oral Daily   apixaban  5 mg Oral BID   aspirin  81 mg Oral Daily   bumetanide  2 mg Oral Daily   buPROPion  300 mg Oral Daily   calcium carbonate  1 tablet Oral BID WC   cholecalciferol  1,000 Units Oral Daily   cyanocobalamin  1,000 mcg Oral Daily   digoxin  0.125 mg Oral Daily   doxycycline  100 mg Oral Q12H   gabapentin  900 mg Oral Daily   metoprolol tartrate  25 mg Oral BID   pantoprazole  40 mg Oral Daily   rosuvastatin  5 mg Oral Daily   senna-docusate  1 tablet Oral BID   traMADol  50 mg Oral BID   Continuous Infusions:  cefTRIAXone (ROCEPHIN)  IV Stopped (01/04/23 1559)   diltiazem (CARDIZEM) infusion 15 mg/hr (01/04/23 2339)     LOS: 1 day   Time spent:  Azucena Fallen, DO [facility]  If 7PM-7AM, please contact night-coverage [email or web address]  01/05/2023, 7:44 AM

## 2023-01-05 NOTE — Progress Notes (Signed)
RT called to bedside. Pt experiencing SOB and an increased WOB with accessory muscle use. Pt placed back on bipap and is tolerating it well at this time.

## 2023-01-05 NOTE — Progress Notes (Signed)
Heart rate is too high for accurate echo at this time. 

## 2023-01-05 NOTE — Progress Notes (Signed)
Pt taken off bipap per MD and placed on HFNC (salter) 8L, sats 95%. Pt seems to be tolerating it well and is in no distress at this time. RT will monitor as needed.

## 2023-01-06 ENCOUNTER — Other Ambulatory Visit (HOSPITAL_COMMUNITY): Payer: Self-pay

## 2023-01-06 DIAGNOSIS — J9621 Acute and chronic respiratory failure with hypoxia: Secondary | ICD-10-CM | POA: Diagnosis not present

## 2023-01-06 DIAGNOSIS — J9622 Acute and chronic respiratory failure with hypercapnia: Secondary | ICD-10-CM | POA: Diagnosis not present

## 2023-01-06 LAB — URINE CULTURE

## 2023-01-06 NOTE — TOC Initial Note (Signed)
Transition of Care [facility]) - Initial/Assessment Note    Patient Details  Name: [name] MRN: [id number] Date of Birth: 12/18/1940  [facility] CM/SW Contact:    Megan Masson, RN Phone Number: 01/06/2023, 3:32 PM  Clinical Narrative:                 Spoke to daughter, [name], regarding transition needs.  Patient lives with son and has home concentrator from [place]. Patient will need portable tank at discharge.  Patient has home health RN every other day to do wound care but daughter and son are unsure of which home health agency and no Home health record on [employer]. Address, Phone number and PCP verified.   Expected Discharge Plan: Home/Self Care Barriers to Discharge: Continued Medical Work up   Patient Goals and CMS Choice Patient states their goals for this hospitalization and ongoing recovery are:: return home          Expected Discharge Plan and Services       Living arrangements for the past 2 months: Single Family Home                                      Prior Living Arrangements/Services Living arrangements for the past 2 months: Single Family Home Lives with:: Adult Children (son) Patient language and need for interpreter reviewed:: Yes Do you feel safe going back to the place where you live?: Yes      Need for Family Participation in Patient Care: Yes (Comment) Care giver support system in place?: Yes (comment)   Criminal Activity/Legal Involvement Pertinent to Current Situation/Hospitalization: No - Comment as needed  Activities of Daily Living      Permission Sought/Granted                  Emotional Assessment              Admission diagnosis:  Atrial fibrillation with rapid ventricular response (HCC) [I48.91] Acute respiratory failure with hypoxia and hypercapnia (HCC) [J96.01, J96.02] Acute on chronic respiratory failure with hypoxia and hypercapnia (HCC) [F64.33, J96.22] Patient Active Problem List    Diagnosis Date Noted   Acute on chronic respiratory failure with hypoxia and hypercapnia (HCC) 01/04/2023   CAP (community acquired pneumonia) 01/04/2023   Paroxysmal atrial fibrillation with RVR (HCC) 01/04/2023   Hypotension 01/04/2023   Cellulitis 01/04/2023   Acute congestive heart failure (HCC) 01/04/2023   SOB (shortness of breath) 06/16/2013   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   Broken heart syndrome 11/26/2012   DVT (deep venous thrombosis) (HCC) 11/26/2012   Acute pulmonary embolism (HCC) 11/26/2012   DOE (dyspnea on exertion) 11/26/2012   Stress incontinence 11/26/2012   PCP:  Megan Carson, MD Pharmacy:   [id number] [address]), [place] - [address] [place] ([place]) [place] [address] Phone: [phone] Fax: [phone]     Social Determinants of Health (SDOH) Social History: SDOH Screenings   Food Insecurity: Low Risk  (07/15/2022)   Received from [facility] visits prior to 08/10/2022.  Transportation Needs: No Transportation Needs (07/15/2022)   [facility] prior to 08/10/2022.  Utilities: Low Risk  (09/19/2022)   [facility] from [facility]  Tobacco Use: Medium Risk (01/04/2023)   SDOH Interventions:     Readmission Risk Interventions     No data to  display

## 2023-01-06 NOTE — Progress Notes (Incomplete)
Heart Failure Nurse Navigator Progress Note  PCP: Herma Carson, MD PCP-Cardiologist: Rolanda Jay ([facility]) Admission Diagnosis: *** Admitted from: ***  Presentation:   [name] presented with ***  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   EF - 55-60, Tricuspid valve - mild-moderate regurgitation; 12/14/2009 - EF-55, mild to moderate tricuspid regurgitation     Social History   Socioeconomic History   Marital status: Widowed    Spouse name: Not on file   Number of children: 3   Years of education: Not on file   Highest education level: Not on file  Occupational History   Not on file  Tobacco Use   Smoking status: Former    Current packs/day: 0.00    Average packs/day: 1.5 packs/day for 51.0 years (76.5 ttl pk-yrs)    Types: Cigarettes    Start date: 06/11/1955    Quit date: 06/10/2006    Years since quitting: 16.5   Smokeless tobacco: Never  Vaping Use   Vaping status: Never Used  Substance and Sexual Activity   Alcohol use: No   Drug use: No   Sexual activity: Not on file  Other Topics Concern   Not on file  Social History Narrative   Not on file   Social Determinants of Health   Financial Resource Strain: Not on file  Food Insecurity: Low Risk  (07/15/2022)   [facility] prior to 08/10/2022.   Food    Within the past 12 months, you worried that your food would run out before you got money to buy more food: Never true    Within the past 12 months, the food you bought just didn't last and you didn't have money to get more: Never true  Transportation Needs: No Transportation Needs (07/15/2022)   Received from [facility]  visits prior to 08/10/2022.   Transportation    In the past 12 months, has lack of reliable transportation kept you from medical appointments, meetings, work or from getting things needed for daily living?: No  Physical Activity: Not on file  Stress: Not on file  Social Connections: Not on file   Education Assessment and Provision:  Detailed education and instructions provided on heart failure disease management including the following:  Signs and symptoms of Heart Failure When to call the physician Importance of daily weights Low sodium diet Fluid restriction Medication management Anticipated future follow-up appointments  Patient education given on each of the above topics.  Patient acknowledges understanding via teach back method and acceptance of all instructions.  Education Materials:  "Living Better With Heart Failure" Booklet, HF zone tool, & Daily Weight Tracker Tool.  Patient has scale at home: *** Patient has pill box at home: ***     High Risk Criteria for Readmission and/or Poor Patient Outcomes: Heart failure hospital admissions (last 6 months): ***  No Show rate: *** Difficult social situation: *** Demonstrates medication adherence: *** Primary Language: *** Literacy level: ***  Barriers of Care:   ***  Considerations/Referrals:   Referral made to Heart Failure Pharmacist Stewardship: *** Referral made to Heart Failure CSW/NCM TOC: *** Referral made to Heart & Vascular TOC clinic: ***  Items for Follow-up on DC/TOC: ***   ***

## 2023-01-06 NOTE — Progress Notes (Signed)
Pt taken off bipap and placed on HFNC. Pt tolerating well at this time, spo2 93%,RN notified, RT will monitor as needed.     01/06/23 1642  Therapy Vitals  Resp (!) 22  Respiratory Assessment  Assessment Type Assess only  Respiratory Pattern Regular;Unlabored  Chest Assessment Chest expansion symmetrical  Cough None  Bilateral Breath Sounds Diminished  Oxygen Therapy/Pulse Ox  O2 Device HFNC  O2 Therapy Oxygen humidified  O2 Flow Rate (L/min) 9 L/min

## 2023-01-06 NOTE — Progress Notes (Signed)
Dressing applied to Left LE wound as ordered,wound cleansed with soap and water, rinsed and dried dressing applied per order with silver hydrofiber, gauze, abd pad, kerlix roll, paper tape and 6" ace wrap.

## 2023-01-06 NOTE — TOC Benefit Eligibility Note (Signed)
Pharmacy Patient Advocate Encounter  Insurance verification completed.    The patient is insured through [facility]    Ran test claim for [place] and the current 30 day co-pay is $47.00.   This test claim was processed through [facility]- copay amounts may vary at other pharmacies due to pharmacy/plan contracts, or as the patient moves through the different stages of their insurance plan.

## 2023-01-06 NOTE — Progress Notes (Signed)
PROGRESS NOTE    [name]  ZOX:[id number] DOB: 1941/05/28 DOA: 01/04/2023 PCP: Herma Carson, MD   Brief Narrative:  [name] is a 82 y.o. female with medical history significant of Takotsubo cardiomyopathy, CVA, DVT/PE, ventricular fibrillation, chronic hypoxemic respiratory failure on 2L who presents with weakness, labored respiration and altered mental status.  Hospitalist called to admit   Assessment & Plan:   Principal Problem:   Acute on chronic respiratory failure with hypoxia and hypercapnia (HCC) Active Problems:   CAP (community acquired pneumonia)   Paroxysmal atrial fibrillation with RVR (HCC)   Hypotension   Cellulitis   Acute congestive heart failure (HCC)   Acute on chronic respiratory failure with hypoxia and hypercapnia  Acute CHF exacerbation Rule out concurrent community acquired pneumonia -Patient respiratory status finally stabilizing on BiPAP, breaks to high flow -Continue aggressive IV Lasix 60 mg every 6 hours, creatinine stable -Chronically on 2L O2 PRN -Chest x-ray showing right greater than left pleural effusion, lower extremity edema noted on exam presumed to be in florid heart failure -Unclear if due to noncompliance with lifestyle and dietary recommendations or medications - recommend following up with new cardiology team given uncontrolled heart failure, recommended heart failure team here if patient is amenable -Echo pending   Cellulitis, POA -left non-healing LE wound with purulent drainage and mild cellulitis, failure of doxycycline about 3 weeks ago -continue IV doxycycline x 5-7 days and follow clinically   Paroxysmal atrial fibrillation with RVR (HCC) -Provoked secondary to above -Wean dilt as tolerated - off home metoprolol due to hypotension -continue Eliquis  -continue home digoxin   Hypotension -Chronic, if necessary hold metoprolol to avoid hypotension while diuresing -Continue aggressive diuresis if MAP >60  DVT  prophylaxis:  apixaban (ELIQUIS) tablet 5 mg   Code Status:   Code Status: Full Code  Family Communication: None present  Status is: Inpatient  Dispo: The patient is from: Home              Anticipated d/c is to: Home              Anticipated d/c date is: 48 to 72 hours              Patient currently not medically stable for discharge  Consultants:  None  Procedures:  None  Antimicrobials:  Ceftriaxone x 5 days  Subjective: No acute issues or events overnight, tolerating BiPAP well currently on high flow cannula for break and p.o. intake.  Respiratory status still tenuous, patient indicates she feels "about the same" as when she was admitted.  Denies nausea vomiting headache fevers chills or chest pain  Objective: Vitals:   01/05/23 2300 01/06/23 0000 01/06/23 0316 01/06/23 0350  BP: 109/83 102/85 106/77   Pulse: 93 93 90   Resp: (!) 25 (!) 25 20   Temp:  97.8 F (36.6 C) 97.9 F (36.6 C)   TempSrc:  Axillary Axillary   SpO2: 92% 94% 91%   Weight:    88.5 kg  Height:        Intake/Output Summary (Last 24 hours) at 01/06/2023 0648 Last data filed at 01/06/2023 0324 Gross per 24 hour  Intake 1623.99 ml  Output 525 ml  Net 1098.99 ml   Filed Weights   01/04/23 1250 01/04/23 2205 01/06/23 0350  Weight: 90 kg 88.3 kg 88.5 kg    Examination:  General:  Pleasantly resting in bed, No acute distress. Tolerating high flow cannula HEENT:  Normocephalic atraumatic.  Sclerae nonicteric, noninjected.  Extraocular movements intact bilaterally. Neck:  Without mass or deformity.  Trachea is midline. Lungs: Bibasilar rales with coarse biapical rhonchi, tachypneic minimal accessory muscle use Heart: Tachycardic, irregularly irregular. Abdomen:  Soft, nontender, nondistended.  Without guarding or rebound. Extremities: 3+ pitting edema bilateral lower extremities to the knee  Data Reviewed: I have personally reviewed following labs and imaging studies  CBC: Recent Labs   Lab 01/04/23 1250 01/04/23 1324 01/04/23 1341 01/04/23 1627 01/05/23 0113  WBC 6.8  --   --   --  8.0  NEUTROABS 4.8  --   --   --   --   HGB 12.7 13.9 13.9 13.6 12.5  HCT 43.8 41.0 41.0 40.0 42.1  MCV 101.4*  --   --   --  100.5*  PLT 286  --   --   --  258   Basic Metabolic Panel: Recent Labs  Lab 01/04/23 1250 01/04/23 1324 01/04/23 1341 01/04/23 1627 01/05/23 0113 01/05/23 1429  NA 141 137 140 140 140 142  K 5.1 5.9* 4.0 4.0 3.8 3.5  CL 93*  --   --   --  93* 90*  CO2 33*  --   --   --  35* 38*  GLUCOSE 152*  --   --   --  155* 187*  BUN 23  --   --   --  20 18  CREATININE 0.81  --   --   --  0.81 0.88  CALCIUM 10.3  --   --   --  10.0 9.6   GFR: Estimated Creatinine Clearance: 51 mL/min (by C-G formula based on SCr of 0.88 mg/dL). Liver Function Tests: Recent Labs  Lab 01/04/23 1250  AST 47*  ALT 29  ALKPHOS 126  BILITOT 1.2  PROT 6.6  ALBUMIN 3.1*   Coagulation Profile: Recent Labs  Lab 01/04/23 1250  INR 1.9*   Sepsis Labs: Recent Labs  Lab 01/04/23 1314 01/04/23 1425 01/04/23 1556 01/04/23 1717 01/05/23 0113  PROCALCITON  --   --   --  <0.10 <0.10  LATICACIDVEN 2.0* 3.1* 2.1*  --   --     Recent Results (from the past 240 hour(s))  Culture, blood (Routine x 2)     Status: None (Preliminary result)   Collection Time: 01/04/23 12:50 PM   Specimen: BLOOD  Result Value Ref Range Status   Specimen Description BLOOD SITE NOT SPECIFIED  Final   Special Requests   Final    BOTTLES DRAWN AEROBIC AND ANAEROBIC Blood Culture results may not be optimal due to an inadequate volume of blood received in culture bottles   Culture   Final    NO GROWTH < 24 HOURS Performed at [facility], [address] [place], [place] [address]    Report Status PENDING  Incomplete  Culture, blood (Routine x 2)     Status: None (Preliminary result)   Collection Time: 01/04/23 12:55 PM   Specimen: BLOOD  Result Value Ref Range Status   Specimen  Description BLOOD SITE NOT SPECIFIED  Final   Special Requests   Final    BOTTLES DRAWN AEROBIC AND ANAEROBIC Blood Culture results may not be optimal due to an inadequate volume of blood received in culture bottles   Culture  Setup Time   Final    GRAM POSITIVE RODS ANAEROBIC BOTTLE ONLY CRITICAL RESULT CALLED TO, READ BACK BY AND VERIFIED WITH: PHARMD J. LEDFORD 01/05/23 @ 2133 BY  AB Performed at [facility], [address], [place], [place] [address]    Culture GRAM POSITIVE RODS  Final   Report Status PENDING  Incomplete  SARS Coronavirus 2 by RT PCR (hospital order, performed in [facility] hospital lab) *cepheid single result test* Anterior Nasal Swab     Status: None   Collection Time: 01/04/23  7:12 PM   Specimen: Anterior Nasal Swab  Result Value Ref Range Status   SARS Coronavirus 2 by RT PCR NEGATIVE NEGATIVE Final    Comment: Performed at [facility], [address], [place], [place] [address]  MRSA Next Gen by PCR, Nasal     Status: None   Collection Time: 01/05/23  1:40 AM   Specimen: Nasal Mucosa; Nasal Swab  Result Value Ref Range Status   MRSA by PCR Next Gen NOT DETECTED NOT DETECTED Final    Comment: (NOTE) The GeneXpert MRSA Assay (FDA approved for NASAL specimens only), is one component of a comprehensive MRSA colonization surveillance program. It is not intended to diagnose MRSA infection nor to guide or monitor treatment for MRSA infections. Test performance is not FDA approved in patients less than 82 years old. Performed at [facility], [address] [place], [place] [address]          Radiology Studies: ECHOCARDIOGRAM COMPLETE  Result Date: 01/05/2023    ECHOCARDIOGRAM REPORT   Patient Name:   JULYANA BEDRICK Date of Exam: 01/05/2023 Medical Rec #:  [id number]      Height:       62.0 in Accession #:    [id number]     Weight:       194.7 lb Date of Birth:  07-11-1940      BSA:          1.890 m Patient Age:    82 years       BP:            102/63 mmHg Patient Gender: F              HR:           102 bpm. Exam Location:  Inpatient Procedure: 2D Echo, Cardiac Doppler and Color Doppler Indications:    acute systolic chf  History:        Patient has prior history of Echocardiogram examinations, most                 recent 07/26/2013. History of Takotsubo, Arrythmias:Atrial                 Fibrillation; Signs/Symptoms:Shortness of Breath.  Sonographer:    Delcie Roch RDCS Referring Phys: [id number] CHING T TU IMPRESSIONS  1. Left ventricular ejection fraction, by estimation, is 55% with beat to beat variability. The left ventricle has normal function. The left ventricle has no regional wall motion abnormalities. There is mild left ventricular hypertrophy. Left ventricular diastolic parameters are indeterminate.  2. Right ventricular systolic function is mildly reduced. The right ventricular size is moderately enlarged. There is moderately elevated pulmonary artery systolic pressure. The estimated right ventricular systolic pressure is 50.5 mmHg.  3. Left atrial size was severely dilated.  4. Right atrial size was severely dilated.  5. The mitral valve is degenerative. Severe mitral valve regurgitation. No evidence of mitral stenosis. Severe mitral annular calcification.  6. 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  7. The aortic valve is tricuspid. There is mild calcification of the aortic valve. Aortic valve regurgitation  is mild. No aortic stenosis is present.  8. The inferior vena cava is dilated in size with <50% respiratory variability, suggesting right atrial pressure of 15 mmHg. FINDINGS  Left Ventricle: Left ventricular ejection fraction, by estimation, is 55%. The left ventricle has normal function. The left ventricle has no regional wall motion abnormalities. The left ventricular internal cavity size was normal in size. There is mild left ventricular hypertrophy. Left ventricular diastolic parameters are indeterminate. Right Ventricle: The  right ventricular size is moderately enlarged. No increase in right ventricular wall thickness. Right ventricular systolic function is mildly reduced. There is moderately elevated pulmonary artery systolic pressure. The tricuspid regurgitant velocity is 2.98 m/s, and with an assumed right atrial pressure of 15 mmHg, the estimated right ventricular systolic pressure is 50.5 mmHg. Left Atrium: Left atrial size was severely dilated. Right Atrium: Right atrial size was severely dilated. Pericardium: There is no evidence of pericardial effusion. Mitral Valve: The mitral valve is degenerative in appearance. Severe mitral annular calcification. Severe mitral valve regurgitation. No evidence of mitral valve stenosis. The mean mitral valve gradient is 3.0 mmHg with average heart rate of 101 bpm. Tricuspid Valve: The tricuspid valve is normal in structure. 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 No evidence of tricuspid stenosis. Aortic Valve: The aortic valve is tricuspid. There is mild calcification of the aortic valve. Aortic valve regurgitation is mild. Aortic regurgitation PHT measures 451 msec. No aortic stenosis is present. Pulmonic Valve: The pulmonic valve was normal in structure. Pulmonic valve regurgitation is trivial. No evidence of pulmonic stenosis. Aorta: The aortic root is normal in size and structure. Venous: The inferior vena cava is dilated in size with less than 50% respiratory variability, suggesting right atrial pressure of 15 mmHg. The inferior vena cava and the hepatic vein show a pattern of systolic flow reversal, suggestive of tricuspid regurgitation. IAS/Shunts: No atrial level shunt detected by color flow Doppler. Additional Comments: There is pleural effusion in the right lateral region.  LEFT VENTRICLE PLAX 2D LVIDd:         4.80 cm LVIDs:         3.80 cm LV PW:         1.10 cm LV IVS:        1.20 cm LVOT diam:     2.10 cm LVOT Area:     3.46 cm  RIGHT VENTRICLE             IVC RV Basal diam:   3.60 cm     IVC diam: 2.60 cm RV S prime:     10.90 cm/s TAPSE (M-mode): 1.1 cm LEFT ATRIUM              Index        RIGHT ATRIUM           Index LA diam:        5.10 cm  2.70 cm/m   RA Area:     29.70 cm LA Vol (A2C):   126.0 ml 66.67 ml/m  RA Volume:   104.00 ml 55.03 ml/m LA Vol (A4C):   94.0 ml  49.74 ml/m LA Biplane Vol: 109.0 ml 57.68 ml/m  AORTIC VALVE AI PHT:      451 msec  AORTA Ao Root diam: 2.80 cm Ao Asc diam:  3.60 cm MITRAL VALVE           TRICUSPID VALVE MV Mean grad: 3.0 mmHg TR Peak grad:   35.5 mmHg  TR Vmax:        298.00 cm/s                         SHUNTS                        Systemic Diam: 2.10 cm Weston Brass MD Electronically signed by Weston Brass MD Signature Date/Time: 01/05/2023/8:37:43 PM    Final    DG Chest Port 1 View  Result Date: 01/04/2023 CLINICAL DATA:  Sepsis EXAM: PORTABLE CHEST 1 VIEW COMPARISON:  X-ray 09/20/22 FINDINGS: Developing moderate right effusion and adjacent lung opacity. Film is rotated to the right. Enlarged cardiopericardial silhouette with a tortuous aorta. Interstitial prominence again seen. Apical pleural thickening. Subtle patchy opacity left lung base as well. No pneumothorax. Overlapping cardiac leads. Osteopenia. IMPRESSION: Developing moderate right effusion and adjacent lung opacities. Mild opacity left lung base. Recommend follow-up. Acute process is possible such as infiltrate Electronically Signed   By: Karen Kays M.D.   On: 01/04/2023 13:36        Scheduled Meds:  allopurinol  100 mg Oral Daily   apixaban  5 mg Oral BID   aspirin  81 mg Oral Daily   buPROPion  300 mg Oral Daily   calcium carbonate  1 tablet Oral BID WC   cholecalciferol  1,000 Units Oral Daily   cyanocobalamin  1,000 mcg Oral Daily   digoxin  0.125 mg Oral Daily   doxycycline  100 mg Oral Q12H   furosemide  60 mg Intravenous Q6H   gabapentin  900 mg Oral Daily   metoprolol tartrate  25 mg Oral BID   pantoprazole  40 mg Oral  Daily   rosuvastatin  5 mg Oral Daily   senna-docusate  1 tablet Oral BID   traMADol  50 mg Oral BID   Continuous Infusions:  cefTRIAXone (ROCEPHIN)  IV 2 g (01/05/23 1455)   diltiazem (CARDIZEM) infusion 10 mg/hr (01/06/23 0101)     LOS: 2 days   Time spent:  Azucena Fallen, DO [facility]  If 7PM-7AM, please contact night-coverage [email or web address]  01/06/2023, 6:48 AM

## 2023-01-06 NOTE — Plan of Care (Signed)

## 2023-01-07 DIAGNOSIS — J9621 Acute and chronic respiratory failure with hypoxia: Secondary | ICD-10-CM | POA: Diagnosis not present

## 2023-01-07 DIAGNOSIS — J9622 Acute and chronic respiratory failure with hypercapnia: Secondary | ICD-10-CM | POA: Diagnosis not present

## 2023-01-07 LAB — BASIC METABOLIC PANEL
Anion gap: 13 (ref 5–15)
BUN: 19 mg/dL (ref 8–23)
CO2: 36 mmol/L — ABNORMAL HIGH (ref 22–32)
Calcium: 8.9 mg/dL (ref 8.9–10.3)
Chloride: 88 mmol/L — ABNORMAL LOW (ref 98–111)
Creatinine, Ser: 0.88 mg/dL (ref 0.44–1.00)
GFR, Estimated: >60 mL/min (ref >60)
Glucose, Bld: 187 mg/dL — ABNORMAL HIGH (ref 70–99)
Potassium: 3.4 mmol/L — ABNORMAL LOW (ref 3.5–5.1)
Sodium: 137 mmol/L (ref 135–145)

## 2023-01-07 MED ORDER — DILTIAZEM HCL ER 60 MG PO CP12
60.0000 mg | ORAL_CAPSULE | Freq: Two times a day (BID) | ORAL | Status: DC
  Administered 2023-01-07: 60 mg via ORAL
  Filled 2023-01-07 (×2): qty 1

## 2023-01-07 NOTE — Progress Notes (Signed)
   Heart Failure Stewardship Pharmacist Progress Note   PCP: Herma Carson, MD PCP-Cardiologist: None    HPI:  82 yo F with PMH of DVT/PE, CHF (Takotsubo cardiomyopathy), afib, and stroke  Presented to the ED on 7/27 with weakness, shortness of breath, LE edema, AMS, and slurred speech. Tried increasing bumex as outpatient but did not see significant change to edema. CXR with moderate R pleural effusion, suspected HF and PNA. ECHO 7/28 showed LVEF 55%, no RWMA, mild LVH, RV mildly reduced, moderately elevated PA pressure, severe MR, severe TR. Has been on and off bipap.    Current HF Medications: Diuretic: furosemide 60 mg IV q6h Beta Blocker: metoprolol tartrate 25 mg BID Other: digoxin 0.125 mg daily  Prior to admission HF Medications: Diuretic: bumex 2 mg daily Beta blocker: metoprolol tartrate 25 mg BID Other: digoxin 0.125 mg daily  Pertinent Lab Values: Serum creatinine 0.88, BUN 18, Potassium 3.5, Sodium 142, BNP 563.3, Digoxin <0.2 (7/27)  Vital Signs: Weight: 196 lbs (admission weight: 194 lbs) Blood pressure: 100-110/60s  Heart rate: 80s  I/O: net -0.3L yesterday; net +0.7L since admission  Medication Assistance / Insurance Benefits Check: Does the patient have prescription insurance?  Yes Type of insurance plan: UHC Medicare  Does the patient qualify for medication assistance through manufacturers or grants?   Pending Eligible grants and/or patient assistance programs: pending Medication assistance applications in progress: none  Medication assistance applications approved: none Approved medication assistance renewals will be completed by: pending  Outpatient Pharmacy:  Prior to admission outpatient pharmacy: [facility] Is the patient willing to use [facility] T[facility] pharmacy at discharge? Yes Is the patient willing to transition their outpatient pharmacy to utilize a [facility] outpatient pharmacy?   Pending    Assessment: 1. Acute on chronic diastolic CHF (LVEF  55-60%), due to NICM. NYHA class III-IV symptoms. - Continue furosemide 60 mg IV q6h. Minimal urine output. Consider metolazone to augment diuresis.  - Consider holding metoprolol if signs of low output - Hold ACE/ARB/ARNI and MRA to allow for aggressive diuresis - Consider adding SGLT2i prior to discharge pending mobility - Continue digoxin 0.125 mg daily   Plan: 1) Medication changes recommended at this time: - Add metolazone  2) Patient assistance: - Jardiance/Farxiga $47  3)  Education  - To be completed prior to discharge  Sharen Hones, PharmD, BCPS Heart Failure Stewardship Pharmacist Phone [phone]

## 2023-01-07 NOTE — Care Management (Signed)
Confirmed with [facility] that patient is active for [facility] RN, resumption order added

## 2023-01-07 NOTE — Care Management Important Message (Signed)
Important Message  Patient Details  Name: [name] MRN: [id number] Date of Birth: 05/01/1941   Medicare Important Message Given:  Yes     Dorena Bodo 01/07/2023, 2:53 PM

## 2023-01-07 NOTE — Progress Notes (Signed)
Per verbal order from Dr. Natale Milch. Stop IV Cardizem. Pt heart rate 80's. Pt given PO Cardizem.

## 2023-01-07 NOTE — Progress Notes (Signed)
PROGRESS NOTE    [name]  ZOX:[id number] DOB: [birth year] DOA: 01/04/2023 PCP: Herma Carson, MD   Brief Narrative:  [name] is a 82 y.o. female with medical history significant of Takotsubo cardiomyopathy, CVA, DVT/PE, ventricular fibrillation, chronic hypoxemic respiratory failure on 2L who presents with weakness, labored respiration and altered mental status.  Hospitalist called to admit  Assessment & Plan:   Principal Problem:   Acute on chronic respiratory failure with hypoxia and hypercapnia (HCC) Active Problems:   CAP (community acquired pneumonia)   Paroxysmal atrial fibrillation with RVR (HCC)   Hypotension   Cellulitis   Acute congestive heart failure (HCC)   Acute on chronic respiratory failure with hypoxia and hypercapnia  Acute CHF (diastolic) exacerbation Rule out concurrent community acquired pneumonia -Patient respiratory status finally stabilizing on BiPAP, breaks to high flow -Continue aggressive IV Lasix 60 mg every 6 hours, creatinine stable -Chronically on 2L O2 PRN -Chest x-ray showing right greater than left pleural effusion, lower extremity edema noted on exam presumed to be in florid heart failure -Unclear if due to noncompliance with lifestyle and dietary recommendations or medications - recommend following up with new cardiology team given uncontrolled heart failure being managed by PCP -Echo - 55% unclear diastolic parameters; severe mitral regurg, severe tricuspid regurg; Ao regurg mild.   Cellulitis, POA -left non-healing LE wound with purulent drainage and mild cellulitis, failure of doxycycline about 3 weeks ago -continue IV doxycycline x 5-7 days and follow clinically   Paroxysmal atrial fibrillation with RVR (HCC) -Provoked secondary to above -Wean dilt as tolerated - off home metoprolol due to hypotension - transition to PO diltiazem if tolerated -continue Eliquis  -continue home digoxin   Hypotension -Chronic, if necessary  hold metoprolol to avoid hypotension while diuresing -Continue aggressive diuresis if MAP >60  DVT prophylaxis:  apixaban (ELIQUIS) tablet 5 mg   Code Status:   Code Status: Full Code  Family Communication: None present  Status is: Inpatient  Dispo: The patient is from: Home              Anticipated d/c is to: Home              Anticipated d/c date is: 48 to 72 hours              Patient currently not medically stable for discharge  Consultants:  None  Procedures:  None  Antimicrobials:  Ceftriaxone x 5 days  Subjective: No acute issues or events overnight, tolerating BiPAP well currently on high flow cannula for break and p.o. intake.  Respiratory status still tenuous, patient indicates she feels "about the same" as when she was admitted.  Denies nausea vomiting headache fevers chills or chest pain  Objective: Vitals:   01/06/23 1958 01/06/23 2151 01/06/23 2327 01/07/23 0307  BP: (!) 118/96 97/68 100/86 119/66  Pulse:  84 90 88  Resp:   18 20  Temp: 97.9 F (36.6 C)  97.6 F (36.4 C) 97.6 F (36.4 C)  TempSrc: Oral  Oral Oral  SpO2:   95% 93%  Weight:    89.3 kg  Height:        Intake/Output Summary (Last 24 hours) at 01/07/2023 0727 Last data filed at 01/07/2023 0326 Gross per 24 hour  Intake 660.47 ml  Output 800 ml  Net -139.53 ml   Filed Weights   01/04/23 2205 01/06/23 0350 01/07/23 0307  Weight: 88.3 kg 88.5 kg 89.3 kg    Examination:  General:  Pleasantly resting in bed, No acute distress. Tolerating high flow cannula HEENT:  Normocephalic atraumatic.  Sclerae nonicteric, noninjected.  Extraocular movements intact bilaterally. Neck:  Without mass or deformity.  Trachea is midline. Lungs: Bibasilar rales with coarse biapical rhonchi, tachypneic minimal accessory muscle use Heart: Tachycardic, irregularly irregular. Abdomen:  Soft, nontender, nondistended.  Without guarding or rebound. Extremities: 3+ pitting edema bilateral lower extremities to  the knee  Data Reviewed: I have personally reviewed following labs and imaging studies  CBC: Recent Labs  Lab 01/04/23 1250 01/04/23 1324 01/04/23 1341 01/04/23 1627 01/05/23 0113  WBC 6.8  --   --   --  8.0  NEUTROABS 4.8  --   --   --   --   HGB 12.7 13.9 13.9 13.6 12.5  HCT 43.8 41.0 41.0 40.0 42.1  MCV 101.4*  --   --   --  100.5*  PLT 286  --   --   --  258   Basic Metabolic Panel: Recent Labs  Lab 01/04/23 1250 01/04/23 1324 01/04/23 1341 01/04/23 1627 01/05/23 0113 01/05/23 1429  NA 141 137 140 140 140 142  K 5.1 5.9* 4.0 4.0 3.8 3.5  CL 93*  --   --   --  93* 90*  CO2 33*  --   --   --  35* 38*  GLUCOSE 152*  --   --   --  155* 187*  BUN 23  --   --   --  20 18  CREATININE 0.81  --   --   --  0.81 0.88  CALCIUM 10.3  --   --   --  10.0 9.6   GFR: Estimated Creatinine Clearance: 51.2 mL/min (by C-G formula based on SCr of 0.88 mg/dL). Liver Function Tests: Recent Labs  Lab 01/04/23 1250  AST 47*  ALT 29  ALKPHOS 126  BILITOT 1.2  PROT 6.6  ALBUMIN 3.1*   Coagulation Profile: Recent Labs  Lab 01/04/23 1250  INR 1.9*   Sepsis Labs: Recent Labs  Lab 01/04/23 1314 01/04/23 1425 01/04/23 1556 01/04/23 1717 01/05/23 0113  PROCALCITON  --   --   --  <0.10 <0.10  LATICACIDVEN 2.0* 3.1* 2.1*  --   --     Recent Results (from the past 240 hour(s))  Culture, blood (Routine x 2)     Status: None (Preliminary result)   Collection Time: 01/04/23 12:50 PM   Specimen: BLOOD  Result Value Ref Range Status   Specimen Description BLOOD SITE NOT SPECIFIED  Final   Special Requests   Final    BOTTLES DRAWN AEROBIC AND ANAEROBIC Blood Culture results may not be optimal due to an inadequate volume of blood received in culture bottles   Culture   Final    NO GROWTH 2 DAYS Performed at [facility], [address] [place], [place] [address]    Report Status PENDING  Incomplete  Urine Culture     Status: Abnormal   Collection Time: 01/04/23 12:50  PM   Specimen: Urine, Random  Result Value Ref Range Status   Specimen Description URINE, RANDOM  Final   Special Requests   Final    NONE Reflexed from [id number] Performed at [facility], [address], [place], [place] [address]    Culture MULTIPLE SPECIES PRESENT, SUGGEST RECOLLECTION (A)  Final   Report Status 01/06/2023 FINAL  Final  Culture, blood (Routine x 2)     Status: None (  Preliminary result)   Collection Time: 01/04/23 12:55 PM   Specimen: BLOOD  Result Value Ref Range Status   Specimen Description BLOOD SITE NOT SPECIFIED  Final   Special Requests   Final    BOTTLES DRAWN AEROBIC AND ANAEROBIC Blood Culture results may not be optimal due to an inadequate volume of blood received in culture bottles   Culture  Setup Time   Final    GRAM POSITIVE RODS ANAEROBIC BOTTLE ONLY CRITICAL RESULT CALLED TO, READ BACK BY AND VERIFIED WITH: PHARMD J. LEDFORD 01/05/23 @ 2133 BY AB    Culture   Final    GRAM POSITIVE RODS CULTURE REINCUBATED FOR BETTER GROWTH Performed at [facility], [address] [place], [place] [address]    Report Status PENDING  Incomplete  SARS Coronavirus 2 by RT PCR (hospital order, performed in [facility] hospital lab) *cepheid single result test* Anterior Nasal Swab     Status: None   Collection Time: 01/04/23  7:12 PM   Specimen: Anterior Nasal Swab  Result Value Ref Range Status   SARS Coronavirus 2 by RT PCR NEGATIVE NEGATIVE Final    Comment: Performed at [facility], [address] [place], [place] [address]  MRSA Next Gen by PCR, Nasal     Status: None   Collection Time: 01/05/23  1:40 AM   Specimen: Nasal Mucosa; Nasal Swab  Result Value Ref Range Status   MRSA by PCR Next Gen NOT DETECTED NOT DETECTED Final    Comment: (NOTE) The GeneXpert MRSA Assay (FDA approved for NASAL specimens only), is one component of a comprehensive MRSA colonization surveillance program. It is not intended to diagnose MRSA infection nor to  guide or monitor treatment for MRSA infections. Test performance is not FDA approved in patients less than 31 years old. Performed at [facility], [address] [place], [place] [address]          Radiology Studies: ECHOCARDIOGRAM COMPLETE  Result Date: 01/05/2023    ECHOCARDIOGRAM REPORT   Patient Name:   [name] Date of Exam: 01/05/2023 Medical Rec #:  [id number]      Height:       62.0 in Accession #:    [id number]     Weight:       194.7 lb Date of Birth:  03-22-1941      BSA:          1.890 m Patient Age:    82 years       BP:           102/63 mmHg Patient Gender: F              HR:           102 bpm. Exam Location:  Inpatient Procedure: 2D Echo, Cardiac Doppler and Color Doppler Indications:    acute systolic chf  History:        Patient has prior history of Echocardiogram examinations, most                 recent 07/26/2013. History of Takotsubo, Arrythmias:Atrial                 Fibrillation; Signs/Symptoms:Shortness of Breath.  Sonographer:    Delcie Roch RDCS Referring Phys: [id number] CHING T TU IMPRESSIONS  1. Left ventricular ejection fraction, by estimation, is 55% with beat to beat variability. The left ventricle has normal function. The left ventricle has no regional wall motion  abnormalities. There is mild left ventricular hypertrophy. Left ventricular diastolic parameters are indeterminate.  2. Right ventricular systolic function is mildly reduced. The right ventricular size is moderately enlarged. There is moderately elevated pulmonary artery systolic pressure. The estimated right ventricular systolic pressure is 50.5 mmHg.  3. Left atrial size was severely dilated.  4. Right atrial size was severely dilated.  5. The mitral valve is degenerative. Severe mitral valve regurgitation. No evidence of mitral stenosis. Severe mitral annular calcification.  6. 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  7. The aortic valve is tricuspid. There is mild calcification of the  aortic valve. Aortic valve regurgitation is mild. No aortic stenosis is present.  8. The inferior vena cava is dilated in size with <50% respiratory variability, suggesting right atrial pressure of 15 mmHg. FINDINGS  Left Ventricle: Left ventricular ejection fraction, by estimation, is 55%. The left ventricle has normal function. The left ventricle has no regional wall motion abnormalities. The left ventricular internal cavity size was normal in size. There is mild left ventricular hypertrophy. Left ventricular diastolic parameters are indeterminate. Right Ventricle: The right ventricular size is moderately enlarged. No increase in right ventricular wall thickness. Right ventricular systolic function is mildly reduced. There is moderately elevated pulmonary artery systolic pressure. The tricuspid regurgitant velocity is 2.98 m/s, and with an assumed right atrial pressure of 15 mmHg, the estimated right ventricular systolic pressure is 50.5 mmHg. Left [facility]: Left atrial size was severely dilated. Right [facility]: Right atrial size was severely dilated. Pericardium: There is no evidence of pericardial effusion. Mitral Valve: The mitral valve is degenerative in appearance. Severe mitral annular calcification. Severe mitral valve regurgitation. No evidence of mitral valve stenosis. The mean mitral valve gradient is 3.0 mmHg with average heart rate of 101 bpm. Tricuspid Valve: The tricuspid valve is normal in structure. 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 No evidence of tricuspid stenosis. Aortic Valve: The aortic valve is tricuspid. There is mild calcification of the aortic valve. Aortic valve regurgitation is mild. Aortic regurgitation PHT measures 451 msec. No aortic stenosis is present. Pulmonic Valve: The pulmonic valve was normal in structure. Pulmonic valve regurgitation is trivial. No evidence of pulmonic stenosis. Aorta: The aortic root is normal in size and structure. Venous: The inferior vena cava is  dilated in size with less than 50% respiratory variability, suggesting right atrial pressure of 15 mmHg. The inferior vena cava and the hepatic vein show a pattern of systolic flow reversal, suggestive of tricuspid regurgitation. IAS/Shunts: No atrial level shunt detected by color flow Doppler. Additional Comments: There is pleural effusion in the right lateral region.  LEFT VENTRICLE PLAX 2D LVIDd:         4.80 cm LVIDs:         3.80 cm LV PW:         1.10 cm LV IVS:        1.20 cm LVOT diam:     2.10 cm LVOT Area:     3.46 cm  RIGHT VENTRICLE             IVC RV Basal diam:  3.60 cm     IVC diam: 2.60 cm RV S prime:     10.90 cm/s TAPSE (M-mode): 1.1 cm LEFT ATRIUM              Index        RIGHT ATRIUM           Index LA diam:  5.10 cm  2.70 cm/m   RA Area:     29.70 cm LA Vol (A2C):   126.0 ml 66.67 ml/m  RA Volume:   104.00 ml 55.03 ml/m LA Vol (A4C):   94.0 ml  49.74 ml/m LA Biplane Vol: 109.0 ml 57.68 ml/m  AORTIC VALVE AI PHT:      451 msec  AORTA Ao Root diam: 2.80 cm Ao Asc diam:  3.60 cm MITRAL VALVE           TRICUSPID VALVE MV Mean grad: 3.0 mmHg TR Peak grad:   35.5 mmHg                        TR Vmax:        298.00 cm/s                         SHUNTS                        Systemic Diam: 2.10 cm Weston Brass MD Electronically signed by Weston Brass MD Signature Date/Time: 01/05/2023/8:37:43 PM    Final         Scheduled Meds:  allopurinol  100 mg Oral Daily   apixaban  5 mg Oral BID   aspirin  81 mg Oral Daily   buPROPion  300 mg Oral Daily   calcium carbonate  1 tablet Oral BID WC   cholecalciferol  1,000 Units Oral Daily   cyanocobalamin  1,000 mcg Oral Daily   digoxin  0.125 mg Oral Daily   doxycycline  100 mg Oral Q12H   furosemide  60 mg Intravenous Q6H   gabapentin  900 mg Oral Daily   metoprolol tartrate  25 mg Oral BID   pantoprazole  40 mg Oral Daily   rosuvastatin  5 mg Oral Daily   senna-docusate  1 tablet Oral BID   traMADol  50 mg Oral BID    Continuous Infusions:  cefTRIAXone (ROCEPHIN)  IV 2 g (01/06/23 1532)   diltiazem (CARDIZEM) infusion 10 mg/hr (01/07/23 0044)     LOS: 3 days   Time spent:  Azucena Fallen, DO [facility]  If 7PM-7AM, please contact night-coverage [email or web address]  01/07/2023, 7:27 AM

## 2023-01-07 NOTE — Progress Notes (Signed)
   01/07/23 0813  BiPAP/CPAP/SIPAP  $ Non-Invasive Ventilator  Non-Invasive Vent Subsequent  BiPAP/CPAP/SIPAP Pt Type Adult  BiPAP/CPAP/SIPAP SERVO  Mask Type Full face mask  Mask Size Medium  Set Rate 15 breaths/min  Respiratory Rate 24 breaths/min  IPAP 12 cmH20  PEEP 5 cmH20  FiO2 (%) 50 %  Minute Ventilation 13.1  Leak 44  Peak Inspiratory Pressure (PIP) 16  Patient Home Equipment No  Auto Titrate No  BiPAP/CPAP /SiPAP Vitals  Resp (!) 24  MEWS Score/Color  MEWS Score 1  MEWS Score Color [name]

## 2023-01-07 NOTE — Plan of Care (Signed)
  Problem: Education: Goal: Knowledge of General Education information will improve Description Including pain rating scale, medication(s)/side effects and non-pharmacologic comfort measures Outcome: Progressing   Problem: Health Behavior/Discharge Planning: Goal: Ability to manage health-related needs will improve Outcome: Progressing   Problem: Clinical Measurements: Goal: Ability to maintain clinical measurements within normal limits will improve Outcome: Progressing Goal: Diagnostic test results will improve Outcome: Progressing   Problem: Nutrition: Goal: Adequate nutrition will be maintained Outcome: Progressing   Problem: Coping: Goal: Level of anxiety will decrease Outcome: Progressing   Problem: Safety: Goal: Ability to remain free from injury will improve Outcome: Progressing   Problem: Skin Integrity: Goal: Risk for impaired skin integrity will decrease Outcome: Progressing

## 2023-01-07 NOTE — Plan of Care (Signed)

## 2023-01-07 NOTE — Progress Notes (Signed)
Heart Failure Navigator Progress Note  Assessed for Heart & Vascular TOC clinic readiness.  Patient EF 55%. Patient reported that she goes to [facility] and see's Dr. Rolanda Jay every 6 months and would like to stay with them. .   Navigator will sign off at this time.   Rhae Hammock, BSN, Scientist, clinical (histocompatibility and immunogenetics) Only

## 2023-01-08 ENCOUNTER — Inpatient Hospital Stay (HOSPITAL_COMMUNITY): Payer: Medicare Other

## 2023-01-08 DIAGNOSIS — I4891 Unspecified atrial fibrillation: Secondary | ICD-10-CM | POA: Diagnosis not present

## 2023-01-08 DIAGNOSIS — J9621 Acute and chronic respiratory failure with hypoxia: Secondary | ICD-10-CM | POA: Diagnosis not present

## 2023-01-08 DIAGNOSIS — J90 Pleural effusion, not elsewhere classified: Secondary | ICD-10-CM | POA: Diagnosis not present

## 2023-01-08 DIAGNOSIS — I2729 Other secondary pulmonary hypertension: Secondary | ICD-10-CM

## 2023-01-08 DIAGNOSIS — J9622 Acute and chronic respiratory failure with hypercapnia: Secondary | ICD-10-CM | POA: Diagnosis not present

## 2023-01-08 LAB — CULTURE, BLOOD (ROUTINE X 2)

## 2023-01-08 LAB — TSH: TSH: 1.141 u[IU]/mL (ref 0.350–4.500)

## 2023-01-08 LAB — HEMOGLOBIN A1C
Hgb A1c MFr Bld: 6.5 % — ABNORMAL HIGH (ref 4.8–5.6)
Mean Plasma Glucose: 139.85 mg/dL

## 2023-01-08 MED ORDER — AMIODARONE LOAD VIA INFUSION
150.0000 mg | Freq: Once | INTRAVENOUS | Status: AC
  Administered 2023-01-08: 150 mg via INTRAVENOUS
  Filled 2023-01-08: qty 83.34

## 2023-01-08 MED ORDER — GUAIFENESIN ER 600 MG PO TB12
600.0000 mg | ORAL_TABLET | Freq: Two times a day (BID) | ORAL | Status: DC
  Administered 2023-01-08 – 2023-01-09 (×4): 600 mg via ORAL
  Filled 2023-01-08 (×6): qty 1

## 2023-01-08 MED ORDER — FUROSEMIDE 10 MG/ML IJ SOLN: 40.0000 mg | Freq: Once | INTRAMUSCULAR | Status: DC

## 2023-01-08 MED ORDER — MIDODRINE HCL 5 MG PO TABS
10.0000 mg | ORAL_TABLET | Freq: Two times a day (BID) | ORAL | Status: DC
  Administered 2023-01-08 – 2023-01-09 (×3): 10 mg via ORAL
  Filled 2023-01-08 (×3): qty 2

## 2023-01-08 MED ORDER — ACETYLCYSTEINE 20 % IN SOLN
4.0000 mL | Freq: Two times a day (BID) | RESPIRATORY_TRACT | Status: DC
  Filled 2023-01-08 (×2): qty 4

## 2023-01-08 MED ORDER — IPRATROPIUM-ALBUTEROL 0.5-2.5 (3) MG/3ML IN SOLN: 3.0000 mL | Freq: Four times a day (QID) | RESPIRATORY_TRACT | Status: DC

## 2023-01-08 MED ORDER — ACETYLCYSTEINE 20 % IN SOLN
4.0000 mL | Freq: Two times a day (BID) | RESPIRATORY_TRACT | Status: AC
  Administered 2023-01-08 – 2023-01-13 (×10): 4 mL via RESPIRATORY_TRACT
  Filled 2023-01-08 (×12): qty 4

## 2023-01-08 MED ORDER — AMIODARONE HCL IN DEXTROSE 360-4.14 MG/200ML-% IV SOLN
60.0000 mg/h | INTRAVENOUS | Status: AC
  Administered 2023-01-08 (×2): 60 mg/h via INTRAVENOUS
  Filled 2023-01-08: qty 200

## 2023-01-08 MED ORDER — FUROSEMIDE 10 MG/ML IJ SOLN
80.0000 mg | Freq: Two times a day (BID) | INTRAMUSCULAR | Status: DC
  Administered 2023-01-08 – 2023-01-10 (×5): 80 mg via INTRAVENOUS
  Filled 2023-01-08 (×5): qty 8

## 2023-01-08 MED ORDER — GABAPENTIN 300 MG PO CAPS
400.0000 mg | ORAL_CAPSULE | Freq: Every day | ORAL | Status: DC
  Administered 2023-01-09: 400 mg via ORAL
  Filled 2023-01-08: qty 1

## 2023-01-08 MED ORDER — AMIODARONE HCL IN DEXTROSE 360-4.14 MG/200ML-% IV SOLN
30.0000 mg/h | INTRAVENOUS | Status: DC
  Administered 2023-01-09 – 2023-01-11 (×7): 30 mg/h via INTRAVENOUS
  Filled 2023-01-08 (×8): qty 200

## 2023-01-08 NOTE — Progress Notes (Signed)
Daily dressing changed on left lower leg per wound care orders.

## 2023-01-08 NOTE — Progress Notes (Addendum)
PROGRESS NOTE    [name]  WUJ:[id number] DOB: [birth year] DOA: 01/04/2023 PCP: Herma Carson, MD  82 yo F with PMH of DVT/PE, CHF (Takotsubo cardiomyopathy), afib, and stroke presented to the ED on 7/27 with weakness, shortness of breath, LE edema, AMS, and slurred speech. Tried increasing bumex as outpatient but did not see significant change to edema. CXR with moderate R pleural effusion, suspected HF and PNA.  -Significant hypoxia on admission, poor response to diuretics, on antibiotics for pneumonia  -ECHO 7/28 showed LVEF 55%, no RWMA, mild LVH, RV mildly reduced, moderately elevated PA pressure, severe MR, severe TR.  -Poor response to diuretics, remains considerably hypoxic  Subjective: -Feels a little better, on 12 L of O2 this morning  Assessment and Plan:  Acute on chronic respiratory failure with hypoxia and hypercapnia (HCC) Acute diastolic CHF, right pleural effusion ?Community acquired pneumonia -Treated by my partner with antibiotics and diuretics X 3 days -Poor response to diuretics, repeat x-ray today with whiteout of right lung -Will request pulmonary eval for thoracentesis today -Missed Lasix dose with low blood pressures overnight, will repeat today -Has been on ceftriaxone and doxycycline as well, low clinical suspicion for infection, afebrile without fever or leukocytosis, PCT less than 0.1, continue ceftriaxone till thoracentesis  Acute on chronic diastolic CHF Severe mitral regurgitation -Prior history of Takotsubo cardiomyopathy -Echo this admit with EF 55%, mildly reduced RV, severe MR, moderately elevated PA pressures -Poor response to diuretics thus far -Add midodrine -Add Jardiance if blood pressure tolerates -Will request cards input given severe mitral regurgitation  Paroxysmal atrial fibrillation with RVR (HCC) -Heart rate more stable, discontinue p.o. diltiazem, continue metoprolol, digoxin -continue Eliquis   Left leg wound -left  non-healing LE wound with purulent drainage and mild cellulitis. She was treated with a course of doxycycline about 3 weeks ago -Continue local wound care, likely worsened by edema from CHF  Hyperglycemia -Check HbA1c  History of DVT/PE -Continue Eliquis  History of CVA -Continue Eliquis  DVT prophylaxis: eliquis Code Status: Full Code Family Communication: No family at bedside Disposition Plan: May need rehab  Consultants:    Procedures:   Antimicrobials:    Objective: Vitals:   01/08/23 0844 01/08/23 0946 01/08/23 1012 01/08/23 1027  BP: 99/69   112/89  Pulse: (!) 140 100 97 (!) 102  Resp: (!) 22 (!) 22    Temp: (!) 97 F (36.1 C)     TempSrc: Oral     SpO2: 97% 97%    Weight:      Height:        Intake/Output Summary (Last 24 hours) at 01/08/2023 1048 Last data filed at 01/08/2023 0845 Gross per 24 hour  Intake 1057.33 ml  Output 850 ml  Net 207.33 ml   Filed Weights   01/06/23 0350 01/07/23 0307 01/08/23 0500  Weight: 88.5 kg 89.3 kg 89 kg    Examination:  General exam: Appears calm and comfortable, AAOx3, HEENT: Positive JVD CVS: S1-S2, regular rhythm, systolic murmur Lungs: Decreased breath sounds at the bases Abdomen: Soft, obese, nontender, bowel sounds present Extremities: 1-2+ edema, left leg with dressing Psychiatry:  Mood & affect appropriate.     Data Reviewed:   CBC: Recent Labs  Lab 01/04/23 1250 01/04/23 1324 01/04/23 1341 01/04/23 1627 01/05/23 0113  WBC 6.8  --   --   --  8.0  NEUTROABS 4.8  --   --   --   --   HGB 12.7 13.9 13.9 13.6 12.5  HCT 43.8 41.0 41.0 40.0 42.1  MCV 101.4*  --   --   --  100.5*  PLT 286  --   --   --  258   Basic Metabolic Panel: Recent Labs  Lab 01/04/23 1250 01/04/23 1324 01/04/23 1627 01/05/23 0113 01/05/23 1429 01/07/23 1300 01/08/23 0324  NA 141   < > 140 140 142 137 136  K 5.1   < > 4.0 3.8 3.5 3.4* 3.8  CL 93*  --   --  93* 90* 88* 85*  CO2 33*  --   --  35* 38* 36* 37*   GLUCOSE 152*  --   --  155* 187* 187* 100*  BUN 23  --   --  20 18 19 20   CREATININE 0.81  --   --  0.81 0.88 0.88 0.87  CALCIUM 10.3  --   --  10.0 9.6 8.9 9.1   < > = values in this interval not displayed.   GFR: Estimated Creatinine Clearance: 51.7 mL/min (by C-G formula based on SCr of 0.87 mg/dL). Liver Function Tests: Recent Labs  Lab 01/04/23 1250  AST 47*  ALT 29  ALKPHOS 126  BILITOT 1.2  PROT 6.6  ALBUMIN 3.1*   No results for input(s): "LIPASE", "AMYLASE" in the last 168 hours. No results for input(s): "AMMONIA" in the last 168 hours. Coagulation Profile: Recent Labs  Lab 01/04/23 1250  INR 1.9*   Cardiac Enzymes: No results for input(s): "CKTOTAL", "CKMB", "CKMBINDEX", "TROPONINI" in the last 168 hours. BNP (last 3 results) No results for input(s): "PROBNP" in the last 8760 hours. HbA1C: No results for input(s): "HGBA1C" in the last 72 hours. CBG: No results for input(s): "GLUCAP" in the last 168 hours. Lipid Profile: No results for input(s): "CHOL", "HDL", "LDLCALC", "TRIG", "CHOLHDL", "LDLDIRECT" in the last 72 hours. Thyroid Function Tests: No results for input(s): "TSH", "T4TOTAL", "FREET4", "T3FREE", "THYROIDAB" in the last 72 hours. Anemia Panel: No results for input(s): "VITAMINB12", "FOLATE", "FERRITIN", "TIBC", "IRON", "RETICCTPCT" in the last 72 hours. Urine analysis:    Component Value Date/Time   COLORURINE AMBER (A) 01/04/2023 1250   APPEARANCEUR CLOUDY (A) 01/04/2023 1250   LABSPEC 1.023 01/04/2023 1250   PHURINE 7.0 01/04/2023 1250   GLUCOSEU NEGATIVE 01/04/2023 1250   HGBUR NEGATIVE 01/04/2023 1250   BILIRUBINUR NEGATIVE 01/04/2023 1250   KETONESUR NEGATIVE 01/04/2023 1250   PROTEINUR 100 (A) 01/04/2023 1250   UROBILINOGEN 1.0 10/02/2009 1418   NITRITE POSITIVE (A) 01/04/2023 1250   LEUKOCYTESUR LARGE (A) 01/04/2023 1250   Sepsis Labs: @LABRCNTIP (procalcitonin:4,lacticidven:4)  ) Recent Results (from the past 240 hour(s))   Culture, blood (Routine x 2)     Status: None (Preliminary result)   Collection Time: 01/04/23 12:50 PM   Specimen: BLOOD  Result Value Ref Range Status   Specimen Description BLOOD SITE NOT SPECIFIED  Final   Special Requests   Final    BOTTLES DRAWN AEROBIC AND ANAEROBIC Blood Culture results may not be optimal due to an inadequate volume of blood received in culture bottles   Culture   Final    NO GROWTH 4 DAYS Performed at [facility], [address], [place], [place] [address]    Report Status PENDING  Incomplete  Urine Culture     Status: Abnormal   Collection Time: 01/04/23 12:50 PM   Specimen: Urine, Random  Result Value Ref Range Status   Specimen Description URINE, RANDOM  Final   Special Requests  Final    NONE Reflexed from [id number] Performed at [facility], [address], [place], [place] [address]    Culture MULTIPLE SPECIES PRESENT, SUGGEST RECOLLECTION (A)  Final   Report Status 01/06/2023 FINAL  Final  Culture, blood (Routine x 2)     Status: Abnormal   Collection Time: 01/04/23 12:55 PM   Specimen: BLOOD  Result Value Ref Range Status   Specimen Description BLOOD SITE NOT SPECIFIED  Final   Special Requests   Final    BOTTLES DRAWN AEROBIC AND ANAEROBIC Blood Culture results may not be optimal due to an inadequate volume of blood received in culture bottles   Culture  Setup Time   Final    GRAM POSITIVE RODS ANAEROBIC BOTTLE ONLY CRITICAL RESULT CALLED TO, READ BACK BY AND VERIFIED WITH: PHARMD J. LEDFORD 01/05/23 @ 2133 BY AB    Culture (A)  Final    CORYNEBACTERIUM UREALYTICUM Standardized susceptibility testing for this organism is not available. Performed at [facility], [address] [place], [place] [address]    Report Status 01/08/2023 FINAL  Final  SARS Coronavirus 2 by RT PCR (hospital order, performed in [facility] hospital lab) *cepheid single result test* Anterior Nasal Swab     Status: None   Collection Time: 01/04/23   7:12 PM   Specimen: Anterior Nasal Swab  Result Value Ref Range Status   SARS Coronavirus 2 by RT PCR NEGATIVE NEGATIVE Final    Comment: Performed at [facility], [address], [place], [place] [address]  MRSA Next Gen by PCR, Nasal     Status: None   Collection Time: 01/05/23  1:40 AM   Specimen: Nasal Mucosa; Nasal Swab  Result Value Ref Range Status   MRSA by PCR Next Gen NOT DETECTED NOT DETECTED Final    Comment: (NOTE) The GeneXpert MRSA Assay (FDA approved for NASAL specimens only), is one component of a comprehensive MRSA colonization surveillance program. It is not intended to diagnose MRSA infection nor to guide or monitor treatment for MRSA infections. Test performance is not FDA approved in patients less than 68 years old. Performed at [facility], [address], [place], [place] [address]      Radiology Studies: DG CHEST PORT 1 VIEW  Result Date: 01/08/2023 CLINICAL DATA:  Hypoxia EXAM: PORTABLE CHEST - 1 VIEW COMPARISON:  01/04/2023 FINDINGS: Complete opacification of right hemithorax since prior exam, with mild rightward mediastinal shift as before. Coarse perihilar interstitial opacities in the left without new infiltrate. Heart size is occult to assess due to adjacent opacities. Aortic Atherosclerosis (ICD10-170.0). Visualized bones unremarkable. IMPRESSION: Complete opacification of right hemithorax, new since prior exam. Electronically Signed   By: Corlis Leak M.D.   On: 01/08/2023 09:35     Scheduled Meds:  allopurinol  100 mg Oral Daily   apixaban  5 mg Oral BID   aspirin  81 mg Oral Daily   buPROPion  300 mg Oral Daily   calcium carbonate  1 tablet Oral BID WC   cholecalciferol  1,000 Units Oral Daily   cyanocobalamin  1,000 mcg Oral Daily   digoxin  0.125 mg Oral Daily   doxycycline  100 mg Oral Q12H   furosemide  40 mg Intravenous Once   gabapentin  900 mg Oral Daily   metoprolol tartrate  25 mg Oral BID   midodrine  10 mg Oral BID WC    pantoprazole  40 mg Oral Daily  rosuvastatin  5 mg Oral Daily   senna-docusate  1 tablet Oral BID   traMADol  50 mg Oral BID   Continuous Infusions:  cefTRIAXone (ROCEPHIN)  IV 2 g (01/07/23 1540)     LOS: 4 days    Time spent:    Zannie Cove, MD [facility]   01/08/2023, 10:48 AM

## 2023-01-08 NOTE — Progress Notes (Addendum)
Pt feeling respiratory discomfort and requesting BIPAP. Will place pt on BIPAP and hold morning meds until pt is feeling more comfortable.  1030: Md wanted metoprolol given. Morning meds given with metoprolol. Pt switched to NRB because of oral intake. Will reassess and switch back to BIPAP when appropriate.

## 2023-01-08 NOTE — Consult Note (Addendum)
Cardiology Consultation   Patient ID: [name] MRN: [id number]; DOB: 08-10-1940  Admit date: 01/04/2023 Date of Consult: 01/08/2023  PCP:  Herma Carson, MD   [facility] Providers Cardiologist:  None [facility]   Patient Profile:   [name] is a 82 y.o. female with a hx of Takotsubo cardiomyopathy 2011, paroxysmal atrial fibrillation, CVA 1991, DVT/PE, chronic hypoxic respiratory failure on 2 L, hyperlipidemia, bilateral carotid artery stenosis, who is being seen 01/08/2023 for the evaluation of HFpEF exacerbation/newly diagnosed severe MR at the request of Dr. Jomarie Longs.  History of Present Illness:   [name] has history of Takotsubo cardiomyopathy in 2011 during which she had an episode of VT/V-fib arrest during cardiac catheterization that showed LAD wall motion abnormality however no significant obstructive disease.  She then followed up with Dr. Rennis Golden in 2014 and with normalization of her EF. Throughout the years her EF has been stable at around 55 to 60%.  She is now followed by [facility] and last seen in January 2024.  She has known history of PAF with last ZIO monitor in 2022 showing no evidence of atrial fibrillation and was being managed on Lopressor 25 mg twice daily and Eliquis.  She has known moderate to severe tricuspid regurgitation on echocardiogram in December 2023.    Patient was initially admitted on 01/04/2023 due to presumed CHF exacerbation, altered mental status and slurred speech.  Chart review suggest that she had failed outpatient diuresis despite increase in Bumex.  In the emergency room she was hypercapnic and has been placed on BiPAP intermittently.  There is suspicion of community-acquired pneumonia and currently on antibiotics per primary team.  She presented with clear signs of hypervolemia (pulmonary congestion on chest x-ray/lower extremity edema).  She has not responded well with IV diuretics and has plans for possible thoracentesis  pending pulmonology evaluation.  She has had soft blood pressures that have limited treatment options and primary team has started midodrine.  Additionally, patient presented with atrial fibrillation RVR however rates now better and on metoprolol and digoxin. Cardiology is now being consulted due to new onset of severe MR and further heart failure management.  Patient states that she has had ongoing and progressive symptoms of shortness of breath, severe orthopnea, altered mental status.  She states that her daughter noticed about 2 weeks ago that patient has had some alteration in mental status.  Patient states that for the last 2 weeks she has had significant orthopnea and previously went to sleep in a bed however now sleeps in a recliner.  She denies any chest pain, dizziness, palpitations.  At the bedside patient is significantly short of breath with minimal movement and currently on 15 L via nonrebreather.    Labs/imaging: EKG showing A-fib RVR, heart rate 140s.  Initial chest x-ray showed moderate right pleural effusion with adjacent lung opacities that have developed.  She has complete opacification of right hemithorax which is new.  BNP 563.  Echocardiogram with preserved EF 55%.  RVSP 50.  Severe TR/MR.  RV function mildly reduced.  Also being treated for resistant cellulitis after failing doxycycline 2 weeks ago.  Past Medical History:  Diagnosis Date   Cardiac murmur    Echo, EF-55-60, mild tricuspid regurgitation   CVA (cerebral vascular accident) (HCC) 1991   DVT (deep venous thrombosis) (HCC)    PE (pulmonary embolism)    Takotsubo syndrome 08/2009   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   EF - 55-60, Tricuspid valve -  mild-moderate regurgitation; 12/14/2009 - EF-55, mild to moderate tricuspid regurgitation    Past Surgical History:  Procedure Laterality Date   BIOPSY  11/02/2018   Procedure: BIOPSY;  Surgeon: Jeani Hawking, MD;  Location: [facility] ENDOSCOPY;  Service: Endoscopy;;    CARDIAC CATHETERIZATION  09/08/2009   EF-45, Free of disease   ESOPHAGOGASTRODUODENOSCOPY N/A 11/02/2018   Procedure: ESOPHAGOGASTRODUODENOSCOPY (EGD);  Surgeon: Jeani Hawking, MD;  Location: [facility] ENDOSCOPY;  Service: Endoscopy;  Laterality: N/A;   FOREIGN BODY REMOVAL  11/02/2018   Procedure: FOREIGN BODY REMOVAL;  Surgeon: Jeani Hawking, MD;  Location: [facility] ENDOSCOPY;  Service: Endoscopy;;     Inpatient Medications: Scheduled Meds:  acetylcysteine  4 mL Nebulization BID   allopurinol  100 mg Oral Daily   aspirin  81 mg Oral Daily   buPROPion  300 mg Oral Daily   calcium carbonate  1 tablet Oral BID WC   cholecalciferol  1,000 Units Oral Daily   cyanocobalamin  1,000 mcg Oral Daily   digoxin  0.125 mg Oral Daily   furosemide  80 mg Intravenous BID   [START ON 01/09/2023] gabapentin  400 mg Oral Daily   guaiFENesin  600 mg Oral BID   metoprolol tartrate  25 mg Oral BID   midodrine  10 mg Oral BID WC   pantoprazole  40 mg Oral Daily   rosuvastatin  5 mg Oral Daily   senna-docusate  1 tablet Oral BID   traMADol  50 mg Oral BID   Continuous Infusions:  cefTRIAXone (ROCEPHIN)  IV 2 g (01/07/23 1540)   PRN Meds: ipratropium-albuterol  Allergies:    Allergies  Allergen Reactions   Codeine    Morphine And Codeine Other (See Comments)    confusion    Social History:   Social History   Socioeconomic History   Marital status: Widowed    Spouse name: Not on file   Number of children: 3   Years of education: Not on file   Highest education level: Not on file  Occupational History   Not on file  Tobacco Use   Smoking status: Former    Current packs/day: 0.00    Average packs/day: 1.5 packs/day for 51.0 years (76.5 ttl pk-yrs)    Types: Cigarettes    Start date: 06/11/1955    Quit date: 06/10/2006    Years since quitting: 16.5   Smokeless tobacco: Never  Vaping Use   Vaping status: Never Used  Substance and Sexual Activity   Alcohol use: No   Drug use: No   Sexual activity: Not  on file  Other Topics Concern   Not on file  Social History Narrative   Not on file   Social Determinants of Health   Financial Resource Strain: Not on file  Food Insecurity: Low Risk  (07/15/2022)   Received from [facility] visits prior to 08/10/2022.   Food    Within the past 12 months, you worried that your food would run out before you got money to buy more food: Never true    Within the past 12 months, the food you bought just didn't last and you didn't have money to get more: Never true  Transportation Needs: No Transportation Needs (07/15/2022)   Received from [facility] visits prior to 08/10/2022.   Transportation    In the past 12 months, has lack of reliable transportation kept you from medical appointments, meetings, work or from getting things needed for daily living?: No  Physical Activity: Not on file  Stress: Not on file  Social Connections: Not on file  Intimate Partner Violence: Low Risk  (05/23/2022)   Received from [facility] visits prior to 08/10/2022.   Safety    How often does anyone, including family and friends, physically hurt you?: Never    How often does anyone, including family and friends, insult or talk down to you?: Never    How often does anyone, including family and friends, threaten you with harm?: Never    How often does anyone, including family and friends, scream or curse at you?: Never    Family History:   Family History  Problem Relation Age of Onset   Heart disease Mother    Heart disease Father    Heart disease Brother    Heart disease Sister    Heart Problems Brother    Diabetes Brother      ROS:  Please see the history of present illness.  All other ROS reviewed and negative.     Physical Exam/Data:   Vitals:   01/08/23 0844 01/08/23 0946 01/08/23 1012 01/08/23 1027  BP: 99/69   112/89  Pulse: (!) 140 100 97 (!) 102  Resp: (!) 22 (!) 22  (!) 21  Temp: (!) 97 F  (36.1 C)   97.6 F (36.4 C)  TempSrc: Oral   Oral  SpO2: 97% 97%  95%  Weight:      Height:        Intake/Output Summary (Last 24 hours) at 01/08/2023 1311 Last data filed at 01/08/2023 0845 Gross per 24 hour  Intake 937.33 ml  Output 650 ml  Net 287.33 ml      01/08/2023    5:00 AM 01/07/2023    3:07 AM 01/06/2023    3:50 AM  Last 3 Weights  Weight (lbs) 196 lb 3.4 oz 196 lb 13.9 oz 195 lb 1.7 oz  Weight (kg) 89 kg 89.3 kg 88.5 kg     Body mass index is 35.89 kg/m.  General: Increased work of breathing, on 15 L via nonrebreather  HEENT: normal Neck: + JVD Vascular: No carotid bruits; Distal pulses 2+ bilaterally Cardiac: Irregularly irregular Lungs: Distant lung sounds, diminished Abd: soft, nontender, no hepatomegaly  Ext: 3+ pitting edema  Musculoskeletal:  No deformities, BUE and BLE strength normal and equal Skin: warm and dry  Neuro:  CNs 2-12 intact, no focal abnormalities noted Psych:  Normal affect   EKG:  The EKG was personally reviewed and demonstrates:  Atrial fibrillation, heart rate 144. No acute ischemic STTW changes. Telemetry:  Telemetry was personally reviewed and demonstrates: Atrial fibrillation heart rate 90s  Relevant CV Studies: Echocardiogram 01/05/2023  1. Left ventricular ejection fraction, by estimation, is 55% with beat to  beat variability. The left ventricle has normal function. The left  ventricle has no regional wall motion abnormalities. There is mild left  ventricular hypertrophy. Left  ventricular diastolic parameters are indeterminate.   2. Right ventricular systolic function is mildly reduced. The right  ventricular size is moderately enlarged. There is moderately elevated  pulmonary artery systolic pressure. The estimated right ventricular  systolic pressure is 50.5 mmHg.   3. Left atrial size was severely dilated.   4. Right atrial size was severely dilated.   5. The mitral valve is degenerative. Severe mitral valve  regurgitation.  No evidence of mitral stenosis. Severe mitral annular calcification.   6. 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   7. The  aortic valve is tricuspid. There is mild calcification of the  aortic valve. Aortic valve regurgitation is mild. No aortic stenosis is  present.   8. The inferior vena cava is dilated in size with <50% respiratory  variability, suggesting right atrial pressure of 15 mmHg.   Laboratory Data:  High Sensitivity Troponin:   Recent Labs  Lab 01/04/23 1250 01/04/23 1717  TROPONINIHS 24* 24*     Chemistry Recent Labs  Lab 01/05/23 1429 01/07/23 1300 01/08/23 0324  NA 142 137 136  K 3.5 3.4* 3.8  CL 90* 88* 85*  CO2 38* 36* 37*  GLUCOSE 187* 187* 100*  BUN 18 19 20   CREATININE 0.88 0.88 0.87  CALCIUM 9.6 8.9 9.1  GFRNONAA >60 >60 >60  ANIONGAP 14 13 14     Recent Labs  Lab 01/04/23 1250  PROT 6.6  ALBUMIN 3.1*  AST 47*  ALT 29  ALKPHOS 126  BILITOT 1.2   Lipids No results for input(s): "CHOL", "TRIG", "HDL", "LABVLDL", "LDLCALC", "CHOLHDL" in the last 168 hours.  Hematology Recent Labs  Lab 01/04/23 1250 01/04/23 1324 01/04/23 1341 01/04/23 1627 01/05/23 0113  WBC 6.8  --   --   --  8.0  RBC 4.32  --   --   --  4.19  HGB 12.7   < > 13.9 13.6 12.5  HCT 43.8   < > 41.0 40.0 42.1  MCV 101.4*  --   --   --  100.5*  MCH 29.4  --   --   --  29.8  MCHC 29.0*  --   --   --  29.7*  RDW 14.2  --   --   --  14.3  PLT 286  --   --   --  258   < > = values in this interval not displayed.   Thyroid No results for input(s): "TSH", "FREET4" in the last 168 hours.  BNP Recent Labs  Lab 01/04/23 1250  BNP 563.3*    DDimer No results for input(s): "DDIMER" in the last 168 hours.   Radiology/Studies:  DG CHEST PORT 1 VIEW  Result Date: 01/08/2023 CLINICAL DATA:  Hypoxia EXAM: PORTABLE CHEST - 1 VIEW COMPARISON:  01/04/2023 FINDINGS: Complete opacification of right hemithorax since prior exam, with mild rightward mediastinal shift  as before. Coarse perihilar interstitial opacities in the left without new infiltrate. Heart size is occult to assess due to adjacent opacities. Aortic Atherosclerosis (ICD10-170.0). Visualized bones unremarkable. IMPRESSION: Complete opacification of right hemithorax, new since prior exam. Electronically Signed   By: Corlis Leak M.D.   On: 01/08/2023 09:35   ECHOCARDIOGRAM COMPLETE  Result Date: 01/05/2023    ECHOCARDIOGRAM REPORT   Patient Name:   BOSTYNN HITCHINGS Date of Exam: 01/05/2023 Medical Rec #:  [id number]      Height:       62.0 in Accession #:    [id number]     Weight:       194.7 lb Date of Birth:  1940/11/06      BSA:          1.890 m Patient Age:    82 years       BP:           102/63 mmHg Patient Gender: F              HR:           102 bpm. Exam Location:  Inpatient Procedure: 2D Echo, Cardiac Doppler and  Color Doppler Indications:    acute systolic chf  History:        Patient has prior history of Echocardiogram examinations, most                 recent 07/26/2013. History of Takotsubo, Arrythmias:Atrial                 Fibrillation; Signs/Symptoms:Shortness of Breath.  Sonographer:    Delcie Roch RDCS Referring Phys: [id number] CHING T TU IMPRESSIONS  1. Left ventricular ejection fraction, by estimation, is 55% with beat to beat variability. The left ventricle has normal function. The left ventricle has no regional wall motion abnormalities. There is mild left ventricular hypertrophy. Left ventricular diastolic parameters are indeterminate.  2. Right ventricular systolic function is mildly reduced. The right ventricular size is moderately enlarged. There is moderately elevated pulmonary artery systolic pressure. The estimated right ventricular systolic pressure is 50.5 mmHg.  3. Left atrial size was severely dilated.  4. Right atrial size was severely dilated.  5. The mitral valve is degenerative. Severe mitral valve regurgitation. No evidence of mitral stenosis. Severe mitral annular  calcification.  6. 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  7. The aortic valve is tricuspid. There is mild calcification of the aortic valve. Aortic valve regurgitation is mild. No aortic stenosis is present.  8. The inferior vena cava is dilated in size with <50% respiratory variability, suggesting right atrial pressure of 15 mmHg. FINDINGS  Left Ventricle: Left ventricular ejection fraction, by estimation, is 55%. The left ventricle has normal function. The left ventricle has no regional wall motion abnormalities. The left ventricular internal cavity size was normal in size. There is mild left ventricular hypertrophy. Left ventricular diastolic parameters are indeterminate. Right Ventricle: The right ventricular size is moderately enlarged. No increase in right ventricular wall thickness. Right ventricular systolic function is mildly reduced. There is moderately elevated pulmonary artery systolic pressure. The tricuspid regurgitant velocity is 2.98 m/s, and with an assumed right atrial pressure of 15 mmHg, the estimated right ventricular systolic pressure is 50.5 mmHg. Left [facility]: Left atrial size was severely dilated. Right [facility]: Right atrial size was severely dilated. Pericardium: There is no evidence of pericardial effusion. Mitral Valve: The mitral valve is degenerative in appearance. Severe mitral annular calcification. Severe mitral valve regurgitation. No evidence of mitral valve stenosis. The mean mitral valve gradient is 3.0 mmHg with average heart rate of 101 bpm. Tricuspid Valve: The tricuspid valve is normal in structure. 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 No evidence of tricuspid stenosis. Aortic Valve: The aortic valve is tricuspid. There is mild calcification of the aortic valve. Aortic valve regurgitation is mild. Aortic regurgitation PHT measures 451 msec. No aortic stenosis is present. Pulmonic Valve: The pulmonic valve was normal in structure. Pulmonic valve regurgitation is trivial.  No evidence of pulmonic stenosis. Aorta: The aortic root is normal in size and structure. Venous: The inferior vena cava is dilated in size with less than 50% respiratory variability, suggesting right atrial pressure of 15 mmHg. The inferior vena cava and the hepatic vein show a pattern of systolic flow reversal, suggestive of tricuspid regurgitation. IAS/Shunts: No atrial level shunt detected by color flow Doppler. Additional Comments: There is pleural effusion in the right lateral region.  LEFT VENTRICLE PLAX 2D LVIDd:         4.80 cm LVIDs:         3.80 cm LV PW:         1.10 cm LV IVS:  1.20 cm LVOT diam:     2.10 cm LVOT Area:     3.46 cm  RIGHT VENTRICLE             IVC RV Basal diam:  3.60 cm     IVC diam: 2.60 cm RV S prime:     10.90 cm/s TAPSE (M-mode): 1.1 cm LEFT ATRIUM              Index        RIGHT ATRIUM           Index LA diam:        5.10 cm  2.70 cm/m   RA Area:     29.70 cm LA Vol (A2C):   126.0 ml 66.67 ml/m  RA Volume:   104.00 ml 55.03 ml/m LA Vol (A4C):   94.0 ml  49.74 ml/m LA Biplane Vol: 109.0 ml 57.68 ml/m  AORTIC VALVE AI PHT:      451 msec  AORTA Ao Root diam: 2.80 cm Ao Asc diam:  3.60 cm MITRAL VALVE           TRICUSPID VALVE MV Mean grad: 3.0 mmHg TR Peak grad:   35.5 mmHg                        TR Vmax:        298.00 cm/s                         SHUNTS                        Systemic Diam: 2.10 cm Weston Brass MD Electronically signed by Weston Brass MD Signature Date/Time: 01/05/2023/8:37:43 PM    Final    DG Chest Port 1 View  Result Date: 01/04/2023 CLINICAL DATA:  Sepsis EXAM: PORTABLE CHEST 1 VIEW COMPARISON:  X-ray 09/20/22 FINDINGS: Developing moderate right effusion and adjacent lung opacity. Film is rotated to the right. Enlarged cardiopericardial silhouette with a tortuous aorta. Interstitial prominence again seen. Apical pleural thickening. Subtle patchy opacity left lung base as well. No pneumothorax. Overlapping cardiac leads. Osteopenia.  IMPRESSION: Developing moderate right effusion and adjacent lung opacities. Mild opacity left lung base. Recommend follow-up. Acute process is possible such as infiltrate Electronically Signed   By: Karen Kays M.D.   On: 01/04/2023 13:36     Assessment and Plan:   Acute on chronic HFpEF with moderate right pleural effusion Patient presenting with worsening signs of orthopnea/shortness of breath for the last 2 weeks.  Failed outpatient titration of Bumex.  She is overtly volume overloaded with 3+ pitting edema, JVD, severe orthopnea on BiPAP.  Echocardiogram shows preserved EF of 55% with mild reduction in RV function.  RVSP 50.  Question if she could be low output state given soft blood pressure now requiring midodrine 10 mg twice daily.  Will defer RHC for now after discussing with MD.  Start IV Lasix 80 mg twice daily.  Monitor blood pressure closely. Initiate SGLT2 inhibitor once more adequately diuresed, and once plans for catheterization have been addressed. Continue midodrine 10 mg twice daily, digoxin 0.125 mg Stopping lopressor 25mg  BID given acute decompensation.  Titrate GDMT after diuresis and better BP. Thoracentesis scheduled for tomorrow.  Paroxysmal atrial fibrillation Presumed to have been maintaining normal sinus rhythm since at least 2022 ZIO monitor showed no afib, no documented occurrences till now.  She has  better controlled rates in the 90s now.  Arrhythmia likely driven by heart failure exacerbation/possible pneumonia.  Continue to treat underlying causes.  If she remains to be in atrial fibrillation can plan for DCCV outpatient.  Resume Eliquis 5 mg twice daily after thoracentesis. Starting IV amio given hypotension and further rate control. Continue digoxin 0.125 mg. Order TSH  Acute on chronic respiratory failure with hypoxia and hypercapnia Likely multifactorial given heart failure exacerbation and pneumonia.  Suspect that she also has underlying COPD that needs  further evaluation.  Has significant respiratory demands.  On BiPAP.  Severe TR/MR Chronically has had moderate to severe TR.  TR velocity 2.98.  Mean mitral gradient 3.  Now has new onset of MR.  Suspects this may be attributed due to hypervolemia.  Will need to repeat echocardiogram to evaluate once sufficiently diuresed.  Will discuss with attending MD if further management is warranted.  Hyperlipidemia Continue rosuvastatin 5 mg  History of DVT/PE History of CVA Cellulitis CAP Per primary team     Risk Assessment/Risk Scores:  New York Heart Association (NYHA) Functional Class NYHA Class IV  CHA2DS2-VASc Score = 9  :1} This indicates a 12.2% annual risk of stroke. The patient's score is based upon: CHF History: 1 HTN History: 1 Diabetes History: 1 Stroke History: 2 Vascular Disease History: 1 Age Score: 2 Gender Score: 1     For questions or updates, please contact [facility] Please consult [email or web address] for contact info under    Signed, Abagail Kitchens, PA-C  01/08/2023 1:11 PM   Patient seen, examined. Available data reviewed. Agree with findings, assessment, and plan as outlined by Yvonna Alanis, PA.  The patient is independently interviewed and examined.  She is resting comfortably on BiPAP.  History is limited as she is on BiPAP and there is no family at bedside.  She is an elderly woman in no acute distress.  HEENT is normal, JVP is moderately elevated, lungs are coarse bilaterally, heart is regular rate and rhythm with 2/6 systolic murmur at the left lower sternal border, abdomen is soft and nontender, extremities have 2+ leg edema bilaterally.  The patient has symptoms of acute on chronic diastolic heart failure with respiratory failure in the context of a large right pleural effusion, possibly parapneumonic.  The patient is being treated appropriately with broad-spectrum antibiotics by the medicine team.  Will try to more aggressively diurese her to help  reduce interstitial edema by increasing furosemide to 80 mg IV twice daily.  I think if we can control her atrial fibrillation, her heart failure will be better compensated.  Will try her on IV amiodarone for rate and rhythm management.  She has been on apixaban consistently and this is only being held for 24 hours to do thoracentesis.  Will plan to start it back as soon as we can after the thoracentesis procedure.  Her mitral and tricuspid regurgitation are likely secondary, and at this time we will treat her medically.  Will follow with you.  Tonny Bollman, M.D. 01/08/2023 3:07 PM

## 2023-01-08 NOTE — Plan of Care (Signed)

## 2023-01-08 NOTE — Progress Notes (Signed)
   Heart Failure Stewardship Pharmacist Progress Note   PCP: Herma Carson, MD PCP-Cardiologist: None    HPI:  82 yo F with PMH of DVT/PE, CHF (Takotsubo cardiomyopathy), afib, and stroke.  Presented to the ED on 7/27 with weakness, shortness of breath, LE edema, AMS, and slurred speech. Tried increasing bumex as outpatient but did not see significant change to edema. CXR with moderate R pleural effusion, suspected HF and PNA. ECHO 7/28 showed LVEF 55%, no RWMA, mild LVH, RV mildly reduced, moderately elevated PA pressure, severe MR, severe TR. Has been on and off bipap. CXR 7/31 with complete opacification of R hemithorax, new since prior exam. Pulmonary consulted for possible thoracentesis.    Current HF Medications: Beta Blocker: metoprolol tartrate 25 mg BID Other: digoxin 0.125 mg daily *on midodrine 10 mg BID  Prior to admission HF Medications: Diuretic: bumex 2 mg daily Beta blocker: metoprolol tartrate 25 mg BID Other: digoxin 0.125 mg daily  Pertinent Lab Values: Serum creatinine 0.87, BUN 20, Potassium 3.8, Sodium 136, BNP 563.3, Digoxin <0.2 (7/27)  Vital Signs: Weight: 196 lbs (admission weight: 194 lbs) Blood pressure: 80-110/60s  Heart rate: 80-100s  I/O: net +0.2L yesterday; net +0.9L since admission  Medication Assistance / Insurance Benefits Check: Does the patient have prescription insurance?  Yes Type of insurance plan: UHC Medicare  Does the patient qualify for medication assistance through manufacturers or grants?   Pending Eligible grants and/or patient assistance programs: pending Medication assistance applications in progress: none  Medication assistance applications approved: none Approved medication assistance renewals will be completed by: pending  Outpatient Pharmacy:  Prior to admission outpatient pharmacy: [facility] Is the patient willing to use [facility] TOC pharmacy at discharge? Yes Is the patient willing to transition their outpatient pharmacy to  utilize a [facility] outpatient pharmacy? No    Assessment: 1. Acute on chronic diastolic CHF (LVEF 55-60%), due to NICM. NYHA class III-IV symptoms. - Off IV lasix, still with signs of volume overload. Has had sluggish output. Consider restarting IV lasix and adding metolazone. Strict I/Os and daily weights.  - Consider holding metoprolol if signs of low output. Currently warm on exam.  - Hold ACE/ARB/ARNI and MRA to allow for aggressive diuresis. Has been hypotensive requiring addition of midodrine.  - Consider adding SGLT2i prior to discharge pending mobility - Continue digoxin 0.125 mg daily   Plan: 1) Medication changes recommended at this time: - Restart IV lasix, add metolazone  2) Patient assistance: - Jardiance/Farxiga $47  3)  Education  - Patient has been educated on current HF medications and potential additions to HF medication regimen - Patient verbalizes understanding that over the next few months, these medication doses may change and more medications may be added to optimize HF regimen - Patient has been educated on basic disease state pathophysiology and goals of therapy   Sharen Hones, PharmD, BCPS Heart Failure Stewardship Pharmacist Phone [phone]

## 2023-01-08 NOTE — Consult Note (Addendum)
   NAME:  [name], MRN:  [id number], DOB:  [birth year], LOS: 4 ADMISSION DATE:  01/04/2023, CONSULTATION DATE: 01/08/2023 REFERRING MD: Dr. Jomarie Longs, CHIEF COMPLAINT: Right pleural effusion  History of Present Illness:  Asked to see for possible thoracentesis  Patient with history of congestive heart failure, failing diuretics with chest x-ray showing whiteout of the right lung  She does have a mild cough, not really bringing up any secretions She did increase her Bumex at home but did not notice significant increase in diuresis She is requiring BiPAP on and off  Pertinent  Medical History   Past Medical History:  Diagnosis Date   Cardiac murmur    Echo, EF-55-60, mild tricuspid regurgitation   CVA (cerebral vascular accident) (HCC) 1991   DVT (deep venous thrombosis) (HCC)    PE (pulmonary embolism)    Takotsubo syndrome 08/2009   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 rate (!) 21, height 5\' 2"  (1.575 m), weight 89 kg, SpO2 95%.    FiO2 (%):  [50 %] 50 % PEEP:  [5 cmH20] 5 cmH20   Intake/Output Summary (Last 24 hours) at 01/08/2023 1157 Last data filed at 01/08/2023 0845 Gross per 24 hour  Intake 937.33 ml  Output 650 ml  Net 287.33 ml   Filed Weights   01/06/23 0350 01/07/23 0307 01/08/23 0500  Weight: 88.5 kg 89.3 kg 89 kg    Examination: General: Chronically ill-appearing HENT: Moist oral mucosa Lungs: Decreased air movement on the  right Cardiovascular: S1-S2 appreciated Abdomen: Soft, bowel sounds appreciated Extremities: Mild edema Neuro: Awake alert interactive GU:     Resolved Hospital Problem list     Assessment & Plan:  Acute on chronic respiratory failure with hypoxia, hypercapnia Diastolic heart failure Right pleural effusion -Ultrasound does show some fluid and associated atelectasis Consolidated lung may be related to pneumonia -Will complete course of antibiotics -Will continue diuretics  Will plan for thoracentesis, hold Eliquis today -Plan for thoracentesis 01/09/2023  She likely has a component of significant atelectasis as well -Add Mucomyst and Mucinex -Chest PT  Acute on chronic diastolic congestive heart failure -Continue cautious diuresis  She does have moderately elevated pulmonary pressures 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 in the context of being on Eliquis  History of DVT/PE  If lack of reexpansion of lung following chest PT, thoracentesis, may require bronchoscopy-this was discussed with the patient  Virl Diamond, MD [facility] Pager: See Loretha Stapler

## 2023-01-08 NOTE — Progress Notes (Signed)
   01/08/23 0000  BiPAP/CPAP/SIPAP  Reason BIPAP/CPAP not in use Other(comment) (As needed)

## 2023-01-08 NOTE — Progress Notes (Signed)
Patient IV Lasix of 60 mg Q6H held 2x overnight due to patients blood pressure 87/56 and 94/57.

## 2023-01-09 ENCOUNTER — Encounter (HOSPITAL_COMMUNITY): Payer: Self-pay | Admitting: Family Medicine

## 2023-01-09 ENCOUNTER — Inpatient Hospital Stay (HOSPITAL_COMMUNITY): Payer: Medicare Other

## 2023-01-09 ENCOUNTER — Encounter (HOSPITAL_COMMUNITY)
Admission: EM | Disposition: A | Discharge status: Skilled Nursing Facility | Payer: Self-pay | Source: Home / Self Care | Attending: Internal Medicine

## 2023-01-09 DIAGNOSIS — J9621 Acute and chronic respiratory failure with hypoxia: Secondary | ICD-10-CM | POA: Diagnosis not present

## 2023-01-09 DIAGNOSIS — J90 Pleural effusion, not elsewhere classified: Secondary | ICD-10-CM

## 2023-01-09 DIAGNOSIS — J9622 Acute and chronic respiratory failure with hypercapnia: Secondary | ICD-10-CM | POA: Diagnosis not present

## 2023-01-09 HISTORY — PX: THORACENTESIS: SHX235

## 2023-01-09 LAB — LACTATE DEHYDROGENASE, PLEURAL OR PERITONEAL FLUID: LD, Fluid: 173 U/L — ABNORMAL HIGH (ref 3–23)

## 2023-01-09 LAB — CULTURE, BLOOD (ROUTINE X 2): Culture: NO GROWTH

## 2023-01-09 LAB — BODY FLUID CULTURE W GRAM STAIN

## 2023-01-09 LAB — BODY FLUID CELL COUNT WITH DIFFERENTIAL
Eos, Fluid: 0 %
Lymphs, Fluid: 37 %
Monocyte-Macrophage-Serous Fluid: 39 % — ABNORMAL LOW (ref 50–90)
Neutrophil Count, Fluid: 24 % (ref 0–25)
Total Nucleated Cell Count, Fluid: 855 cu mm (ref 0–1000)

## 2023-01-09 LAB — GLUCOSE, PLEURAL OR PERITONEAL FLUID: Glucose, Fluid: 135 mg/dL

## 2023-01-09 LAB — PROTEIN, PLEURAL OR PERITONEAL FLUID: Total protein, fluid: <3 g/dL

## 2023-01-09 SURGERY — THORACENTESIS
Anesthesia: LOCAL | Laterality: Right

## 2023-01-09 MED ORDER — POTASSIUM CHLORIDE CRYS ER 20 MEQ PO TBCR
40.0000 meq | EXTENDED_RELEASE_TABLET | Freq: Two times a day (BID) | ORAL | Status: AC
  Administered 2023-01-09 (×2): 40 meq via ORAL
  Filled 2023-01-09 (×2): qty 2

## 2023-01-09 MED ORDER — APIXABAN 5 MG PO TABS
5.0000 mg | ORAL_TABLET | Freq: Two times a day (BID) | ORAL | Status: DC
  Administered 2023-01-09: 5 mg via ORAL
  Filled 2023-01-09: qty 1

## 2023-01-09 MED ORDER — LIVING WELL WITH DIABETES BOOK
Freq: Once | Status: DC
  Filled 2023-01-09: qty 1

## 2023-01-09 MED ORDER — AMIODARONE IV BOLUS ONLY 150 MG/100ML
150.0000 mg | Freq: Once | INTRAVENOUS | Status: AC
  Administered 2023-01-09: 150 mg via INTRAVENOUS
  Filled 2023-01-09: qty 100

## 2023-01-09 MED ORDER — MIDODRINE HCL 5 MG PO TABS
5.0000 mg | ORAL_TABLET | Freq: Two times a day (BID) | ORAL | Status: DC
  Administered 2023-01-09 – 2023-01-10 (×2): 5 mg via ORAL
  Filled 2023-01-09 (×2): qty 1

## 2023-01-09 MED ORDER — EMPAGLIFLOZIN 10 MG PO TABS
10.0000 mg | ORAL_TABLET | Freq: Every day | ORAL | Status: DC
Start: 2023-01-09 — End: 2023-01-09

## 2023-01-09 NOTE — Progress Notes (Signed)
Spoke with patient's daughter via phone  Discussed findings on CT scan  Only option open to [place] will be a possible bronchoscopy with significant attendant risk of respiratory failure and the possibility of significant difficulty weaning off the ventilator as patient is very very frail at present with multiple underlying comorbidities  The risk of significant respiratory compromise is high with a bronchoscopy.  I did encourage her to have further discussions with her mom regarding treatment options

## 2023-01-09 NOTE — Progress Notes (Addendum)
PROGRESS NOTE    [name]  WGN:[id number] DOB: [birth year] DOA: 01/04/2023 PCP: Herma Carson, MD  82 yo F with PMH of DVT/PE, CHF (Takotsubo cardiomyopathy), afib, and stroke presented to the ED on 7/27 with weakness, shortness of breath, LE edema, AMS, and slurred speech. Tried increasing bumex as outpatient but did not see significant change to edema. CXR with moderate R pleural effusion, suspected HF and PNA.  -Significant hypoxia on admission, poor response to diuretics, on antibiotics for pneumonia  -ECHO 7/28 showed LVEF 55%, no RWMA, mild LVH, RV mildly reduced, moderately elevated PA pressure, severe MR, severe TR.  -Poor response to diuretics, remains considerably hypoxic  Subjective: -Feels a little better, on 12 L of O2 this morning  Assessment and Plan:  Acute on chronic respiratory failure with hypoxia and hypercapnia (HCC) Acute diastolic CHF, right pleural effusion ?Community acquired pneumonia -Day 4 of antibiotics and diuretics -Significantly limited response to diuretics thus far, will continue -Pulmonary consulted for thoracentesis, plan for today, Eliquis held -Continue ceftriaxone, no fever or leukocytosis and PCT<0.1, suspect has considerable atelectasis adjacent to the pleural effusion  Acute on chronic diastolic CHF Severe mitral regurgitation -Prior history of Takotsubo cardiomyopathy -Echo this admit with EF 55%, mildly reduced RV, severe MR, moderately elevated PA pressures -Poor response to diuretics thus far, thoracentesis today -Continue Lasix 80 Mg twice daily, blood pressure more stable will decrease midodrine dose -Add Jardiance tomorrow -Appreciate cards input  Paroxysmal atrial fibrillation with RVR (HCC) -now on IV amio, continue dig -continue Eliquis   Left leg wound -left non-healing LE wound with purulent drainage and mild cellulitis. She was treated with a course of doxycycline about 3 weeks ago -Continue local wound care, likely  worsened by edema from CHF  Hyperglycemia -A1c is 6.5, consistent with new diagnosis of diabetes  History of DVT/PE -Continue Eliquis  History of CVA -Continue Eliquis  DVT prophylaxis: eliquis Code Status: Full Code Family Communication: No family at bedside Disposition Plan: May need rehab  Consultants:    Procedures:   Antimicrobials:    Objective: Vitals:   01/09/23 0500 01/09/23 0642 01/09/23 0746 01/09/23 1005  BP:  (!) 108/52  131/72  Pulse:  89 (!) 101 (!) 101  Resp:  20 (!) 25 (!) 26  Temp:    (!) 97.4 F (36.3 C)  TempSrc:    Temporal  SpO2:  97% 94% 99%  Weight: 87.6 kg     Height:        Intake/Output Summary (Last 24 hours) at 01/09/2023 1007 Last data filed at 01/09/2023 0600 Gross per 24 hour  Intake 430.54 ml  Output 1300 ml  Net -869.46 ml   Filed Weights   01/07/23 0307 01/08/23 0500 01/09/23 0500  Weight: 89.3 kg 89 kg 87.6 kg    Examination:  General exam: Chronically ill female sitting up in bed, AAOx3 HEENT: Positive JVD CVS: S1-S2, regular rhythm, systolic murmur Lungs: Decreased breath sounds on the right Abdomen: Soft, nontender, bowel sounds present Extremities: 1+ edema, left lower leg with dressing Psychiatry:  Mood & affect appropriate.     Data Reviewed:   CBC: Recent Labs  Lab 01/04/23 1250 01/04/23 1324 01/04/23 1341 01/04/23 1627 01/05/23 0113 01/09/23 0304  WBC 6.8  --   --   --  8.0 8.5  NEUTROABS 4.8  --   --   --   --   --   HGB 12.7 13.9 13.9 13.6 12.5 12.4  HCT 43.8 41.0 41.0  40.0 42.1 40.7  MCV 101.4*  --   --   --  100.5* 97.6  PLT 286  --   --   --  258 195   Basic Metabolic Panel: Recent Labs  Lab 01/05/23 0113 01/05/23 1429 01/07/23 1300 01/08/23 0324 01/09/23 0304  NA 140 142 137 136 134*  K 3.8 3.5 3.4* 3.8 3.4*  CL 93* 90* 88* 85* 86*  CO2 35* 38* 36* 37* 35*  GLUCOSE 155* 187* 187* 100* 91  BUN 20 18 19 20 19   CREATININE 0.81 0.88 0.88 0.87 0.74  CALCIUM 10.0 9.6 8.9 9.1 8.9    GFR: Estimated Creatinine Clearance: 55.7 mL/min (by C-G formula based on SCr of 0.74 mg/dL). Liver Function Tests: Recent Labs  Lab 01/04/23 1250  AST 47*  ALT 29  ALKPHOS 126  BILITOT 1.2  PROT 6.6  ALBUMIN 3.1*   No results for input(s): "LIPASE", "AMYLASE" in the last 168 hours. No results for input(s): "AMMONIA" in the last 168 hours. Coagulation Profile: Recent Labs  Lab 01/04/23 1250  INR 1.9*   Cardiac Enzymes: No results for input(s): "CKTOTAL", "CKMB", "CKMBINDEX", "TROPONINI" in the last 168 hours. BNP (last 3 results) No results for input(s): "PROBNP" in the last 8760 hours. HbA1C: Recent Labs    01/08/23 1154  HGBA1C 6.5*   CBG: No results for input(s): "GLUCAP" in the last 168 hours. Lipid Profile: No results for input(s): "CHOL", "HDL", "LDLCALC", "TRIG", "CHOLHDL", "LDLDIRECT" in the last 72 hours. Thyroid Function Tests: Recent Labs    01/08/23 1536  TSH 1.141   Anemia Panel: No results for input(s): "VITAMINB12", "FOLATE", "FERRITIN", "TIBC", "IRON", "RETICCTPCT" in the last 72 hours. Urine analysis:    Component Value Date/Time   COLORURINE AMBER (A) 01/04/2023 1250   APPEARANCEUR CLOUDY (A) 01/04/2023 1250   LABSPEC 1.023 01/04/2023 1250   PHURINE 7.0 01/04/2023 1250   GLUCOSEU NEGATIVE 01/04/2023 1250   HGBUR NEGATIVE 01/04/2023 1250   BILIRUBINUR NEGATIVE 01/04/2023 1250   KETONESUR NEGATIVE 01/04/2023 1250   PROTEINUR 100 (A) 01/04/2023 1250   UROBILINOGEN 1.0 10/02/2009 1418   NITRITE POSITIVE (A) 01/04/2023 1250   LEUKOCYTESUR LARGE (A) 01/04/2023 1250   Sepsis Labs: @LABRCNTIP (procalcitonin:4,lacticidven:4)  ) Recent Results (from the past 240 hour(s))  Culture, blood (Routine x 2)     Status: None   Collection Time: 01/04/23 12:50 PM   Specimen: BLOOD  Result Value Ref Range Status   Specimen Description BLOOD SITE NOT SPECIFIED  Final   Special Requests   Final    BOTTLES DRAWN AEROBIC AND ANAEROBIC Blood Culture  results may not be optimal due to an inadequate volume of blood received in culture bottles   Culture   Final    NO GROWTH 5 DAYS Performed at [facility], [address], [place], [place] [address]    Report Status 01/09/2023 FINAL  Final  Urine Culture     Status: Abnormal   Collection Time: 01/04/23 12:50 PM   Specimen: Urine, Random  Result Value Ref Range Status   Specimen Description URINE, RANDOM  Final   Special Requests   Final    NONE Reflexed from [id number] Performed at [facility], [address] [place], [place] [address]    Culture MULTIPLE SPECIES PRESENT, SUGGEST RECOLLECTION (A)  Final   Report Status 01/06/2023 FINAL  Final  Culture, blood (Routine x 2)     Status: Abnormal   Collection Time: 01/04/23 12:55 PM   Specimen:  BLOOD  Result Value Ref Range Status   Specimen Description BLOOD SITE NOT SPECIFIED  Final   Special Requests   Final    BOTTLES DRAWN AEROBIC AND ANAEROBIC Blood Culture results may not be optimal due to an inadequate volume of blood received in culture bottles   Culture  Setup Time   Final    GRAM POSITIVE RODS ANAEROBIC BOTTLE ONLY CRITICAL RESULT CALLED TO, READ BACK BY AND VERIFIED WITH: PHARMD J. LEDFORD 01/05/23 @ 2133 BY AB    Culture (A)  Final    CORYNEBACTERIUM UREALYTICUM Standardized susceptibility testing for this organism is not available. Performed at [facility], [address] [place], [place] [address]    Report Status 01/08/2023 FINAL  Final  SARS Coronavirus 2 by RT PCR (hospital order, performed in [facility] hospital lab) *cepheid single result test* Anterior Nasal Swab     Status: None   Collection Time: 01/04/23  7:12 PM   Specimen: Anterior Nasal Swab  Result Value Ref Range Status   SARS Coronavirus 2 by RT PCR NEGATIVE NEGATIVE Final    Comment: Performed at [facility], [address], [place], [place] [address]  MRSA Next Gen by PCR, Nasal     Status: None   Collection Time:  01/05/23  1:40 AM   Specimen: Nasal Mucosa; Nasal Swab  Result Value Ref Range Status   MRSA by PCR Next Gen NOT DETECTED NOT DETECTED Final    Comment: (NOTE) The GeneXpert MRSA Assay (FDA approved for NASAL specimens only), is one component of a comprehensive MRSA colonization surveillance program. It is not intended to diagnose MRSA infection nor to guide or monitor treatment for MRSA infections. Test performance is not FDA approved in patients less than 23 years old. Performed at [facility], [address], [place], [place] [address]      Radiology Studies: DG CHEST PORT 1 VIEW  Result Date: 01/09/2023 CLINICAL DATA:  142230 Pleural effusion 142230 EXAM: PORTABLE CHEST - 1 VIEW COMPARISON:  01/08/2023, 01/04/2023 FINDINGS: The patient is rotated to the right. The mediastinum and cardiac silhouette are obscured, unchanged. Complete opacification of the right hemithorax. Similar appearing mild hazy opacities in the left lung base. The remaining left lung is clear. No acute osseous abnormality. IMPRESSION: 1. Complete opacification of the right hemithorax, likely due to a combination of pleural effusion and airspace disease. 2. Similar appearing mild hazy opacities in the left lung base. Electronically Signed   By: Marliss Coots M.D.   On: 01/09/2023 08:09   DG CHEST PORT 1 VIEW  Result Date: 01/08/2023 CLINICAL DATA:  Hypoxia EXAM: PORTABLE CHEST - 1 VIEW COMPARISON:  01/04/2023 FINDINGS: Complete opacification of right hemithorax since prior exam, with mild rightward mediastinal shift as before. Coarse perihilar interstitial opacities in the left without new infiltrate. Heart size is occult to assess due to adjacent opacities. Aortic Atherosclerosis (ICD10-170.0). Visualized bones unremarkable. IMPRESSION: Complete opacification of right hemithorax, new since prior exam. Electronically Signed   By: Corlis Leak M.D.   On: 01/08/2023 09:35     Scheduled Meds:  [MAR Hold]  acetylcysteine  4 mL Nebulization BID   [MAR Hold] allopurinol  100 mg Oral Daily   [MAR Hold] aspirin  81 mg Oral Daily   [MAR Hold] buPROPion  300 mg Oral Daily   [MAR Hold] calcium carbonate  1 tablet Oral BID WC   [MAR Hold] cholecalciferol  1,000 Units Oral Daily   [MAR  Hold] cyanocobalamin  1,000 mcg Oral Daily   [MAR Hold] digoxin  0.125 mg Oral Daily   [MAR Hold] furosemide  80 mg Intravenous BID   [MAR Hold] gabapentin  400 mg Oral Daily   [MAR Hold] guaiFENesin  600 mg Oral BID   [MAR Hold] midodrine  10 mg Oral BID WC   [MAR Hold] pantoprazole  40 mg Oral Daily   [MAR Hold] potassium chloride  40 mEq Oral BID   [MAR Hold] rosuvastatin  5 mg Oral Daily   [MAR Hold] senna-docusate  1 tablet Oral BID   [MAR Hold] traMADol  50 mg Oral BID   Continuous Infusions:  amiodarone 30 mg/hr (01/09/23 0600)     LOS: 5 days    Time spent:    Zannie Cove, MD [facility]   01/09/2023, 10:07 AM

## 2023-01-09 NOTE — Progress Notes (Signed)
Tolerated thoracentesis well but started complaining of some chest discomfort after draining about 1.1 L.  Procedure was terminated at the time  Postprocedure chest x-ray still shows significant amount of fluid with some aeration of the lung on the right  Will schedule patient for CT scan of the chest to further evaluate for extent of atelectasis

## 2023-01-09 NOTE — Inpatient Diabetes Management (Signed)
Inpatient Diabetes Program Recommendations  AACE/ADA: New Consensus Statement on Inpatient Glycemic Control (2015)  Target Ranges:  Prepandial:   less than 140 mg/dL      Peak postprandial:   less than 180 mg/dL (1-2 hours)      Critically ill patients:  140 - 180 mg/dL   Lab Results  Component Value Date   GLUCAP 74 11/02/2018   HGBA1C 6.5 (H) 01/08/2023    Latest Reference Range & Units 01/09/23 03:04  Glucose 70 - 99 mg/dL 91    Review of Glycemic Control  Diabetes history: New Onset DM2 A1c 6.5 Current orders for Inpatient glycemic control: None  Inpatient Diabetes Program Recommendations:   Ordered Living Well With Diabetes Booklet. Spoke with patient about A1c of 6.5. and to limit sugars in drinks and food.  Patient eating poorly @ this time.  Thank you, Billy Fischer. Vastie Douty, RN, MSN, CDE  Diabetes Coordinator Inpatient Glycemic Control Team Team Pager [phone] (8am-5pm) 01/09/2023 1:46 PM

## 2023-01-09 NOTE — Progress Notes (Addendum)
Rounding Note    Patient Name: [name] Date of Encounter: 01/09/2023  [facility] Cardiologist:  [facility]   Subjective   Shortness of breath much improved.  She is laying in the bed with increased respiratory effort.  However much improved from yesterday when she was not able to sit in bed.  Still requiring significant oxygen demands and remains to be on BiPAP/nonrebreather.  Inpatient Medications    Scheduled Meds:  acetylcysteine  4 mL Nebulization BID   allopurinol  100 mg Oral Daily   aspirin  81 mg Oral Daily   buPROPion  300 mg Oral Daily   calcium carbonate  1 tablet Oral BID WC   cholecalciferol  1,000 Units Oral Daily   cyanocobalamin  1,000 mcg Oral Daily   digoxin  0.125 mg Oral Daily   furosemide  80 mg Intravenous BID   gabapentin  400 mg Oral Daily   guaiFENesin  600 mg Oral BID   midodrine  10 mg Oral BID WC   pantoprazole  40 mg Oral Daily   rosuvastatin  5 mg Oral Daily   senna-docusate  1 tablet Oral BID   traMADol  50 mg Oral BID   Continuous Infusions:  amiodarone 30 mg/hr (01/09/23 0600)   PRN Meds: ipratropium-albuterol   Vital Signs    Vitals:   01/09/23 0052 01/09/23 0257 01/09/23 0315 01/09/23 0500  BP: 96/66 (!) 101/58 (!) 119/104   Pulse:  95 88   Resp: 20 20 18    Temp: (!) 97.5 F (36.4 C) 97.6 F (36.4 C)    TempSrc: Axillary Oral    SpO2:  98% 99%   Weight:    87.6 kg  Height:        Intake/Output Summary (Last 24 hours) at 01/09/2023 0657 Last data filed at 01/09/2023 0600 Gross per 24 hour  Intake 670.54 ml  Output 1300 ml  Net -629.46 ml      01/09/2023    5:00 AM 01/08/2023    5:00 AM 01/07/2023    3:07 AM  Last 3 Weights  Weight (lbs) 193 lb 2 oz 196 lb 3.4 oz 196 lb 13.9 oz  Weight (kg) 87.6 kg 89 kg 89.3 kg      Telemetry    Atrial fibrillation heart rates 90s- Personally Reviewed  ECG    No new tracings- Personally Reviewed  Physical Exam   GEN: No acute distress.   Neck: mild  JVD Cardiac: Irregularly irregular Respiratory: Coarse, diminished/distant lung sounds GI: Soft, nontender, non-distended  MS: 2+ pitting edema. R>L Neuro:  Nonfocal  Psych: Normal affect   Labs    High Sensitivity Troponin:   Recent Labs  Lab 01/04/23 1250 01/04/23 1717  TROPONINIHS 24* 24*     Chemistry Recent Labs  Lab 01/04/23 1250 01/04/23 1324 01/07/23 1300 01/08/23 0324 01/09/23 0304  NA 141   < > 137 136 134*  K 5.1   < > 3.4* 3.8 3.4*  CL 93*   < > 88* 85* 86*  CO2 33*   < > 36* 37* 35*  GLUCOSE 152*   < > 187* 100* 91  BUN 23   < > 19 20 19   CREATININE 0.81   < > 0.88 0.87 0.74  CALCIUM 10.3   < > 8.9 9.1 8.9  PROT 6.6  --   --   --   --   ALBUMIN 3.1*  --   --   --   --  AST 47*  --   --   --   --   ALT 29  --   --   --   --   ALKPHOS 126  --   --   --   --   BILITOT 1.2  --   --   --   --   GFRNONAA >60   < > >60 >60 >60  ANIONGAP 15   < > 13 14 13    < > = values in this interval not displayed.    Lipids No results for input(s): "CHOL", "TRIG", "HDL", "LABVLDL", "LDLCALC", "CHOLHDL" in the last 168 hours.  Hematology Recent Labs  Lab 01/04/23 1250 01/04/23 1324 01/04/23 1627 01/05/23 0113 01/09/23 0304  WBC 6.8  --   --  8.0 8.5  RBC 4.32  --   --  4.19 4.17  HGB 12.7   < > 13.6 12.5 12.4  HCT 43.8   < > 40.0 42.1 40.7  MCV 101.4*  --   --  100.5* 97.6  MCH 29.4  --   --  29.8 29.7  MCHC 29.0*  --   --  29.7* 30.5  RDW 14.2  --   --  14.3 14.4  PLT 286  --   --  258 195   < > = values in this interval not displayed.   Thyroid  Recent Labs  Lab 01/08/23 1536  TSH 1.141    BNP Recent Labs  Lab 01/04/23 1250  BNP 563.3*    DDimer No results for input(s): "DDIMER" in the last 168 hours.   Radiology    DG CHEST PORT 1 VIEW  Result Date: 01/08/2023 CLINICAL DATA:  Hypoxia EXAM: PORTABLE CHEST - 1 VIEW COMPARISON:  01/04/2023 FINDINGS: Complete opacification of right hemithorax since prior exam, with mild rightward mediastinal  shift as before. Coarse perihilar interstitial opacities in the left without new infiltrate. Heart size is occult to assess due to adjacent opacities. Aortic Atherosclerosis (ICD10-170.0). Visualized bones unremarkable. IMPRESSION: Complete opacification of right hemithorax, new since prior exam. Electronically Signed   By: Corlis Leak M.D.   On: 01/08/2023 09:35    Cardiac Studies   Echocardiogram 01/05/2023  1. Left ventricular ejection fraction, by estimation, is 55% with beat to  beat variability. The left ventricle has normal function. The left  ventricle has no regional wall motion abnormalities. There is mild left  ventricular hypertrophy. Left  ventricular diastolic parameters are indeterminate.   2. Right ventricular systolic function is mildly reduced. The right  ventricular size is moderately enlarged. There is moderately elevated  pulmonary artery systolic pressure. The estimated right ventricular  systolic pressure is 50.5 mmHg.   3. Left atrial size was severely dilated.   4. Right atrial size was severely dilated.   5. The mitral valve is degenerative. Severe mitral valve regurgitation.  No evidence of mitral stenosis. Severe mitral annular calcification.   6. 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   7. The aortic valve is tricuspid. There is mild calcification of the  aortic valve. Aortic valve regurgitation is mild. No aortic stenosis is  present.   8. The inferior vena cava is dilated in size with <50% respiratory  variability, suggesting right atrial pressure of 15 mmHg.   Patient Profile     82 y.o. female  with a hx of Takotsubo cardiomyopathy 2011, mod-severe TR, paroxysmal atrial fibrillation, CVA 1991, DVT/PE, chronic hypoxic respiratory failure on 2 L, hyperlipidemia, bilateral carotid artery stenosis.  She  was admitted on 01/08/2023 for evaluation of HFpEF exacerbation/newly diagnosed severe MR.  Assessment & Plan    Acute on chronic HFpEF with moderate right  pleural effusion Patient presenting with worsening signs of orthopnea/shortness of breath for the last 2 weeks.  Failed outpatient titration of Bumex.  Echocardiogram on admission showed preserved EF of 55% with mild reduction in RV function.  RVSP 50.  Her blood pressure has remained persistently low and requiring midodrine 10 mg twice daily.  Still remains to be volume overloaded with improved symptoms of shortness of breath despite significant oxygen requirements and remains to be on BiPAP/nonrebreather.  Has responded well to diuretics however I's and O's have not accurately been documented due to episodes of incontinence.  Still remains to have 2+ pitting edema, mild JVD.  She has plans for thoracentesis today.  Continue diuresis plan as below.  Continue IV Lasix 80 mg twice daily.  Monitor blood pressure closely. Initiate SGLT2 inhibitor once more adequately diuresed, and once plans for catheterization have been addressed. Continue midodrine 10 mg twice daily, digoxin 0.125 mg Stopped lopressor 25mg  BID given acute decompensation.  Titrate GDMT after diuresis and better BP.   Paroxysmal atrial fibrillation Presumed to have been maintaining normal sinus rhythm since at least 2022 ZIO monitor showed no afib, no documented occurrences till now.  Rates decently controlled in the 90s.  Arrhythmia likely driven by heart failure exacerbation/possible pneumonia.  Continue to treat underlying causes.  If she remains to be in atrial fibrillation can plan for DCCV outpatient.  Resume Eliquis 5 mg twice daily after thoracentesis. Continue IV amio given hypotension and further rate control. Continue digoxin 0.125 mg.  TSH normal    Acute on chronic respiratory failure with hypoxia and hypercapnia Likely multifactorial given heart failure exacerbation and pneumonia.  Suspect that she also has underlying COPD that needs further evaluation.  Has significant respiratory demands.  On BiPAP.   Severe  TR/MR Chronically has had moderate to severe TR.  TR velocity 2.98.  Mean mitral gradient 3.  Now has new onset of MR.  Suspects this may be attributed due to hypervolemia.  Will need to repeat echocardiogram to evaluate once sufficiently diuresed.     Hyperlipidemia Continue rosuvastatin 5 mg   History of DVT/PE History of CVA Cellulitis CAP Per primary team  For questions or updates, please contact [facility] Please consult [email or web address] for contact info under        Signed, Abagail Kitchens, PA-C  01/09/2023, 6:57 AM     Patient seen and examined with Yvonna Alanis PA-C.  Agree as above, with the following exceptions and changes as noted below.  Patient remains on BiPAP this morning while sleeping, but is diuresing appropriately, though remains net even, aim for goal of net -1 to 2 L. Gen: NAD, CV: iRRR, no murmurs, Lungs: Lateral crackles, Abd: soft, Extrem: Warm, well perfused, 1+ edema, Neuro/Psych: alert and oriented x 3, normal mood and affect. All available labs, radiology testing, previous records reviewed.  Continue diuresis as noted above.  Parke Poisson, MD 01/09/23 10:41 AM

## 2023-01-09 NOTE — Progress Notes (Signed)
CT scan reviewed showing atelectasis right lung  Does have some persistence of a large right effusion and some left sided effusion  Patient is quite frail, requiring BiPAP at present  Escalation with needing a bronchoscopy was discussed with the patient following discussing CT scan findings  Difficulty will be that we may not be able to extubate patient following bronchoscopy  Patient stated she will discuss this with her children who will be coming in for a visit later today   She remains very frail  Did discuss findings with Dr. Jomarie Longs as well

## 2023-01-09 NOTE — Progress Notes (Signed)
Pt just came back from CT scan, RN called d/t pt w/ increased WOB and pt c/o SOB upon returning from CT scan.  RN was placing in bipap when I came to room, per RN there is no pneumothorax s/p thoracentesis.  Sat 85-88 initially, fio2 increased to 70%, sat slowly improving to 89-91% now.  CPT held at this time d/t pt distress, pt is slowly recovering.  PRN HHN given.

## 2023-01-09 NOTE — Progress Notes (Signed)
PT Cancellation Note  Patient Details Name: [name] MRN: [id number] DOB: 1940-09-18   Cancelled Treatment:    Reason Eval/Treat Not Completed: Patient not medically ready. Per RN, pt increasingly SOB this AM and had to be placed back on BiPAP. Plan for thoracentesis today. PT will continue to f/u with pt acutely as available and appropriate.    Alessandra Bevels Fumie Fiallo 01/09/2023, 8:20 AM

## 2023-01-09 NOTE — Procedures (Signed)
Thoracentesis  Procedure Note  [name]  [id number]  1940-07-28  Date:01/09/23  Time:10:48 AM   Provider Performing:Brynn Mulgrew A Izea Livolsi   Procedure: Thoracentesis with imaging guidance (40102)  Indication(s) Pleural Effusion  Consent Risks of the procedure as well as the alternatives and risks of each were explained to the patient and/or caregiver.  Consent for the procedure was obtained and is signed in the bedside chart  Anesthesia Topical only with 1% lidocaine    Time Out Verified patient identification, verified procedure, site/side was marked, verified correct patient position, special equipment/implants available, medications/allergies/relevant history reviewed, required imaging and test results available.   Sterile Technique Maximal sterile technique including full sterile barrier drape, hand hygiene, sterile gown, sterile gloves, mask, hair covering, sterile ultrasound probe cover (if used).  Procedure Description Ultrasound was used to identify appropriate pleural anatomy for placement and overlying skin marked.  Area of drainage cleaned and draped in sterile fashion. Lidocaine was used to anesthetize the skin and subcutaneous tissue.  1100 cc's of bloody appearing fluid was drained from the right pleural space. Catheter then removed and bandaid applied to site.   Complications/Tolerance None; patient tolerated the procedure well. Chest X-ray is ordered to confirm no post-procedural complication.   EBL none   Specimen(s) Pleural fluid

## 2023-01-09 NOTE — Progress Notes (Signed)
   Heart Failure Stewardship Pharmacist Progress Note   PCP: Herma Carson, MD PCP-Cardiologist: None    HPI:  82 yo F with PMH of DVT/PE, CHF (Takotsubo cardiomyopathy), afib, and stroke.  Presented to the ED on 7/27 with weakness, shortness of breath, LE edema, AMS, and slurred speech. Tried increasing bumex as outpatient but did not see significant change to edema. CXR with moderate R pleural effusion, suspected HF and PNA. ECHO 7/28 showed LVEF 55%, no RWMA, mild LVH, RV mildly reduced, moderately elevated PA pressure, severe MR, severe TR. Has been on and off bipap. CXR 7/31 with complete opacification of R hemithorax, new since prior exam. Pulmonary consulted for thoracentesis, planned for 8/1.    Current HF Medications: Diuretic: furosemide 80 mg IV BID Other: digoxin 0.125 mg daily *on midodrine 10 mg BID  Prior to admission HF Medications: Diuretic: bumex 2 mg daily Beta blocker: metoprolol tartrate 25 mg BID Other: digoxin 0.125 mg daily  Pertinent Lab Values: Serum creatinine 0.74, BUN 19, Potassium 3.4, Sodium 134, BNP 563.3, Digoxin <0.2 (7/27)  Vital Signs: Weight: 193 lbs (admission weight: 194 lbs) Blood pressure: 80-110/60s  Heart rate: 80-100s  I/O: net -0.5L yesterday; net even since admission (not accurate)  Medication Assistance / Insurance Benefits Check: Does the patient have prescription insurance?  Yes Type of insurance plan: UHC Medicare  Does the patient qualify for medication assistance through manufacturers or grants?   Pending Eligible grants and/or patient assistance programs: pending Medication assistance applications in progress: none  Medication assistance applications approved: none Approved medication assistance renewals will be completed by: pending  Outpatient Pharmacy:  Prior to admission outpatient pharmacy: [facility] Is the patient willing to use [facility] TOC pharmacy at discharge? Yes Is the patient willing to transition their outpatient  pharmacy to utilize a [facility] outpatient pharmacy? No    Assessment: 1. Acute on chronic diastolic CHF (LVEF 55-60%), due to NICM. NYHA class III-IV symptoms. - Continue furosemide 80 mg IV BID. Strict I/Os and daily weights. KCl 40 mEq x 2 ordered for replacement.  - Consider holding metoprolol if signs of low output. Currently warm on exam.  - Hold ACE/ARB/ARNI and MRA to allow for aggressive diuresis. Has been hypotensive requiring addition of midodrine.  - Consider adding SGLT2i prior to discharge pending mobility - Continue digoxin 0.125 mg daily   Plan: 1) Medication changes recommended at this time: - Continue IV diuresis  2) Patient assistance: - Jardiance/Farxiga $47  3)  Education  - Patient has been educated on current HF medications and potential additions to HF medication regimen - Patient verbalizes understanding that over the next few months, these medication doses may change and more medications may be added to optimize HF regimen - Patient has been educated on basic disease state pathophysiology and goals of therapy   Sharen Hones, PharmD, BCPS Heart Failure Stewardship Pharmacist Phone [phone]

## 2023-01-09 NOTE — Progress Notes (Signed)
   NAME:  [name], MRN:  [id number], DOB:  1941-05-02, LOS: 5 ADMISSION DATE:  01/04/2023, CONSULTATION DATE: 01/08/2023 REFERRING MD: Dr. Jomarie Longs, CHIEF COMPLAINT: Right pleural effusion  History of Present Illness:  Asked to see for possible thoracentesis   Patient with history of congestive heart failure, failing diuretics with chest x-ray showing whiteout of the right lung   She does have a mild cough, not really bringing up any secretions She did increase her Bumex at home but did not notice significant increase in diuresis She is requiring BiPAP on and off  Pertinent  Medical History   Past Medical History:  Diagnosis Date   Cardiac murmur    Echo, EF-55-60, mild tricuspid regurgitation   CVA (cerebral vascular accident) (HCC) 1991   DVT (deep venous thrombosis) (HCC)    PE (pulmonary embolism)    Takotsubo syndrome 08/2009   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   EF - 55-60, Tricuspid valve - mild-moderate regurgitation; 12/14/2009 - EF-55, mild to moderate tricuspid regurgitation     Significant Hospital Events: Including procedures, antibiotic start and stop dates in addition to other pertinent events   Chest x-ray 01/09/2023-persistent atelectasis right lung Ultrasound chest 01/08/2023-right effusion, consolidation  Interim History / Subjective:  Shortness of breath Better than yesterday, on oxygen via nasal cannula  Objective   Blood pressure (!) 108/52, pulse 89, temperature 97.6 F (36.4 C), temperature source Oral, resp. rate 20, height 5\' 2"  (1.575 m), weight 87.6 kg, SpO2 97%.    FiO2 (%):  [50 %-60 %] 50 % PEEP:  [5 cmH20] 5 cmH20   Intake/Output Summary (Last 24 hours) at 01/09/2023 0741 Last data filed at 01/09/2023 0600 Gross per 24 hour  Intake 670.54 ml  Output 1300 ml  Net -629.46 ml   Filed Weights   01/07/23 0307 01/08/23 0500 01/09/23 0500  Weight: 89.3 kg 89 kg 87.6 kg    Examination: General: Chronically ill-appearing HENT: Moist oral  mucosa Lungs: Decreased air movement on the right, clear breath sounds on left Cardiovascular: S1-S2 appreciated Abdomen: Soft, bowel sounds appreciated Extremities: Bilateral lower extremity edema Neuro: Alert and oriented x 3 GU:   Resolved Hospital Problem list     Assessment & Plan:  Acute on chronic respiratory failure with hypoxia and hypercapnia Diastolic heart failure  Right pleural effusion -Planning for thoracentesis today -Eliquis on hold  She continues on diuretics  For atelectasis, Mucomyst and Mucinex added  Possible pneumonia -Completed course of antibiotics  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

## 2023-01-10 ENCOUNTER — Inpatient Hospital Stay (HOSPITAL_COMMUNITY): Payer: Medicare Other

## 2023-01-10 ENCOUNTER — Other Ambulatory Visit (HOSPITAL_COMMUNITY): Payer: Medicare Other

## 2023-01-10 ENCOUNTER — Other Ambulatory Visit (HOSPITAL_COMMUNITY): Payer: Self-pay

## 2023-01-10 DIAGNOSIS — J9811 Atelectasis: Secondary | ICD-10-CM

## 2023-01-10 DIAGNOSIS — J9622 Acute and chronic respiratory failure with hypercapnia: Secondary | ICD-10-CM | POA: Diagnosis not present

## 2023-01-10 DIAGNOSIS — J9621 Acute and chronic respiratory failure with hypoxia: Secondary | ICD-10-CM | POA: Diagnosis not present

## 2023-01-10 LAB — GLUCOSE, CAPILLARY
Glucose-Capillary: 109 mg/dL — ABNORMAL HIGH (ref 70–99)
Glucose-Capillary: 121 mg/dL — ABNORMAL HIGH (ref 70–99)
Glucose-Capillary: 150 mg/dL — ABNORMAL HIGH (ref 70–99)

## 2023-01-10 LAB — POCT I-STAT 7, (LYTES, BLD GAS, ICA,H+H)
Acid-Base Excess: 17 mmol/L — ABNORMAL HIGH (ref 0.0–2.0)
Bicarbonate: 45.1 mmol/L — ABNORMAL HIGH (ref 20.0–28.0)
Calcium, Ion: 1.16 mmol/L (ref 1.15–1.40)
HCT: 43 % (ref 36.0–46.0)
Hemoglobin: 14.6 g/dL (ref 12.0–15.0)
O2 Saturation: 91 %
Patient temperature: 36.8
Potassium: 3.4 mmol/L — ABNORMAL LOW (ref 3.5–5.1)
Sodium: 138 mmol/L (ref 135–145)
TCO2: 47 mmol/L — ABNORMAL HIGH (ref 22–32)
pCO2 arterial: 63.6 mmHg — ABNORMAL HIGH (ref 32–48)
pH, Arterial: 7.457 — ABNORMAL HIGH (ref 7.35–7.45)
pO2, Arterial: 59 mmHg — ABNORMAL LOW (ref 83–108)

## 2023-01-10 LAB — COMPREHENSIVE METABOLIC PANEL
ALT: 27 U/L (ref 0–44)
AST: 41 U/L (ref 15–41)
Albumin: 2.8 g/dL — ABNORMAL LOW (ref 3.5–5.0)
Alkaline Phosphatase: 136 U/L — ABNORMAL HIGH (ref 38–126)
Anion gap: 12 (ref 5–15)
BUN: 14 mg/dL (ref 8–23)
CO2: 41 mmol/L — ABNORMAL HIGH (ref 22–32)
Calcium: 9.3 mg/dL (ref 8.9–10.3)
Chloride: 84 mmol/L — ABNORMAL LOW (ref 98–111)
Creatinine, Ser: 0.83 mg/dL (ref 0.44–1.00)
GFR, Estimated: >60 mL/min (ref >60)
Glucose, Bld: 130 mg/dL — ABNORMAL HIGH (ref 70–99)
Potassium: 3.7 mmol/L (ref 3.5–5.1)
Sodium: 137 mmol/L (ref 135–145)
Total Bilirubin: 0.9 mg/dL (ref 0.3–1.2)
Total Protein: 6.6 g/dL (ref 6.5–8.1)

## 2023-01-10 LAB — CULTURE, BAL-QUANTITATIVE W GRAM STAIN

## 2023-01-10 MED ORDER — DOCUSATE SODIUM 50 MG/5ML PO LIQD: 100.0000 mg | Freq: Two times a day (BID) | ORAL | Status: DC

## 2023-01-10 MED ORDER — GUAIFENESIN 100 MG/5ML PO LIQD
10.0000 mL | Freq: Four times a day (QID) | ORAL | Status: DC
  Administered 2023-01-10 – 2023-01-12 (×6): 10 mL via ORAL
  Filled 2023-01-10 (×5): qty 10

## 2023-01-10 MED ORDER — SODIUM CHLORIDE 0.9 % IV SOLN
1.0000 g | INTRAVENOUS | Status: AC
  Administered 2023-01-10 – 2023-01-13 (×4): 1 g via INTRAVENOUS
  Filled 2023-01-10 (×4): qty 10

## 2023-01-10 MED ORDER — ROSUVASTATIN CALCIUM 5 MG PO TABS
5.0000 mg | ORAL_TABLET | Freq: Every day | ORAL | Status: DC
  Administered 2023-01-11 – 2023-01-13 (×3): 5 mg via ORAL
  Filled 2023-01-10 (×3): qty 1

## 2023-01-10 MED ORDER — PROPOFOL 1000 MG/100ML IV EMUL: 0.0000 ug/kg/min | INTRAVENOUS | Status: DC

## 2023-01-10 MED ORDER — ROCURONIUM BROMIDE 10 MG/ML (PF) SYRINGE
60.0000 mg | PREFILLED_SYRINGE | Freq: Once | INTRAVENOUS | Status: AC
  Administered 2023-01-10: 60 mg via INTRAVENOUS

## 2023-01-10 MED ORDER — ETOMIDATE 2 MG/ML IV SOLN
20.0000 mg | Freq: Once | INTRAVENOUS | Status: AC
  Administered 2023-01-10: 20 mg via INTRAVENOUS

## 2023-01-10 MED ORDER — GABAPENTIN 400 MG PO CAPS
400.0000 mg | ORAL_CAPSULE | Freq: Every day | ORAL | Status: DC
  Administered 2023-01-10 – 2023-01-16 (×5): 400 mg via ORAL
  Administered 2023-01-17: 300 mg via ORAL
  Administered 2023-01-18 – 2023-01-30 (×13): 400 mg via ORAL
  Filled 2023-01-10 (×19): qty 1

## 2023-01-10 MED ORDER — FENTANYL CITRATE PF 50 MCG/ML IJ SOSY
50.0000 ug | PREFILLED_SYRINGE | Freq: Once | INTRAMUSCULAR | Status: AC
  Administered 2023-01-10: 50 ug via INTRAVENOUS

## 2023-01-10 MED ORDER — BISACODYL 10 MG RE SUPP
10.0000 mg | Freq: Once | RECTAL | Status: AC
  Administered 2023-01-10: 10 mg via RECTAL
  Filled 2023-01-10: qty 1

## 2023-01-10 MED ORDER — NOREPINEPHRINE 4 MG/250ML-% IV SOLN
2.0000 ug/min | INTRAVENOUS | Status: DC
  Administered 2023-01-10: 4 ug/min via INTRAVENOUS

## 2023-01-10 MED ORDER — ACETYLCYSTEINE 20 % IN SOLN
4.0000 mL | Freq: Once | RESPIRATORY_TRACT | Status: AC
  Administered 2023-01-10: 4 mL via RESPIRATORY_TRACT
  Filled 2023-01-10: qty 4

## 2023-01-10 MED ORDER — SODIUM CHLORIDE 0.9 % IV BOLUS
250.0000 mL | Freq: Once | INTRAVENOUS | Status: AC
  Administered 2023-01-10: 250 mL via INTRAVENOUS

## 2023-01-10 MED ORDER — MIDAZOLAM HCL 2 MG/2ML IJ SOLN
INTRAMUSCULAR | Status: AC
  Filled 2023-01-10: qty 2

## 2023-01-10 MED ORDER — ASPIRIN 81 MG PO CHEW: 81.0000 mg | CHEWABLE_TABLET | Freq: Every day | ORAL | Status: DC

## 2023-01-10 MED ORDER — ALLOPURINOL 100 MG PO TABS: 100.0000 mg | ORAL_TABLET | Freq: Every day | ORAL | Status: DC

## 2023-01-10 MED ORDER — GUAIFENESIN ER 600 MG PO TB12
600.0000 mg | ORAL_TABLET | Freq: Two times a day (BID) | ORAL | Status: DC
  Administered 2023-01-10 – 2023-01-30 (×35): 600 mg via ORAL
  Filled 2023-01-10 (×41): qty 1

## 2023-01-10 MED ORDER — FENTANYL CITRATE PF 50 MCG/ML IJ SOSY: 25.0000 ug | PREFILLED_SYRINGE | INTRAMUSCULAR | Status: DC | PRN

## 2023-01-10 MED ORDER — GABAPENTIN 250 MG/5ML PO SOLN
400.0000 mg | Freq: Three times a day (TID) | ORAL | Status: DC
  Filled 2023-01-10 (×2): qty 8

## 2023-01-10 MED ORDER — CALCIUM CARBONATE 1250 (500 CA) MG PO TABS
1.0000 | ORAL_TABLET | Freq: Two times a day (BID) | ORAL | Status: DC
  Filled 2023-01-10: qty 1

## 2023-01-10 MED ORDER — TRAMADOL HCL 50 MG PO TABS
50.0000 mg | ORAL_TABLET | Freq: Two times a day (BID) | ORAL | Status: DC
  Administered 2023-01-10 – 2023-01-29 (×27): 50 mg via ORAL
  Filled 2023-01-10 (×34): qty 1

## 2023-01-10 MED ORDER — PANTOPRAZOLE SODIUM 40 MG PO TBEC
40.0000 mg | DELAYED_RELEASE_TABLET | Freq: Every day | ORAL | Status: DC
  Administered 2023-01-11 – 2023-01-12 (×2): 40 mg via ORAL
  Filled 2023-01-10 (×2): qty 1

## 2023-01-10 MED ORDER — PANTOPRAZOLE SODIUM 40 MG IV SOLR
40.0000 mg | INTRAVENOUS | Status: DC
  Administered 2023-01-10: 40 mg via INTRAVENOUS
  Filled 2023-01-10: qty 10

## 2023-01-10 MED ORDER — VITAMIN B-12 1000 MCG PO TABS
1000.0000 ug | ORAL_TABLET | Freq: Every day | ORAL | Status: DC
  Administered 2023-01-11 – 2023-01-30 (×18): 1000 ug via ORAL
  Filled 2023-01-10 (×20): qty 1

## 2023-01-10 MED ORDER — PHENYLEPHRINE 80 MCG/ML (10ML) SYRINGE FOR IV PUSH (FOR BLOOD PRESSURE SUPPORT)
PREFILLED_SYRINGE | INTRAVENOUS | Status: AC
  Filled 2023-01-10: qty 10

## 2023-01-10 MED ORDER — VITAMIN B-12 1000 MCG PO TABS: 1000.0000 ug | ORAL_TABLET | Freq: Every day | ORAL | Status: DC

## 2023-01-10 MED ORDER — ETOMIDATE 2 MG/ML IV SOLN
INTRAVENOUS | Status: AC
  Filled 2023-01-10: qty 10

## 2023-01-10 MED ORDER — AZITHROMYCIN 500 MG PO TABS
500.0000 mg | ORAL_TABLET | Freq: Every day | ORAL | Status: DC
  Filled 2023-01-10: qty 1

## 2023-01-10 MED ORDER — DIGOXIN 0.25 MG/ML IJ SOLN
0.1250 mg | Freq: Once | INTRAMUSCULAR | Status: AC
  Administered 2023-01-10: 0.125 mg via INTRAVENOUS
  Filled 2023-01-10: qty 0.5

## 2023-01-10 MED ORDER — TRAMADOL HCL 50 MG PO TABS: 50.0000 mg | ORAL_TABLET | Freq: Two times a day (BID) | ORAL | Status: DC

## 2023-01-10 MED ORDER — BUPROPION HCL 100 MG PO TABS
100.0000 mg | ORAL_TABLET | Freq: Three times a day (TID) | ORAL | Status: DC
  Filled 2023-01-10 (×2): qty 1

## 2023-01-10 MED ORDER — APIXABAN 5 MG PO TABS
5.0000 mg | ORAL_TABLET | Freq: Two times a day (BID) | ORAL | Status: DC
  Administered 2023-01-10 – 2023-01-13 (×6): 5 mg via ORAL
  Filled 2023-01-10 (×6): qty 1

## 2023-01-10 MED ORDER — DIGOXIN 125 MCG PO TABS: 0.1250 mg | ORAL_TABLET | Freq: Every day | ORAL | Status: DC

## 2023-01-10 MED ORDER — DOCUSATE SODIUM 100 MG PO CAPS
100.0000 mg | ORAL_CAPSULE | Freq: Every day | ORAL | Status: DC
  Administered 2023-01-10 – 2023-01-18 (×7): 100 mg via ORAL
  Filled 2023-01-10 (×7): qty 1

## 2023-01-10 MED ORDER — MIDODRINE HCL 5 MG PO TABS
5.0000 mg | ORAL_TABLET | Freq: Two times a day (BID) | ORAL | Status: DC
  Administered 2023-01-10 – 2023-01-19 (×11): 5 mg via ORAL
  Filled 2023-01-10 (×11): qty 1

## 2023-01-10 MED ORDER — SODIUM CHLORIDE 0.9 % IV SOLN: 250.0000 mL | INTRAVENOUS | Status: DC

## 2023-01-10 MED ORDER — CHLORHEXIDINE GLUCONATE CLOTH 2 % EX PADS
6.0000 | MEDICATED_PAD | Freq: Every day | CUTANEOUS | Status: DC
  Administered 2023-01-10 – 2023-01-30 (×20): 6 via TOPICAL

## 2023-01-10 MED ORDER — NOREPINEPHRINE 4 MG/250ML-% IV SOLN
INTRAVENOUS | Status: AC
  Filled 2023-01-10: qty 250

## 2023-01-10 MED ORDER — DIGOXIN 125 MCG PO TABS
0.1250 mg | ORAL_TABLET | Freq: Every day | ORAL | Status: DC
  Administered 2023-01-11 – 2023-01-13 (×3): 0.125 mg via ORAL
  Filled 2023-01-10 (×3): qty 1

## 2023-01-10 MED ORDER — VITAMIN D 25 MCG (1000 UNIT) PO TABS: 1000.0000 [IU] | ORAL_TABLET | Freq: Every day | ORAL | Status: DC

## 2023-01-10 MED ORDER — CALCIUM CARBONATE 1250 (500 CA) MG PO TABS
1.0000 | ORAL_TABLET | Freq: Two times a day (BID) | ORAL | Status: DC
  Administered 2023-01-10 – 2023-01-30 (×34): 1250 mg via ORAL
  Filled 2023-01-10 (×42): qty 1

## 2023-01-10 MED ORDER — FENTANYL CITRATE PF 50 MCG/ML IJ SOSY
PREFILLED_SYRINGE | INTRAMUSCULAR | Status: AC
  Filled 2023-01-10: qty 2

## 2023-01-10 MED ORDER — POLYETHYLENE GLYCOL 3350 17 G PO PACK
17.0000 g | PACK | Freq: Every day | ORAL | Status: DC
  Administered 2023-01-10: 17 g
  Filled 2023-01-10: qty 1

## 2023-01-10 MED ORDER — POLYETHYLENE GLYCOL 3350 17 G PO PACK
17.0000 g | PACK | Freq: Every day | ORAL | Status: DC
  Administered 2023-01-11 – 2023-01-16 (×4): 17 g via ORAL
  Filled 2023-01-10 (×4): qty 1

## 2023-01-10 MED ORDER — MIDODRINE HCL 5 MG PO TABS: 5.0000 mg | ORAL_TABLET | Freq: Two times a day (BID) | ORAL | Status: DC

## 2023-01-10 MED ORDER — AZITHROMYCIN 500 MG PO TABS
500.0000 mg | ORAL_TABLET | Freq: Every day | ORAL | Status: AC
  Administered 2023-01-11 – 2023-01-13 (×3): 500 mg via ORAL
  Filled 2023-01-10 (×4): qty 1

## 2023-01-10 MED ORDER — SENNOSIDES-DOCUSATE SODIUM 8.6-50 MG PO TABS: 1.0000 | ORAL_TABLET | Freq: Two times a day (BID) | ORAL | Status: DC

## 2023-01-10 MED ORDER — BUPROPION HCL ER (XL) 300 MG PO TB24
300.0000 mg | ORAL_TABLET | Freq: Every day | ORAL | Status: DC
  Administered 2023-01-10 – 2023-01-30 (×19): 300 mg via ORAL
  Filled 2023-01-10 (×21): qty 1

## 2023-01-10 MED ORDER — ROCURONIUM BROMIDE 10 MG/ML (PF) SYRINGE
PREFILLED_SYRINGE | INTRAVENOUS | Status: AC
  Filled 2023-01-10: qty 10

## 2023-01-10 MED ORDER — ALLOPURINOL 100 MG PO TABS
100.0000 mg | ORAL_TABLET | Freq: Every day | ORAL | Status: DC
  Administered 2023-01-11 – 2023-01-30 (×18): 100 mg via ORAL
  Filled 2023-01-10 (×20): qty 1

## 2023-01-10 MED ORDER — ASPIRIN 81 MG PO TBEC
81.0000 mg | DELAYED_RELEASE_TABLET | Freq: Every day | ORAL | Status: DC
  Administered 2023-01-10 – 2023-01-13 (×4): 81 mg via ORAL
  Filled 2023-01-10 (×4): qty 1

## 2023-01-10 MED ORDER — VITAMIN D 25 MCG (1000 UNIT) PO TABS
1000.0000 [IU] | ORAL_TABLET | Freq: Every day | ORAL | Status: DC
  Administered 2023-01-11 – 2023-01-30 (×18): 1000 [IU] via ORAL
  Filled 2023-01-10 (×18): qty 1

## 2023-01-10 MED ORDER — AZITHROMYCIN 500 MG PO TABS
500.0000 mg | ORAL_TABLET | Freq: Every day | ORAL | Status: DC
  Administered 2023-01-10: 500 mg
  Filled 2023-01-10: qty 1

## 2023-01-10 MED ORDER — MIDAZOLAM HCL 2 MG/2ML IJ SOLN
1.0000 mg | Freq: Once | INTRAMUSCULAR | Status: AC
  Administered 2023-01-10: 1 mg via INTRAVENOUS

## 2023-01-10 MED ORDER — ACETAZOLAMIDE 250 MG PO TABS
500.0000 mg | ORAL_TABLET | Freq: Once | ORAL | Status: AC
  Administered 2023-01-10: 500 mg via NASOGASTRIC
  Filled 2023-01-10: qty 2

## 2023-01-10 MED ORDER — ROSUVASTATIN CALCIUM 5 MG PO TABS: 5.0000 mg | ORAL_TABLET | Freq: Every day | ORAL | Status: DC

## 2023-01-10 MED ORDER — GUAIFENESIN 100 MG/5ML PO LIQD
10.0000 mL | Freq: Four times a day (QID) | ORAL | Status: DC
  Administered 2023-01-10: 10 mL
  Filled 2023-01-10 (×2): qty 10

## 2023-01-10 MED ORDER — SENNOSIDES-DOCUSATE SODIUM 8.6-50 MG PO TABS
1.0000 | ORAL_TABLET | Freq: Two times a day (BID) | ORAL | Status: DC
  Administered 2023-01-10 – 2023-01-18 (×11): 1 via ORAL
  Filled 2023-01-10 (×12): qty 1

## 2023-01-10 NOTE — Progress Notes (Signed)
Patient intubated for procedure Tolerated procedure well  Airway was explored and secretions was adequately cleared from airway  Will continue pulmonary toileting  Will attempt weaning

## 2023-01-10 NOTE — Procedures (Signed)
OG tube was placed uneventfully ?

## 2023-01-10 NOTE — Progress Notes (Signed)
   Heart Failure Stewardship Pharmacist Progress Note   PCP: Herma Carson, MD PCP-Cardiologist: None    HPI:  82 yo F with PMH of DVT/PE, CHF (Takotsubo cardiomyopathy), afib, and stroke.  Presented to the ED on 7/27 with weakness, shortness of breath, LE edema, AMS, and slurred speech. Tried increasing bumex as outpatient but did not see significant change to edema. CXR with moderate R pleural effusion, suspected HF and PNA. ECHO 7/28 showed LVEF 55%, no RWMA, mild LVH, RV mildly reduced, moderately elevated PA pressure, severe MR, severe TR. Has been on and off bipap. CXR 7/31 with complete opacification of R hemithorax, new since prior exam. Pulmonary consulted for thoracentesis. Underwent thoracentesis on 8/1. Tolerated the procedure well but then began developing chest discomfort after draining 1.1L. CXR still with fluid. CT scan with persistence of L right pleural effusion and some left sided effusion. Plan for intubation and bronchoscopy to clear atelectasis 8/2.    Current HF Medications: Diuretic: furosemide 80 mg IV BID Other: digoxin 0.125 mg daily *on midodrine 5 mg BID  Prior to admission HF Medications: Diuretic: bumex 2 mg daily Beta blocker: metoprolol tartrate 25 mg BID Other: digoxin 0.125 mg daily  Pertinent Lab Values: As of 8/1: Serum creatinine 0.74, BUN 19, Potassium 3.4, Sodium 134, BNP 563.3, Digoxin <0.2 (7/27)  Vital Signs: Weight: 192 lbs (admission weight: 194 lbs) Blood pressure: 100/80s  Heart rate: 110-120s  I/O: net -0.7L yesterday; net -0.5L since admission (not accurate)  Medication Assistance / Insurance Benefits Check: Does the patient have prescription insurance?  Yes Type of insurance plan: UHC Medicare  Does the patient qualify for medication assistance through manufacturers or grants?   Pending Eligible grants and/or patient assistance programs: pending Medication assistance applications in progress: none  Medication assistance  applications approved: none Approved medication assistance renewals will be completed by: pending  Outpatient Pharmacy:  Prior to admission outpatient pharmacy: [facility] Is the patient willing to use [facility] TOC pharmacy at discharge? Yes Is the patient willing to transition their outpatient pharmacy to utilize a [facility] outpatient pharmacy? No    Assessment: 1. Acute on chronic diastolic CHF (LVEF 55-60%), due to NICM. NYHA class III symptoms. - Continue furosemide 80 mg IV BID. Strict I/Os and daily weights. Labs pending. - Consider holding metoprolol if signs of low output. Currently warm on exam.  - Hold ACE/ARB/ARNI and MRA to allow for aggressive diuresis. Has been hypotensive requiring addition of midodrine. Reduced form 10>5 mg BID. - Consider adding SGLT2i prior to discharge pending mobility - Continue digoxin 0.125 mg daily, getting extra IV dose today for better rate control   Plan: 1) Medication changes recommended at this time: - Continue IV diuresis  2) Patient assistance: - Jardiance/Farxiga $47  3)  Education  - Patient has been educated on current HF medications and potential additions to HF medication regimen - Patient verbalizes understanding that over the next few months, these medication doses may change and more medications may be added to optimize HF regimen - Patient has been educated on basic disease state pathophysiology and goals of therapy   Sharen Hones, PharmD, BCPS Heart Failure Stewardship Pharmacist Phone [phone]

## 2023-01-10 NOTE — Procedures (Signed)
Intubation Procedure Note  [name]  [id number]  1941-02-23  Date:01/10/23  Time:11:23 AM   Provider Performing:Allye Hoyos A Rose Hegner    Procedure: Intubation (31500)  Indication(s) Respiratory Failure  Consent Risks of the procedure as well as the alternatives and risks of each were explained to the patient and/or caregiver.  Consent for the procedure was obtained and is signed in the bedside chart   Anesthesia Etomidate, Versed, Fentanyl, and Rocuronium   Time Out Verified patient identification, verified procedure, site/side was marked, verified correct patient position, special equipment/implants available, medications/allergies/relevant history reviewed, required imaging and test results available.   Sterile Technique Usual hand hygeine, masks, and gloves were used   Procedure Description Patient positioned in bed supine.  Sedation given as noted above.  Patient was intubated with endotracheal tube using Glidescope.  View was Grade 1 full glottis .  Number of attempts was 1.  Colorimetric CO2 detector was consistent with tracheal placement.   Complications/Tolerance None; patient tolerated the procedure well. Chest X-ray is ordered to verify placement.   EBL none   Specimen(s) None

## 2023-01-10 NOTE — Progress Notes (Signed)
   NAME:  [name], MRN:  [id number], DOB:  02-Feb-1941, LOS: 6 ADMISSION DATE:  01/04/2023, CONSULTATION DATE: 01/08/2023 REFERRING MD: Dr. Jomarie Longs, CHIEF COMPLAINT: Right pleural effusion  History of Present Illness:  Asked to see for possible thoracentesis   Patient with history of congestive heart failure, failing diuretics with chest x-ray showing whiteout of the right lung   She does have a mild cough, not really bringing up any secretions She did increase her Bumex at home but did not notice significant increase in diuresis She is requiring BiPAP on and off  Pertinent  Medical History   Past Medical History:  Diagnosis Date   Cardiac murmur    Echo, EF-55-60, mild tricuspid regurgitation   CVA (cerebral vascular accident) (HCC) 1991   DVT (deep venous thrombosis) (HCC)    PE (pulmonary embolism)    Takotsubo syndrome 08/2009   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   EF - 55-60, Tricuspid valve - mild-moderate regurgitation; 12/14/2009 - EF-55, mild to moderate tricuspid regurgitation     Significant Hospital Events: Including procedures, antibiotic start and stop dates in addition to other pertinent events   Chest x-ray 01/09/2023-persistent atelectasis right lung Ultrasound chest 01/08/2023-right effusion, consolidation CT scan of the chest 01/09/2023-complete atelectasis right lung, consolidation left base  Interim History / Subjective:  No overnight events Appears comfortable this morning Denies any pain or discomfort  Objective   Blood pressure 97/65, pulse (!) 124, temperature 97.6 F (36.4 C), temperature source Oral, resp. rate (!) 23, height 5\' 2"  (1.575 m), weight 87.2 kg, SpO2 98%.    FiO2 (%):  [70 %] 70 % PEEP:  [5 cmH20] 5 cmH20   Intake/Output Summary (Last 24 hours) at 01/10/2023 0749 Last data filed at 01/10/2023 0000 Gross per 24 hour  Intake 328.53 ml  Output 800 ml  Net -471.47 ml   Filed Weights   01/08/23 0500 01/09/23 0500 01/10/23 0300   Weight: 89 kg 87.6 kg 87.2 kg    Examination: General: Chronically ill-appearing HENT: Moist oral mucosa Lungs: Decreased air movement on the right, decreased at the bases on the left as well Cardiovascular: S1-S2 appreciated Abdomen: Soft, bowel sounds appreciated Extremities: Bilateral lower extremity edema Neuro: Alert and oriented x 3 GU:   Resolved Hospital Problem list     Assessment & Plan:   Acute on chronic respiratory failure with hypoxia and hypercapnia Diastolic heart failure  Right pleural effusion -Postthoracentesis for 1.1 L of fluid -CT scan still reveals significant atelectasis of right lung  Continues on diuretics  Mucomyst and Mucinex have not really helped the atelectasis  Possible pneumonia -On antibiotics  Pulmonary hypertension likely related to valvulopathy and heart failure  Paroxysmal atrial fibrillation -On metoprolol, digoxin  With persistence of the atelectasis, the only option we have will be a bronchoscopy which unfortunately has high risks This was discussed with patient, patient's daughter and granddaughter they are agreeable with proceeding with bronchoscopy There is a possibility that weaning will be very difficult because she is frail at baseline  Discussed with Dr. Andrey Cota who was also discussed the risks involved with the family and we are in agreement to proceed  Will transfer to the ICU, placed patient on the ventilator and attempt bronchoscopy with the hope of being able to clear the atelectasis  Virl Diamond, MD [place] [facility] Pager: See Loretha Stapler

## 2023-01-10 NOTE — Progress Notes (Addendum)
PROGRESS NOTE    [name]  BJY:[id number] DOB: [birth year] DOA: 01/04/2023 PCP: Herma Carson, MD  82 yo F with PMH of DVT/PE, CHF (Takotsubo cardiomyopathy), afib, and stroke presented to the ED on 7/27 with weakness, shortness of breath, LE edema, AMS, and slurred speech. Tried increasing bumex as outpatient but did not see significant change to edema. CXR with moderate R pleural effusion, suspected HF and PNA.  -Significant hypoxia on admission, poor response to diuretics, on antibiotics for pneumonia  -ECHO 7/28 showed LVEF 55%, no RWMA, mild LVH, RV mildly reduced, moderately elevated PA pressure, severe MR, severe TR.  -Poor response to diuretics, remains considerably hypoxic -8/1, thoracentesis 1.1 L removed, procedure aborted for pain  Subjective: -Patient seen and examined, discussed with granddaughter at bedside and called daughter Pam from her room, reports to be breathing better after thoracentesis  Assessment and Plan:  Acute on chronic respiratory failure with hypoxia and hypercapnia (HCC) Acute diastolic CHF, right pleural effusion Aspiration pneumonia, atelectasis -Day 5 of antibiotics and diuretics -Clinically does not appear volume overloaded at this time, will hold diuretics especially with drop in blood pressure to 80-90s -CT chest noted complete occlusion of right-sided bronchi due to large volume aspiration, complete collapse of right lower lobe, right middle lobe and near complete collapse of right upper lobe -After much discussion with patient family and pulmonary decision made to pursue therapeutic bronchoscopy for whiteout of right lung and resultant significant hypoxia  Acute on chronic diastolic CHF Severe mitral regurgitation -Prior history of Takotsubo cardiomyopathy -Echo this admit with EF 55%, mildly reduced RV, severe MR, moderately elevated PA pressures -Limited response to diuretics, thoracentesis completed yesterday -Will hold diuretics today  with drop in BP, appears that most of her respiratory issues and hypoxia is primarily related to right lung aspiration atelectasis and pleural effusion -Appreciate cards input  Paroxysmal atrial fibrillation with RVR (HCC) -now on IV amio, continue dig -Eliquis held yesterday for thoracentesis, plan to hold again for bronchoscopy  Left leg wound -left non-healing LE wound with purulent drainage and mild cellulitis. She was treated with a course of doxycycline about 3 weeks ago -Continue local wound care, likely worsened by edema from CHF  Hyperglycemia -A1c is 6.5, consistent with new diagnosis of diabetes  History of DVT/PE -Continue Eliquis  History of CVA -Continue Eliquis  DVT prophylaxis: eliquis, now on hold Code Status: Full Code Family Communication: Updated granddaughter at bedside and called daughter Elita Quick from the room Disposition Plan: May need rehab  Consultants:    Procedures:   Antimicrobials:    Objective: Vitals:   01/10/23 0756 01/10/23 0800 01/10/23 1000 01/10/23 1122  BP: (!) 130/98  131/84   Pulse: (!) 118 (!) 127    Resp: 20 (!) 22 (!) 22   Temp: 97.6 F (36.4 C)  98.3 F (36.8 C) 98.3 F (36.8 C)  TempSrc: Oral  Tympanic Oral  SpO2: 95% 95%    Weight:   85.8 kg   Height:        Intake/Output Summary (Last 24 hours) at 01/10/2023 1246 Last data filed at 01/10/2023 0756 Gross per 24 hour  Intake 328.53 ml  Output 900 ml  Net -571.47 ml   Filed Weights   01/09/23 0500 01/10/23 0300 01/10/23 1000  Weight: 87.6 kg 87.2 kg 85.8 kg    Examination:  General exam: Chronically ill female sitting up in bed, AAOx3 HEENT: no JVD CVS: S1-S2, regular rhythm, systolic murmur Lungs: Decreased breath sounds on  the right Abdomen: Soft, nontender, bowel sounds present Extremities: 1+ edema, left lower leg with dressing Psychiatry:  Mood & affect appropriate.     Data Reviewed:   CBC: Recent Labs  Lab 01/04/23 1250 01/04/23 1324  01/04/23 1341 01/04/23 1627 01/05/23 0113 01/09/23 0304 01/10/23 0146  WBC 6.8  --   --   --  8.0 8.5 9.7  NEUTROABS 4.8  --   --   --   --   --   --   HGB 12.7   < > 13.9 13.6 12.5 12.4 12.9  HCT 43.8   < > 41.0 40.0 42.1 40.7 42.3  MCV 101.4*  --   --   --  100.5* 97.6 95.1  PLT 286  --   --   --  258 195 220   < > = values in this interval not displayed.   Basic Metabolic Panel: Recent Labs  Lab 01/05/23 1429 01/07/23 1300 01/08/23 0324 01/09/23 0304 01/10/23 1007  NA 142 137 136 134* 137  K 3.5 3.4* 3.8 3.4* 3.7  CL 90* 88* 85* 86* 84*  CO2 38* 36* 37* 35* 41*  GLUCOSE 187* 187* 100* 91 130*  BUN 18 19 20 19 14   CREATININE 0.88 0.88 0.87 0.74 0.83  CALCIUM 9.6 8.9 9.1 8.9 9.3   GFR: Estimated Creatinine Clearance: 53.1 mL/min (by C-G formula based on SCr of 0.83 mg/dL). Liver Function Tests: Recent Labs  Lab 01/04/23 1250 01/10/23 1007  AST 47* 41  ALT 29 27  ALKPHOS 126 136*  BILITOT 1.2 0.9  PROT 6.6 6.6  ALBUMIN 3.1* 2.8*   No results for input(s): "LIPASE", "AMYLASE" in the last 168 hours. No results for input(s): "AMMONIA" in the last 168 hours. Coagulation Profile: Recent Labs  Lab 01/04/23 1250  INR 1.9*   Cardiac Enzymes: No results for input(s): "CKTOTAL", "CKMB", "CKMBINDEX", "TROPONINI" in the last 168 hours. BNP (last 3 results) No results for input(s): "PROBNP" in the last 8760 hours. HbA1C: Recent Labs    01/08/23 1154  HGBA1C 6.5*   CBG: Recent Labs  Lab 01/10/23 0958 01/10/23 1136  GLUCAP 109* 121*   Lipid Profile: No results for input(s): "CHOL", "HDL", "LDLCALC", "TRIG", "CHOLHDL", "LDLDIRECT" in the last 72 hours. Thyroid Function Tests: Recent Labs    01/08/23 1536  TSH 1.141   Anemia Panel: No results for input(s): "VITAMINB12", "FOLATE", "FERRITIN", "TIBC", "IRON", "RETICCTPCT" in the last 72 hours. Urine analysis:    Component Value Date/Time   COLORURINE AMBER (A) 01/04/2023 1250   APPEARANCEUR CLOUDY (A)  01/04/2023 1250   LABSPEC 1.023 01/04/2023 1250   PHURINE 7.0 01/04/2023 1250   GLUCOSEU NEGATIVE 01/04/2023 1250   HGBUR NEGATIVE 01/04/2023 1250   BILIRUBINUR NEGATIVE 01/04/2023 1250   KETONESUR NEGATIVE 01/04/2023 1250   PROTEINUR 100 (A) 01/04/2023 1250   UROBILINOGEN 1.0 10/02/2009 1418   NITRITE POSITIVE (A) 01/04/2023 1250   LEUKOCYTESUR LARGE (A) 01/04/2023 1250   Sepsis Labs: @LABRCNTIP (procalcitonin:4,lacticidven:4)  ) Recent Results (from the past 240 hour(s))  Culture, blood (Routine x 2)     Status: None   Collection Time: 01/04/23 12:50 PM   Specimen: BLOOD  Result Value Ref Range Status   Specimen Description BLOOD SITE NOT SPECIFIED  Final   Special Requests   Final    BOTTLES DRAWN AEROBIC AND ANAEROBIC Blood Culture results may not be optimal due to an inadequate volume of blood received in culture bottles   Culture   Final  NO GROWTH 5 DAYS Performed at [facility], [address], [place], [place] [address]    Report Status 01/09/2023 FINAL  Final  Urine Culture     Status: Abnormal   Collection Time: 01/04/23 12:50 PM   Specimen: Urine, Random  Result Value Ref Range Status   Specimen Description URINE, RANDOM  Final   Special Requests   Final    NONE Reflexed from [id number] Performed at [facility], [address], [place], [place] [address]    Culture MULTIPLE SPECIES PRESENT, SUGGEST RECOLLECTION (A)  Final   Report Status 01/06/2023 FINAL  Final  Culture, blood (Routine x 2)     Status: Abnormal   Collection Time: 01/04/23 12:55 PM   Specimen: BLOOD  Result Value Ref Range Status   Specimen Description BLOOD SITE NOT SPECIFIED  Final   Special Requests   Final    BOTTLES DRAWN AEROBIC AND ANAEROBIC Blood Culture results may not be optimal due to an inadequate volume of blood received in culture bottles   Culture  Setup Time   Final    GRAM POSITIVE RODS ANAEROBIC BOTTLE ONLY CRITICAL RESULT CALLED TO, READ BACK BY AND VERIFIED  WITH: PHARMD J. LEDFORD 01/05/23 @ 2133 BY AB    Culture (A)  Final    CORYNEBACTERIUM UREALYTICUM Standardized susceptibility testing for this organism is not available. Performed at [facility], [address], [place], [place] [address]    Report Status 01/08/2023 FINAL  Final  SARS Coronavirus 2 by RT PCR (hospital order, performed in [facility] hospital lab) *cepheid single result test* Anterior Nasal Swab     Status: None   Collection Time: 01/04/23  7:12 PM   Specimen: Anterior Nasal Swab  Result Value Ref Range Status   SARS Coronavirus 2 by RT PCR NEGATIVE NEGATIVE Final    Comment: Performed at [facility], [address] [place], [place] [address]  MRSA Next Gen by PCR, Nasal     Status: None   Collection Time: 01/05/23  1:40 AM   Specimen: Nasal Mucosa; Nasal Swab  Result Value Ref Range Status   MRSA by PCR Next Gen NOT DETECTED NOT DETECTED Final    Comment: (NOTE) The GeneXpert MRSA Assay (FDA approved for NASAL specimens only), is one component of a comprehensive MRSA colonization surveillance program. It is not intended to diagnose MRSA infection nor to guide or monitor treatment for MRSA infections. Test performance is not FDA approved in patients less than 7 years old. Performed at [facility], [address] [place], [place] [address]   Body fluid culture w Gram Stain     Status: None (Preliminary result)   Collection Time: 01/09/23 10:47 AM   Specimen: Pleural Fluid  Result Value Ref Range Status   Specimen Description PLEURAL  Final   Special Requests right lung  Final   Gram Stain   Final    FEW WBC PRESENT,BOTH PMN AND MONONUCLEAR NO ORGANISMS SEEN    Culture   Final    NO GROWTH < 24 HOURS Performed at [facility], [address] [place], [place] [address]    Report Status PENDING  Incomplete     Radiology Studies: DG CHEST PORT 1 VIEW  Result Date: 01/10/2023 CLINICAL DATA:  Hypoxia EXAM: PORTABLE CHEST 1 VIEW  COMPARISON:  01/09/2023 FINDINGS: Stable cardiomediastinal contours. There is a large right pleural effusion with significantly diminished aeration to the basilar  right upper lobe, right middle lobe and right base. This is unchanged from previous exam. Smaller left pleural effusion is also unchanged with atelectasis in volume loss from the left lower lobe. No new findings. IMPRESSION: No change in large right and small left pleural effusions with associated atelectasis and volume loss. Electronically Signed   By: Signa Kell M.D.   On: 01/10/2023 08:15   CT CHEST WO CONTRAST  Result Date: 01/09/2023 CLINICAL DATA:  Shortness of breath EXAM: CT CHEST WITHOUT CONTRAST TECHNIQUE: Multidetector CT imaging of the chest was performed following the standard protocol without IV contrast. RADIATION DOSE REDUCTION: This exam was performed according to the departmental dose-optimization program which includes automated exposure control, adjustment of the mA and/or kV according to patient size and/or use of iterative reconstruction technique. COMPARISON:  Chest CT dated May 22, 2022 FINDINGS: Cardiovascular: Cardiomegaly. Trace pericardial effusion. Normal caliber thoracic aorta with moderate calcified plaque. Mitral annular calcifications. Mild coronary artery calcifications. Mediastinum/Nodes: Esophagus is unremarkable. Enlarged multinodular right thyroid lobe, unchanged when compared with the prior exam. Lungs/Pleura: Complete occlusion of the right-sided bronchi. Complete collapse of the right lower lobe and right middle lobe. Large right and small left loculated pleural effusions. Near-complete collapse of the right upper lobe, aerated portion with diffuse ground-glass opacities. Mild patchy ground-glass opacities of the left hemithorax with small area of consolidation seen in the lingula on series 3, image 65. Upper Abdomen: Prior cholecystectomy.  No acute abnormality. Musculoskeletal: Soft tissue gas of the  posterior right chest wall, likely due to recent thoracentesis. No aggressive appearing osseous lesions. IMPRESSION: 1. Complete occlusion of the right-sided bronchi, likely due to large volume aspiration. 2. Complete collapse of the right lower lobe and right middle lobe. Near-complete collapse of the right upper lobe. 3. Diffuse ground-glass opacities of the aerated portion of the right upper lobe, likely due to re-expansion pulmonary edema given recent thoracentesis. 4. Large right and small left loculated pleural effusions. 5. Mild patchy ground-glass opacities of the left hemithorax with small area of consolidation seen in the lingula, likely infectious or inflammatory. 6. Aortic Atherosclerosis (ICD10-I70.0). Electronically Signed   By: Allegra Lai M.D.   On: 01/09/2023 15:02   DG CHEST PORT 1 VIEW  Result Date: 01/09/2023 CLINICAL DATA:  [id number] S/P thoracentesis [id number] EXAM: PORTABLE CHEST 1 VIEW COMPARISON:  Earlier the same day at 5:25 a.m. FINDINGS: Since the prior study, patient underwent presumed right-sided thoracentesis. There is interval decrease in the right pleural effusion with now aeration of the right upper lung zone. No pneumothorax. There is small left pleural effusion, grossly similar to the prior study. No pneumothorax. Rest of the findings are essentially unchanged. IMPRESSION: 1. Interval decrease in right pleural effusion. No pneumothorax. Electronically Signed   By: Jules Schick M.D.   On: 01/09/2023 11:22   DG CHEST PORT 1 VIEW  Result Date: 01/09/2023 CLINICAL DATA:  142230 Pleural effusion [id number] EXAM: PORTABLE CHEST - 1 VIEW COMPARISON:  01/08/2023, 01/04/2023 FINDINGS: The patient is rotated to the right. The mediastinum and cardiac silhouette are obscured, unchanged. Complete opacification of the right hemithorax. Similar appearing mild hazy opacities in the left lung base. The remaining left lung is clear. No acute osseous abnormality. IMPRESSION: 1. Complete  opacification of the right hemithorax, likely due to a combination of pleural effusion and airspace disease. 2. Similar appearing mild hazy opacities in the left lung base. Electronically Signed   By: Marliss Coots M.D.   On: 01/09/2023 08:09  Scheduled Meds:  acetylcysteine  4 mL Nebulization BID   allopurinol  100 mg Oral Daily   aspirin  81 mg Oral Daily   azithromycin  500 mg Oral Daily   buPROPion  300 mg Oral Daily   calcium carbonate  1 tablet Oral BID WC   Chlorhexidine Gluconate Cloth  6 each Topical Daily   cholecalciferol  1,000 Units Oral Daily   cyanocobalamin  1,000 mcg Oral Daily   digoxin  0.125 mg Intravenous Once   digoxin  0.125 mg Oral Daily   docusate  100 mg Per Tube BID   furosemide  80 mg Intravenous BID   gabapentin  400 mg Oral Daily   guaiFENesin  600 mg Oral BID   living well with diabetes book   Does not apply Once   midodrine  5 mg Oral BID WC   pantoprazole  40 mg Oral Daily   polyethylene glycol  17 g Per Tube Daily   rosuvastatin  5 mg Oral Daily   senna-docusate  1 tablet Oral BID   traMADol  50 mg Oral BID   Continuous Infusions:  sodium chloride     amiodarone 30 mg/hr (01/10/23 0236)   cefTRIAXone (ROCEPHIN)  IV 1 g (01/10/23 0841)   norepinephrine (LEVOPHED) Adult infusion 4 mcg/min (01/10/23 1053)   propofol (DIPRIVAN) infusion       LOS: 6 days    Time spent:    Zannie Cove, MD [facility]   01/10/2023, 12:46 PM

## 2023-01-10 NOTE — Progress Notes (Signed)
PT Cancellation Note  Patient Details Name: [name] MRN: [id number] DOB: 12/22/1940   Cancelled Treatment:    Reason Eval/Treat Not Completed: Patient not medically ready;Patient declined, no reason specified. Per RN hold PT for today due to respiratory needs. Per RN, pt was up to [facility] earlier this morning with assistance from staff. Pt declining any mobility at this time. PT will continue to f/u with pt acutely as available and appropriate.    [name] 01/10/2023, 8:45 AM

## 2023-01-10 NOTE — Procedures (Signed)
Extubation Procedure Note  Patient Details:   Name: [name] DOB: Feb 23, 1941 MRN: [id number]   Airway Documentation:    Vent end date: 01/10/23 Vent end time: 1513   Evaluation  O2 sats: stable throughout Complications: No apparent complications Patient did tolerate procedure well. Bilateral Breath Sounds: Diminished   Yes  Pt extubated per order to 10L HFNC (salter), bipap @ bedside if needed. Pt had a positive cuff leak, no stridor noted, good cough and able to state name. RT will monitor as needed.   Kerri Perches 01/10/2023, 3:14 PM

## 2023-01-10 NOTE — Progress Notes (Signed)
An USGPIV (ultrasound guided PIV) has been placed for short-term vasopressor infusion in the left forearm. A correctly placed ivWatch must be used when administering Vasopressors. Should this treatment be needed beyond 24 hours, central line access should be obtained.  It will be the responsibility of the bedside nurse to follow best practice to prevent extravasations.

## 2023-01-10 NOTE — Procedures (Signed)
Bronchoscopy Procedure Note  [name]  [id number]  August 05, 1940  Date:01/10/23  Time:11:27 AM   Provider Performing:[name]   Procedure(s):  Flexible Bronchoscopy [phone])  Indication(s) Complete atelectasis of right lung  Consent Risks of the procedure as well as the alternatives and risks of each were explained to the patient and/or caregiver.  Consent for the procedure was obtained and is signed in the bedside chart  Anesthesia Sedated   Time Out Verified patient identification, verified procedure, site/side was marked, verified correct patient position, special equipment/implants available, medications/allergies/relevant history reviewed, required imaging and test results available.   Sterile Technique Usual hand hygiene, masks, gowns, and gloves were used   Procedure Description Bronchoscope advanced through endotracheal tube and into airway.  Airways were examined down to subsegmental level with findings noted below.   Following diagnostic evaluation, washings of right lower lobe  Findings: Mucus impaction of right lung, thick mucoid secretions completely occluding the right mainstem.  Multiple aliquots of saline instilled, was able to aspirate almost all the secretions 4 cc of 20% Mucomyst and saline was instilled into the airway and effectively suction the airway     Complications/Tolerance None; patient tolerated the procedure well. Chest X-ray is needed post procedure.   EBL none   Specimen(s) Washings sent for analysis

## 2023-01-10 NOTE — Plan of Care (Signed)

## 2023-01-10 NOTE — Progress Notes (Signed)
Rounding Note    Patient Name: [name] Date of Encounter: 01/10/2023  [facility] Cardiologist:  [facility]   Subjective   No longer on Bipap, but anticipating bronch with intubation today per PCCM. Anxious but understands rationale. Discussed with family at bedside.   Inpatient Medications    Scheduled Meds:  acetylcysteine  4 mL Nebulization BID   allopurinol  100 mg Oral Daily   aspirin  81 mg Oral Daily   azithromycin  500 mg Oral Daily   buPROPion  300 mg Oral Daily   calcium carbonate  1 tablet Oral BID WC   cholecalciferol  1,000 Units Oral Daily   cyanocobalamin  1,000 mcg Oral Daily   digoxin  0.125 mg Oral Daily   furosemide  80 mg Intravenous BID   gabapentin  400 mg Oral Daily   guaiFENesin  600 mg Oral BID   living well with diabetes book   Does not apply Once   midodrine  5 mg Oral BID WC   pantoprazole  40 mg Oral Daily   rosuvastatin  5 mg Oral Daily   senna-docusate  1 tablet Oral BID   traMADol  50 mg Oral BID   Continuous Infusions:  amiodarone 30 mg/hr (01/10/23 0236)   cefTRIAXone (ROCEPHIN)  IV 1 g (01/10/23 0841)   PRN Meds: ipratropium-albuterol   Vital Signs    Vitals:   01/10/23 0300 01/10/23 0447 01/10/23 0756 01/10/23 0800  BP: 97/65  (!) 130/98   Pulse: (!) 124 (!) 124 (!) 118 (!) 127  Resp: (!) 22 (!) 23 20 (!) 22  Temp: 97.6 F (36.4 C)  97.6 F (36.4 C)   TempSrc: Oral  Oral   SpO2: 90% 98% 95% 95%  Weight: 87.2 kg     Height:        Intake/Output Summary (Last 24 hours) at 01/10/2023 0912 Last data filed at 01/10/2023 0756 Gross per 24 hour  Intake 328.53 ml  Output 900 ml  Net -571.47 ml      01/10/2023    3:00 AM 01/09/2023    5:00 AM 01/08/2023    5:00 AM  Last 3 Weights  Weight (lbs) 192 lb 3.9 oz 193 lb 2 oz 196 lb 3.4 oz  Weight (kg) 87.2 kg 87.6 kg 89 kg      Telemetry    Atrial fibrillation heart rates 120s- Personally Reviewed  ECG    No new tracings- Personally Reviewed  Physical Exam    GEN: No acute distress.   Neck: mild JVD Cardiac: Irregularly irregular, tachycardic Respiratory: Coarse, diminished/distant lung sounds GI: Soft, nontender, non-distended  MS: 1-2+ pitting edema.  Neuro:  Nonfocal  Psych: Normal affect   Labs    High Sensitivity Troponin:   Recent Labs  Lab 01/04/23 1250 01/04/23 1717  TROPONINIHS 24* 24*     Chemistry Recent Labs  Lab 01/04/23 1250 01/04/23 1324 01/07/23 1300 01/08/23 0324 01/09/23 0304  NA 141   < > 137 136 134*  K 5.1   < > 3.4* 3.8 3.4*  CL 93*   < > 88* 85* 86*  CO2 33*   < > 36* 37* 35*  GLUCOSE 152*   < > 187* 100* 91  BUN 23   < > 19 20 19   CREATININE 0.81   < > 0.88 0.87 0.74  CALCIUM 10.3   < > 8.9 9.1 8.9  PROT 6.6  --   --   --   --  ALBUMIN 3.1*  --   --   --   --   AST 47*  --   --   --   --   ALT 29  --   --   --   --   ALKPHOS 126  --   --   --   --   BILITOT 1.2  --   --   --   --   GFRNONAA >60   < > >60 >60 >60  ANIONGAP 15   < > 13 14 13    < > = values in this interval not displayed.    Lipids No results for input(s): "CHOL", "TRIG", "HDL", "LABVLDL", "LDLCALC", "CHOLHDL" in the last 168 hours.  Hematology Recent Labs  Lab 01/05/23 0113 01/09/23 0304 01/10/23 0146  WBC 8.0 8.5 9.7  RBC 4.19 4.17 4.45  HGB 12.5 12.4 12.9  HCT 42.1 40.7 42.3  MCV 100.5* 97.6 95.1  MCH 29.8 29.7 29.0  MCHC 29.7* 30.5 30.5  RDW 14.3 14.4 14.6  PLT 258 195 220   Thyroid  Recent Labs  Lab 01/08/23 1536  TSH 1.141    BNP Recent Labs  Lab 01/04/23 1250  BNP 563.3*    DDimer No results for input(s): "DDIMER" in the last 168 hours.   Radiology    DG CHEST PORT 1 VIEW  Result Date: 01/10/2023 CLINICAL DATA:  Hypoxia EXAM: PORTABLE CHEST 1 VIEW COMPARISON:  01/09/2023 FINDINGS: Stable cardiomediastinal contours. There is a large right pleural effusion with significantly diminished aeration to the basilar right upper lobe, right middle lobe and right base. This is unchanged from previous exam.  Smaller left pleural effusion is also unchanged with atelectasis in volume loss from the left lower lobe. No new findings. IMPRESSION: No change in large right and small left pleural effusions with associated atelectasis and volume loss. Electronically Signed   By: Signa Kell M.D.   On: 01/10/2023 08:15   CT CHEST WO CONTRAST  Result Date: 01/09/2023 CLINICAL DATA:  Shortness of breath EXAM: CT CHEST WITHOUT CONTRAST TECHNIQUE: Multidetector CT imaging of the chest was performed following the standard protocol without IV contrast. RADIATION DOSE REDUCTION: This exam was performed according to the departmental dose-optimization program which includes automated exposure control, adjustment of the mA and/or kV according to patient size and/or use of iterative reconstruction technique. COMPARISON:  Chest CT dated May 22, 2022 FINDINGS: Cardiovascular: Cardiomegaly. Trace pericardial effusion. Normal caliber thoracic aorta with moderate calcified plaque. Mitral annular calcifications. Mild coronary artery calcifications. Mediastinum/Nodes: Esophagus is unremarkable. Enlarged multinodular right thyroid lobe, unchanged when compared with the prior exam. Lungs/Pleura: Complete occlusion of the right-sided bronchi. Complete collapse of the right lower lobe and right middle lobe. Large right and small left loculated pleural effusions. Near-complete collapse of the right upper lobe, aerated portion with diffuse ground-glass opacities. Mild patchy ground-glass opacities of the left hemithorax with small area of consolidation seen in the lingula on series 3, image 65. Upper Abdomen: Prior cholecystectomy.  No acute abnormality. Musculoskeletal: Soft tissue gas of the posterior right chest wall, likely due to recent thoracentesis. No aggressive appearing osseous lesions. IMPRESSION: 1. Complete occlusion of the right-sided bronchi, likely due to large volume aspiration. 2. Complete collapse of the right lower lobe and  right middle lobe. Near-complete collapse of the right upper lobe. 3. Diffuse ground-glass opacities of the aerated portion of the right upper lobe, likely due to re-expansion pulmonary edema given recent thoracentesis. 4. Large right and small left  loculated pleural effusions. 5. Mild patchy ground-glass opacities of the left hemithorax with small area of consolidation seen in the lingula, likely infectious or inflammatory. 6. Aortic Atherosclerosis (ICD10-I70.0). Electronically Signed   By: Allegra Lai M.D.   On: 01/09/2023 15:02   DG CHEST PORT 1 VIEW  Result Date: 01/09/2023 CLINICAL DATA:  [id number] S/P thoracentesis [id number] EXAM: PORTABLE CHEST 1 VIEW COMPARISON:  Earlier the same day at 5:25 a.m. FINDINGS: Since the prior study, patient underwent presumed right-sided thoracentesis. There is interval decrease in the right pleural effusion with now aeration of the right upper lung zone. No pneumothorax. There is small left pleural effusion, grossly similar to the prior study. No pneumothorax. Rest of the findings are essentially unchanged. IMPRESSION: 1. Interval decrease in right pleural effusion. No pneumothorax. Electronically Signed   By: Jules Schick M.D.   On: 01/09/2023 11:22   DG CHEST PORT 1 VIEW  Result Date: 01/09/2023 CLINICAL DATA:  [id number] Pleural effusion [id number] EXAM: PORTABLE CHEST - 1 VIEW COMPARISON:  01/08/2023, 01/04/2023 FINDINGS: The patient is rotated to the right. The mediastinum and cardiac silhouette are obscured, unchanged. Complete opacification of the right hemithorax. Similar appearing mild hazy opacities in the left lung base. The remaining left lung is clear. No acute osseous abnormality. IMPRESSION: 1. Complete opacification of the right hemithorax, likely due to a combination of pleural effusion and airspace disease. 2. Similar appearing mild hazy opacities in the left lung base. Electronically Signed   By: Marliss Coots M.D.   On: 01/09/2023 08:09    Cardiac Studies    Echocardiogram 01/05/2023  1. Left ventricular ejection fraction, by estimation, is 55% with beat to  beat variability. The left ventricle has normal function. The left  ventricle has no regional wall motion abnormalities. There is mild left  ventricular hypertrophy. Left  ventricular diastolic parameters are indeterminate.   2. Right ventricular systolic function is mildly reduced. The right  ventricular size is moderately enlarged. There is moderately elevated  pulmonary artery systolic pressure. The estimated right ventricular  systolic pressure is 50.5 mmHg.   3. Left atrial size was severely dilated.   4. Right atrial size was severely dilated.   5. The mitral valve is degenerative. Severe mitral valve regurgitation.  No evidence of mitral stenosis. Severe mitral annular calcification.   6. 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   7. The aortic valve is tricuspid. There is mild calcification of the  aortic valve. Aortic valve regurgitation is mild. No aortic stenosis is  present.   8. The inferior vena cava is dilated in size with <50% respiratory  variability, suggesting right atrial pressure of 15 mmHg.   Patient Profile     82 y.o. female  with a hx of Takotsubo cardiomyopathy 2011, mod-severe TR, paroxysmal atrial fibrillation, CVA 1991, DVT/PE, chronic hypoxic respiratory failure on 2 L, hyperlipidemia, bilateral carotid artery stenosis.  She was admitted on 01/08/2023 for evaluation of HFpEF exacerbation/newly diagnosed severe MR.  Assessment & Plan    Acute on chronic HFpEF with moderate right pleural effusion Patient presenting with worsening signs of orthopnea/shortness of breath for the last 2 weeks.  Failed outpatient titration of Bumex.  Echocardiogram on admission showed preserved EF of 55% with mild reduction in RV function.  RVSP 50.  Her blood pressure has remained persistently low and requiring midodrine 10 mg twice daily.  Continue IV Lasix 80 mg twice daily.    Continue midodrine 10 mg twice daily, digoxin 0.125 mg On amiodarone  for rate control, would consider trialing adding back her low dose BB for rate control.   Paroxysmal atrial fibrillation Presumed to have been maintaining normal sinus rhythm since at least 2022 ZIO monitor showed no afib, no documented occurrences till now.  Rates decently controlled in the 90s.  Arrhythmia likely driven by heart failure exacerbation/possible pneumonia.  Continue to treat underlying causes.  If she remains to be in atrial fibrillation can plan for DCCV outpatient.  Resume Eliquis 5 mg twice daily after thoracentesis. Continue IV amio given hypotension and further rate control. Continue digoxin 0.125 mg.  Will dose digoxin 0.125 mg IV bolus for better rate control x 1. TSH normal    Acute on chronic respiratory failure with hypoxia and hypercapnia - on bipap initially, but needs bronch today per pccm with intubation.    Severe TR/MR Chronically has had moderate to severe TR.  TR velocity 2.98.  Mean mitral gradient 3.  Now has new onset of MR.  Suspects this may be attributed due to hypervolemia.  Will need to repeat echocardiogram to evaluate once sufficiently diuresed.     Hyperlipidemia Continue rosuvastatin 5 mg   History of DVT/PE History of CVA Cellulitis CAP Per primary team  For questions or updates, please contact [facility] Please consult [email or web address] for contact info under        Signed, Parke Poisson, MD  01/10/2023, 9:12 AM

## 2023-01-10 NOTE — Progress Notes (Signed)
Saw patient at bedside  She is awake alert and interactive Comfortable Decision made to wean to see whether she is able to extubate  She was tolerating BiPAP well prior to a bronchoscopy  If she is able to wean well with CPAP pressure support, will plan to extubate  Updated family at bedside

## 2023-01-10 NOTE — Progress Notes (Signed)
   01/10/23 2130  Therapy Vitals  Pulse Rate (!) 118  Resp (!) 28  BP (!) 103/50  Patient Position (if appropriate) Sitting  MEWS Score/Color  MEWS Score 4  MEWS Score Color Red  Respiratory Assessment  Assessment Type Assess only  Respiratory Pattern Regular;Unlabored  Chest Assessment Chest expansion symmetrical  Bilateral Breath Sounds Diminished  R Upper  Breath Sounds Diminished  L Upper Breath Sounds Clear  R Lower Breath Sounds Diminished  L Lower Breath Sounds Diminished  Oxygen Therapy/Pulse Ox  O2 Device HFNC  O2 Therapy Oxygen humidified  Heater temperature 93.2 F (34 C)  O2 Flow Rate (L/min) (S)  40 L/min  FiO2 (%) (S)  85 %  SpO2 92 %   Called by RN for diminished R side. R side lungs notably diminished more than earlier when previously assessed by RT. Pt required an increase of set flow rate to 40 L/min and FiO2 from 60 to 85% on High flow to maintain sats of 92%

## 2023-01-11 ENCOUNTER — Encounter (HOSPITAL_COMMUNITY): Payer: Self-pay | Admitting: Pulmonary Disease

## 2023-01-11 DIAGNOSIS — J9622 Acute and chronic respiratory failure with hypercapnia: Secondary | ICD-10-CM | POA: Diagnosis not present

## 2023-01-11 DIAGNOSIS — J9621 Acute and chronic respiratory failure with hypoxia: Secondary | ICD-10-CM | POA: Diagnosis not present

## 2023-01-11 MED ORDER — PNEUMOCOCCAL 20-VAL CONJ VACC 0.5 ML IM SUSY
0.5000 mL | PREFILLED_SYRINGE | INTRAMUSCULAR | Status: DC
  Filled 2023-01-11: qty 0.5

## 2023-01-11 MED ORDER — POTASSIUM CHLORIDE CRYS ER 20 MEQ PO TBCR
20.0000 meq | EXTENDED_RELEASE_TABLET | Freq: Once | ORAL | Status: AC
  Administered 2023-01-11: 20 meq via ORAL
  Filled 2023-01-11: qty 1

## 2023-01-11 MED ORDER — SALINE SPRAY 0.65 % NA SOLN: 1.0000 | NASAL | Status: DC | PRN

## 2023-01-11 MED ORDER — BUMETANIDE 1 MG PO TABS
1.0000 mg | ORAL_TABLET | Freq: Every day | ORAL | Status: DC
  Administered 2023-01-11 – 2023-01-13 (×3): 1 mg via ORAL
  Filled 2023-01-11 (×3): qty 1

## 2023-01-11 MED ORDER — FLUTICASONE PROPIONATE 50 MCG/ACT NA SUSP
2.0000 | Freq: Every day | NASAL | Status: DC
  Administered 2023-01-11 – 2023-01-29 (×7): 2 via NASAL
  Filled 2023-01-11: qty 16

## 2023-01-11 MED ORDER — AMIODARONE LOAD VIA INFUSION
150.0000 mg | Freq: Once | INTRAVENOUS | Status: AC
  Administered 2023-01-11: 150 mg via INTRAVENOUS
  Filled 2023-01-11: qty 83.34

## 2023-01-11 MED ORDER — ORAL CARE MOUTH RINSE: 15.0000 mL | OROMUCOSAL | Status: DC | PRN

## 2023-01-11 NOTE — Evaluation (Signed)
Physical Therapy Evaluation Patient Details Name: [name] MRN: [id number] DOB: 1941/05/13 Today's Date: 01/11/2023  History of Present Illness  TASHEA OTHMAN is a 82 y.o. female who presents with weakness, labored respiration and increased altered mental status on 7/27. Pt found to have R pleural effusion, s/p thorencentisis, PMH: Takotsubo cardiomyopathy, CVA, DVT/PE, ventricular fibrillation, chronic hypoxemic respiratory failure on 2L PRN , L LE non-healing wound/cellulitis   Clinical Impression  Pt admitted with above. Pt with noted SOB with activity however very motivated to mobilize and return to independence and home. Pt currently on 20% FIO2 via HFNC. Pt limited to step pvt transfers to [facility] this date but anticipate she will progress well. Acute PT to cont to follow.        If plan is discharge home, recommend the following: A little help with walking and/or transfers;A little help with bathing/dressing/bathroom;Assist for transportation;Help with stairs or ramp for entrance   Can travel by private vehicle        Equipment Recommendations  (has RW)  Recommendations for Other Services  OT consult    Functional Status Assessment Patient has had a recent decline in their functional status and demonstrates the ability to make significant improvements in function in a reasonable and predictable amount of time.     Precautions / Restrictions Precautions Precautions: Fall Precaution Comments: watch SpO2 Restrictions Weight Bearing Restrictions: No      Mobility  Bed Mobility               General bed mobility comments: pt received sitting up in chair    Transfers Overall transfer level: Needs assistance Equipment used: Rolling walker (2 wheels) Transfers: Sit to/from Stand, Bed to chair/wheelchair/BSC Sit to Stand: Min assist   Step pivot transfers: Min assist       General transfer comment: increased time, pt with good power up using arm rests of chair,  SpO2 at 88%, returned to sitting, then stood and transferred to [facility] from chair with RW and minA for walker managment, increased time, noted DOE    Ambulation/Gait               General Gait Details: limited by having to have BM and HF o2 machine  Stairs            Wheelchair Mobility     Tilt Bed    Modified Rankin (Stroke Patients Only)       Balance Overall balance assessment: Needs assistance Sitting-balance support: Feet supported, Bilateral upper extremity supported Sitting balance-Leahy Scale: Fair     Standing balance support: Bilateral upper extremity supported, During functional activity, Reliant on assistive device for balance Standing balance-Leahy Scale: Poor Standing balance comment: dependent on RW                             Pertinent Vitals/Pain Pain Assessment Pain Assessment: No/denies pain    Home Living Family/patient expects to be discharged to:: Private residence Living Arrangements: Children (Son) Available Help at Discharge: Family;Available 24 hours/day Type of Home: House Home Access: Stairs to enter Entrance Stairs-Rails: Can reach both Entrance Stairs-Number of Steps: 4   Home Layout: One level Home Equipment: Agricultural consultant (2 wheels);Cane - single point;Shower seat;Grab bars - tub/shower;Hand held shower head      Prior Function Prior Level of Function : Independent/Modified Independent             Mobility Comments: used cane occassionally  ADLs Comments: was indep and cooking     Hand Dominance   Dominant Hand: Right    Extremity/Trunk Assessment   Upper Extremity Assessment Upper Extremity Assessment: Generalized weakness    Lower Extremity Assessment Lower Extremity Assessment: Generalized weakness    Cervical / Trunk Assessment Cervical / Trunk Assessment: Normal  Communication   Communication: No difficulties  Cognition Arousal/Alertness: Awake/alert Behavior During Therapy: WFL for  tasks assessed/performed Overall Cognitive Status: Within Functional Limits for tasks assessed                                          General Comments General comments (skin integrity, edema, etc.): Pt on 20% FIO2 via HFNC, SPO2 dropped to 88% with activity but quickly recovered to 92%    Exercises     Assessment/Plan    PT Assessment Patient needs continued PT services  PT Problem List Decreased activity tolerance;Decreased balance;Decreased mobility;Decreased coordination;Decreased cognition;Cardiopulmonary status limiting activity       PT Treatment Interventions DME instruction;Gait training;Stair training;Functional mobility training;Therapeutic activities;Therapeutic exercise;Balance training;Neuromuscular re-education    PT Goals (Current goals can be found in the Care Plan section)  Acute Rehab PT Goals Patient Stated Goal: get better PT Goal Formulation: With patient Time For Goal Achievement: 01/25/23 Potential to Achieve Goals: Good    Frequency Min 1X/week     Co-evaluation               AM-PAC PT "6 Clicks" Mobility  Outcome Measure Help needed turning from your back to your side while in a flat bed without using bedrails?: A Lot Help needed moving from lying on your back to sitting on the side of a flat bed without using bedrails?: A Lot Help needed moving to and from a bed to a chair (including a wheelchair)?: A Little Help needed standing up from a chair using your arms (e.g., wheelchair or bedside chair)?: A Little Help needed to walk in hospital room?: A Little Help needed climbing 3-5 steps with a railing? : A Lot 6 Click Score: 15    End of Session Equipment Utilized During Treatment: Oxygen Activity Tolerance: Patient tolerated treatment well Patient left:  (on BSC with call light) Nurse Communication: Mobility status (pt left on BSC) PT Visit Diagnosis: Unsteadiness on feet (R26.81);Muscle weakness (generalized)  (M62.81);Difficulty in walking, not elsewhere classified (R26.2)    Time: 1610-9604 PT Time Calculation (min) (ACUTE ONLY): 19 min   Charges:   PT Evaluation $PT Eval Moderate Complexity: 1 Mod   PT General Charges $$ ACUTE PT VISIT: 1 Visit         Lewis Shock, PT, DPT Acute Rehabilitation Services Secure chat preferred Office #: [phone]   Iona Hansen 01/11/2023, 2:26 PM

## 2023-01-11 NOTE — Progress Notes (Signed)
eLink Physician-Brief Progress Note Patient Name: [name] DOB: 1940/10/17 MRN: [id number]   Date of Service  01/11/2023  HPI/Events of Note  Increased O2 requirement on HHFNC FIO2 100% 40L  Improving some. CXR with moderate right effusion  eICU Interventions  Wean O2 as tolerated for goal >88% Add flutter valve to bed CPT If respiratory status worsens consider BiPAP trial before intubation     Intervention Category Major Interventions: Hypoxemia - evaluation and management  [name] 01/11/2023, 3:55 AM

## 2023-01-11 NOTE — Progress Notes (Addendum)
NAME:  ZYAIR RHEIN, MRN:  [id number], DOB:  11-24-1940, LOS: 7 ADMISSION DATE:  01/04/2023, CONSULTATION DATE: 01/08/2023 REFERRING MD: Dr. Jomarie Longs, CHIEF COMPLAINT: Right pleural effusion  History of Present Illness:  44 F, history of A-fib, DVT/PE, Takotsubo cardiomyopathy with associated systolic CHF, CVA with residual deficits.  Admitted with weakness, dyspnea, lower extremity edema and encephalopathy on 7/27. Has been treated for volume overload but only moderate response to diuretics.  Right lower lobe infiltrate and severe volume loss consistent with pneumonia/atelectasis with coexisting right pleural effusion.  Underwent right thoracentesis with 1.1 L removed 8/1.  Then bronchoscopy on 8/2 for secretion clearance to hopefully resolve some of her right lower lobe and right middle lobe atelectasis.  She was intubated for that procedure, able to be extubated afterwards on 8/2.  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  Extubated postprocedure  Interim History / Subjective:   High flow nasal cannula 40  L/min, 100%.  Did not wear BiPAP overnight  Objective   Blood pressure 105/66, pulse (!) 130, temperature 98.2 F (36.8 C), temperature source Oral, resp. rate (!) 27, height 5\' 2"  (1.575 m), weight 88.3 kg, SpO2 100%.    Vent Mode: CPAP;PSV FiO2 (%):  [40 %-100 %] 100 % Set Rate:  [16 bmp-18 bmp] 18 bmp Vt Set:  [400 mL] 400 mL PEEP:  [5 cmH20] 5 cmH20 Pressure Support:  [10 cmH20] 10 cmH20 Plateau Pressure:  [24 cmH20] 24 cmH20   Intake/Output Summary (Last 24 hours) at 01/11/2023 4098 Last data filed at 01/11/2023 0600 Gross per 24 hour  Intake 888.24 ml  Output 1100 ml  Net -211.76 ml   Filed Weights   01/10/23 0300 01/10/23 1000 01/11/23 0339  Weight: 87.2 kg 85.8 kg 88.3 kg    Examination: General: Chronically ill-appearing elderly woman sitting up in bed with high flow nasal cannula in place eating breakfast HENT: Strong voice, no oral secretions, no stridor Lungs: Small breaths bilaterally, more clear on the left.  Bronchial breath sounds are decreased significantly on the right Cardiovascular: Irregularly irregular, 110, no murmur Abdomen: Obese, nondistended with positive bowel sounds Extremities: No lower extremity edema on the right, left lower extremity is wrapped Neuro: Awake, alert, answers questions and interacts appropriately.  Follows commands. GU: Deferred  Resolved Hospital Problem list     Assessment & Plan:   Acute on chronic hypoxemic respiratory failure due to systolic and diastolic CHF, right lower lobe and right middle lobe collapse and presumed pneumonia, right pleural effusion  Acute on chronic hypoxemic respiratory failure -Wean FiO2 as able -Aggressively push pulmonary hygiene, flutter, I-S, Mucomyst, Mucinex -May need to continue nocturnal BiPAP, although this may impair secretion  clearance  Right pleural effusion, thoracentesis 8/1.  Labs suggest a transudate although serum LDH was not obtained (protein <3) -Could consider repeat  thoracentesis although she had discomfort during last procedure -Would favor to reinitiate slow steady diuresis as effusion is at least in part due to her overall volume status  Diastolic and systolic CHF -Start back on half of her home dose Bumex as she does appear to be euvolemic.  Bumex 1 mg daily -Follow volume status closely, -234 cc for the hospitalization  Right pneumonia -Remains on ceftriaxone, azithromycin -Still with significant atelectasis, push pulmonary hygiene.  Completed bronchoscopy as above -Follow culture information post bronchoscopy  Paroxysmal atrial fibrillation -Metoprolol held -Amiodarone -Digoxin -Eliquis  Disposition: Plan to keep her in the ICU for now given her O2 needs, atrial fibrillation  Discussed plans and status with the patient and daughter at bedside on 8/3  Critical care time 32 minutes  Levy Pupa, MD, PhD 01/11/2023, 8:47 AM [facility] [phone] or if no answer before 7:00PM call [phone] For any issues after 7:00PM please call eLink [phone]

## 2023-01-11 NOTE — Progress Notes (Signed)
RT added flutter valve to treatment per MD Everardo All.  RT instructed patient on how to use the flutter valve and she verbalized understanding. RT will continue to monitor.

## 2023-01-11 NOTE — Progress Notes (Signed)
Rounding Note    Patient Name: [name] Date of Encounter: 01/11/2023  [facility] Cardiologist:  [facility]   Subjective   Sitting up in chair alert with optiflow in place, sats 92%  Inpatient Medications    Scheduled Meds:  acetylcysteine  4 mL Nebulization BID   allopurinol  100 mg Oral Daily   apixaban  5 mg Oral BID   aspirin EC  81 mg Oral Daily   azithromycin  500 mg Oral Daily   buPROPion  300 mg Oral Daily   calcium carbonate  1 tablet Oral BID WC   Chlorhexidine Gluconate Cloth  6 each Topical Daily   cholecalciferol  1,000 Units Oral Daily   cyanocobalamin  1,000 mcg Oral Daily   digoxin  0.125 mg Oral Daily   docusate sodium  100 mg Oral Daily   fluticasone  2 spray Each Nare Daily   gabapentin  400 mg Oral Daily   guaiFENesin  600 mg Oral BID   guaiFENesin  10 mL Oral Q6H   living well with diabetes book   Does not apply Once   midodrine  5 mg Oral BID WC   pantoprazole  40 mg Oral QHS   polyethylene glycol  17 g Oral Daily   potassium chloride  20 mEq Oral Once   rosuvastatin  5 mg Oral Daily   senna-docusate  1 tablet Oral BID   traMADol  50 mg Oral BID   Continuous Infusions:  sodium chloride     amiodarone 30 mg/hr (01/11/23 0600)   cefTRIAXone (ROCEPHIN)  IV 1 g (01/11/23 0730)   PRN Meds: ipratropium-albuterol, mouth rinse, sodium chloride   Vital Signs    Vitals:   01/11/23 0600 01/11/23 0751 01/11/23 0830 01/11/23 0843  BP: 105/66     Pulse: (!) 130  (!) 122 (!) 125  Resp: (!) 27  (!) 24   Temp:  98.2 F (36.8 C)    TempSrc:  Oral    SpO2: 100%  97%   Weight:      Height:        Intake/Output Summary (Last 24 hours) at 01/11/2023 0847 Last data filed at 01/11/2023 0600 Gross per 24 hour  Intake 888.24 ml  Output 1100 ml  Net -211.76 ml      01/11/2023    3:39 AM 01/10/2023   10:00 AM 01/10/2023    3:00 AM  Last 3 Weights  Weight (lbs) 194 lb 10.7 oz 189 lb 2.5 oz 192 lb 3.9 oz  Weight (kg) 88.3 kg 85.8 kg 87.2 kg       Telemetry    Atrial fibrillation heart rates 120s- Personally Reviewed  ECG    No new tracings- Personally Reviewed  Physical Exam   GEN: No acute distress.   Neck: mild JVD Cardiac: Irregularly irregular, tachycardic Respiratory: Coarse, diminished/distant lung sounds GI: Soft, nontender, non-distended  MS: 1-2+ pitting edema, wrapped.  Neuro:  Nonfocal  Psych: Normal affect   Labs    High Sensitivity Troponin:   Recent Labs  Lab 01/04/23 1250 01/04/23 1717  TROPONINIHS 24* 24*     Chemistry Recent Labs  Lab 01/04/23 1250 01/04/23 1324 01/09/23 0304 01/10/23 1007 01/10/23 1310 01/11/23 0314  NA 141   < > 134* 137 138 137  K 5.1   < > 3.4* 3.7 3.4* 3.7  CL 93*   < > 86* 84*  --  88*  CO2 33*   < > 35* 41*  --  36*  GLUCOSE 152*   < > 91 130*  --  94  BUN 23   < > 19 14  --  13  CREATININE 0.81   < > 0.74 0.83  --  0.71  CALCIUM 10.3   < > 8.9 9.3  --  9.2  PROT 6.6  --   --  6.6  --   --   ALBUMIN 3.1*  --   --  2.8*  --   --   AST 47*  --   --  41  --   --   ALT 29  --   --  27  --   --   ALKPHOS 126  --   --  136*  --   --   BILITOT 1.2  --   --  0.9  --   --   GFRNONAA >60   < > >60 >60  --  >60  ANIONGAP 15   < > 13 12  --  13   < > = values in this interval not displayed.    Lipids No results for input(s): "CHOL", "TRIG", "HDL", "LABVLDL", "LDLCALC", "CHOLHDL" in the last 168 hours.  Hematology Recent Labs  Lab 01/09/23 0304 01/10/23 0146 01/10/23 1310 01/11/23 0314  WBC 8.5 9.7  --  8.4  RBC 4.17 4.45  --  4.34  HGB 12.4 12.9 14.6 12.6  HCT 40.7 42.3 43.0 41.7  MCV 97.6 95.1  --  96.1  MCH 29.7 29.0  --  29.0  MCHC 30.5 30.5  --  30.2  RDW 14.4 14.6  --  14.7  PLT 195 220  --  195   Thyroid  Recent Labs  Lab 01/08/23 1536  TSH 1.141    BNP Recent Labs  Lab 01/04/23 1250  BNP 563.3*    DDimer No results for input(s): "DDIMER" in the last 168 hours.   Radiology    DG CHEST PORT 1 VIEW  Result Date:  01/10/2023 CLINICAL DATA:  [id number] Respiratory failure (HCC) 66501 EXAM: PORTABLE CHEST 1 VIEW COMPARISON:  Chest x-ray 01/10/2023 11:51 a.m. FINDINGS: Interval removal of endotracheal and enteric tubes. The heart and mediastinal contours are within normal limits. Persistent low lung volumes with at least moderate volume right and small left pleural effusions. Associated underlying airspace opacities. Mild pulmonary edema. No pneumothorax. No acute osseous abnormality. IMPRESSION: 1. Persistent low lung volumes with at least moderate volume right and small left pleural effusions. 2. Mild pulmonary edema. 3. Interval removal endotracheal and enteric tubes. Electronically Signed   By: Tish Frederickson M.D.   On: 01/10/2023 23:17   DG CHEST PORT 1 VIEW  Result Date: 01/10/2023 CLINICAL DATA:  Orogastric tube placement.  Intubation. EXAM: PORTABLE CHEST 1 VIEW COMPARISON:  Same day. FINDINGS: Endotracheal tube is in grossly good position. Nasogastric tube is seen entering stomach. IMPRESSION: Endotracheal and nasogastric tubes in grossly good position. Electronically Signed   By: Lupita Raider M.D.   On: 01/10/2023 13:10   DG Abd Portable 1V  Result Date: 01/10/2023 CLINICAL DATA:  NG tube placement. EXAM: PORTABLE ABDOMEN - 1 VIEW COMPARISON:  03/23/2015 FINDINGS: NG tube tip is in the mid stomach. Proximal side port projects at or just below the GE junction. Tube could be advanced another 3-4 cm to ensure that the side port is below the GE junction. Visualized portion the abdomen shows nonspecific gas pattern. Bones are demineralized with fusion hardware noted in the lumbosacral region. IMPRESSION: NG  tube tip is in the mid stomach with proximal side port at or just below the GE junction. Tube could be advanced another 3-4 cm to ensure that the side port is below the GE junction. Electronically Signed   By: Kennith Center M.D.   On: 01/10/2023 13:00   DG CHEST PORT 1 VIEW  Result Date: 01/10/2023 CLINICAL DATA:   Hypoxia EXAM: PORTABLE CHEST 1 VIEW COMPARISON:  01/09/2023 FINDINGS: Stable cardiomediastinal contours. There is a large right pleural effusion with significantly diminished aeration to the basilar right upper lobe, right middle lobe and right base. This is unchanged from previous exam. Smaller left pleural effusion is also unchanged with atelectasis in volume loss from the left lower lobe. No new findings. IMPRESSION: No change in large right and small left pleural effusions with associated atelectasis and volume loss. Electronically Signed   By: Signa Kell M.D.   On: 01/10/2023 08:15   CT CHEST WO CONTRAST  Result Date: 01/09/2023 CLINICAL DATA:  Shortness of breath EXAM: CT CHEST WITHOUT CONTRAST TECHNIQUE: Multidetector CT imaging of the chest was performed following the standard protocol without IV contrast. RADIATION DOSE REDUCTION: This exam was performed according to the departmental dose-optimization program which includes automated exposure control, adjustment of the mA and/or kV according to patient size and/or use of iterative reconstruction technique. COMPARISON:  Chest CT dated May 22, 2022 FINDINGS: Cardiovascular: Cardiomegaly. Trace pericardial effusion. Normal caliber thoracic aorta with moderate calcified plaque. Mitral annular calcifications. Mild coronary artery calcifications. Mediastinum/Nodes: Esophagus is unremarkable. Enlarged multinodular right thyroid lobe, unchanged when compared with the prior exam. Lungs/Pleura: Complete occlusion of the right-sided bronchi. Complete collapse of the right lower lobe and right middle lobe. Large right and small left loculated pleural effusions. Near-complete collapse of the right upper lobe, aerated portion with diffuse ground-glass opacities. Mild patchy ground-glass opacities of the left hemithorax with small area of consolidation seen in the lingula on series 3, image 65. Upper Abdomen: Prior cholecystectomy.  No acute abnormality.  Musculoskeletal: Soft tissue gas of the posterior right chest wall, likely due to recent thoracentesis. No aggressive appearing osseous lesions. IMPRESSION: 1. Complete occlusion of the right-sided bronchi, likely due to large volume aspiration. 2. Complete collapse of the right lower lobe and right middle lobe. Near-complete collapse of the right upper lobe. 3. Diffuse ground-glass opacities of the aerated portion of the right upper lobe, likely due to re-expansion pulmonary edema given recent thoracentesis. 4. Large right and small left loculated pleural effusions. 5. Mild patchy ground-glass opacities of the left hemithorax with small area of consolidation seen in the lingula, likely infectious or inflammatory. 6. Aortic Atherosclerosis (ICD10-I70.0). Electronically Signed   By: Allegra Lai M.D.   On: 01/09/2023 15:02   DG CHEST PORT 1 VIEW  Result Date: 01/09/2023 CLINICAL DATA:  [id number] S/P thoracentesis [id number] EXAM: PORTABLE CHEST 1 VIEW COMPARISON:  Earlier the same day at 5:25 a.m. FINDINGS: Since the prior study, patient underwent presumed right-sided thoracentesis. There is interval decrease in the right pleural effusion with now aeration of the right upper lung zone. No pneumothorax. There is small left pleural effusion, grossly similar to the prior study. No pneumothorax. Rest of the findings are essentially unchanged. IMPRESSION: 1. Interval decrease in right pleural effusion. No pneumothorax. Electronically Signed   By: Jules Schick M.D.   On: 01/09/2023 11:22    Cardiac Studies   Echocardiogram 01/05/2023  1. Left ventricular ejection fraction, by estimation, is 55% with beat to  beat variability.  The left ventricle has normal function. The left  ventricle has no regional wall motion abnormalities. There is mild left  ventricular hypertrophy. Left  ventricular diastolic parameters are indeterminate.   2. Right ventricular systolic function is mildly reduced. The right  ventricular  size is moderately enlarged. There is moderately elevated  pulmonary artery systolic pressure. The estimated right ventricular  systolic pressure is 50.5 mmHg.   3. Left atrial size was severely dilated.   4. Right atrial size was severely dilated.   5. The mitral valve is degenerative. Severe mitral valve regurgitation.  No evidence of mitral stenosis. Severe mitral annular calcification.   6. 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   7. The aortic valve is tricuspid. There is mild calcification of the  aortic valve. Aortic valve regurgitation is mild. No aortic stenosis is  present.   8. The inferior vena cava is dilated in size with <50% respiratory  variability, suggesting right atrial pressure of 15 mmHg.   Patient Profile     82 y.o. female  with a hx of Takotsubo cardiomyopathy 2011, mod-severe TR, paroxysmal atrial fibrillation, CVA 1991, DVT/PE, chronic hypoxic respiratory failure on 2 L, hyperlipidemia, bilateral carotid artery stenosis.  She was admitted on 01/08/2023 for evaluation of HFpEF exacerbation/newly diagnosed severe MR.  Assessment & Plan    Acute on chronic HFpEF with moderate right pleural effusion Patient presenting with worsening signs of orthopnea/shortness of breath for the last 2 weeks.  Failed outpatient titration of Bumex.  Echocardiogram on admission showed preserved EF of 55% with mild reduction in RV function.  RVSP 50.  Her blood pressure has remained persistently low and requiring midodrine 10 mg twice daily.  Was on IV Lasix 80 mg twice daily, has bee held. If BP would redose lasix and monitor output Continue midodrine 10 mg twice daily, digoxin 0.125 mg On amiodarone for rate control  Paroxysmal atrial fibrillation Presumed to have been maintaining normal sinus rhythm since at least 2022 ZIO monitor showed no afib, no documented occurrences till now.  Rates decently controlled in the 90s.  Arrhythmia likely driven by heart failure  exacerbation/possible pneumonia.  Continue to treat underlying causes.  If she remains to be in atrial fibrillation can plan for DCCV outpatient.  Resume Eliquis 5 mg twice daily after procedures.  Continue IV amio given hypotension and further rate control. Continue digoxin 0.125 mg.  Attempted digoxin IV bolus with no significant impact. Rates may improve as pulmonary status improves, and with continued volume management.  TSH normal    Acute on chronic respiratory failure with hypoxia and hypercapnia - bronch on 01/10/23   Severe TR/MR Chronically has had moderate to severe TR.  TR velocity 2.98.  Mean mitral gradient 3.  Now has new onset of MR.  Suspects this may be attributed due to hypervolemia.  Will need to repeat echocardiogram to evaluate once sufficiently diuresed.     Hyperlipidemia Continue rosuvastatin 5 mg   History of DVT/PE History of CVA Cellulitis CAP Per primary team  For questions or updates, please contact [facility] Please consult [email or web address] for contact info under        Signed, Parke Poisson, MD  01/11/2023, 8:47 AM

## 2023-01-11 NOTE — Progress Notes (Signed)
RT called to assess patient respiratory status.  Patient O2 saturation was 88% upon entering room.  Patient stated she was in no distress.  CXR was completed and this RT and RN spoke with [facility] RN and the fiO2 was increased to 100% and stay the course with the flow since patient was in no distress.      01/11/23 0005  Oxygen Therapy/Pulse Ox  O2 Device HFNC  Heater temperature 93.2 F (34 C)  O2 Flow Rate (L/min) 40 L/min  FiO2 (%) (S)  100 %

## 2023-01-12 ENCOUNTER — Inpatient Hospital Stay (HOSPITAL_COMMUNITY): Payer: Medicare Other

## 2023-01-12 DIAGNOSIS — J9622 Acute and chronic respiratory failure with hypercapnia: Secondary | ICD-10-CM | POA: Diagnosis not present

## 2023-01-12 DIAGNOSIS — J9621 Acute and chronic respiratory failure with hypoxia: Secondary | ICD-10-CM | POA: Diagnosis not present

## 2023-01-12 LAB — BASIC METABOLIC PANEL WITH GFR
Anion gap: 13 (ref 5–15)
BUN: 11 mg/dL (ref 8–23)
CO2: 32 mmol/L (ref 22–32)
Calcium: 9.6 mg/dL (ref 8.9–10.3)
Chloride: 88 mmol/L — ABNORMAL LOW (ref 98–111)
Creatinine, Ser: 0.65 mg/dL (ref 0.44–1.00)
GFR, Estimated: >60 mL/min (ref >60)
Glucose, Bld: 112 mg/dL — ABNORMAL HIGH (ref 70–99)
Potassium: 3.5 mmol/L (ref 3.5–5.1)
Sodium: 133 mmol/L — ABNORMAL LOW (ref 135–145)

## 2023-01-12 LAB — CBC
HCT: 40 % (ref 36.0–46.0)
Hemoglobin: 12.5 g/dL (ref 12.0–15.0)
MCH: 29.1 pg (ref 26.0–34.0)
MCHC: 31.3 g/dL (ref 30.0–36.0)
MCV: 93 fL (ref 80.0–100.0)
Platelets: 192 10*3/uL (ref 150–400)
RBC: 4.3 MIL/uL (ref 3.87–5.11)
RDW: 14.8 % (ref 11.5–15.5)
WBC: 10.1 10*3/uL (ref 4.0–10.5)
nRBC: 0 % (ref 0.0–0.2)

## 2023-01-12 LAB — PHOSPHORUS: Phosphorus: 2.7 mg/dL (ref 2.5–4.6)

## 2023-01-12 LAB — MAGNESIUM: Magnesium: 1.9 mg/dL (ref 1.7–2.4)

## 2023-01-12 MED ORDER — AMIODARONE HCL IN DEXTROSE 360-4.14 MG/200ML-% IV SOLN
60.0000 mg/h | INTRAVENOUS | Status: AC
  Administered 2023-01-12: 60 mg/h via INTRAVENOUS

## 2023-01-12 MED ORDER — METOPROLOL TARTRATE 12.5 MG HALF TABLET
12.5000 mg | ORAL_TABLET | Freq: Two times a day (BID) | ORAL | Status: DC
  Administered 2023-01-12 – 2023-01-13 (×3): 12.5 mg via ORAL
  Filled 2023-01-12 (×3): qty 1

## 2023-01-12 MED ORDER — POTASSIUM CHLORIDE CRYS ER 20 MEQ PO TBCR
40.0000 meq | EXTENDED_RELEASE_TABLET | Freq: Once | ORAL | Status: AC
  Administered 2023-01-12: 40 meq via ORAL
  Filled 2023-01-12: qty 2

## 2023-01-12 MED ORDER — AMIODARONE HCL IN DEXTROSE 360-4.14 MG/200ML-% IV SOLN
30.0000 mg/h | INTRAVENOUS | Status: DC
  Administered 2023-01-12 – 2023-01-18 (×14): 30 mg/h via INTRAVENOUS
  Filled 2023-01-12 (×15): qty 200

## 2023-01-12 MED ORDER — MAGNESIUM SULFATE 2 GM/50ML IV SOLN
2.0000 g | Freq: Once | INTRAVENOUS | Status: AC
  Administered 2023-01-12: 2 g via INTRAVENOUS
  Filled 2023-01-12: qty 50

## 2023-01-12 NOTE — Plan of Care (Signed)

## 2023-01-12 NOTE — Progress Notes (Signed)
NAME:  [name], MRN:  [id number], DOB:  1941/05/27, LOS: 8 ADMISSION DATE:  01/04/2023, CONSULTATION DATE: 01/08/2023 REFERRING MD: Dr. Jomarie Longs, CHIEF COMPLAINT: Right pleural effusion  History of Present Illness:  54 F, history of A-fib, DVT/PE, Takotsubo cardiomyopathy with associated systolic CHF, CVA with residual deficits.  Admitted with weakness, dyspnea, lower extremity edema and encephalopathy on 7/27. Has been treated for volume overload but only moderate response to diuretics.  Right lower lobe infiltrate and severe volume loss consistent with pneumonia/atelectasis with coexisting right pleural effusion.  Underwent right thoracentesis with 1.1 L removed 8/1.  Then bronchoscopy on 8/2 for secretion clearance to hopefully resolve some of her right lower lobe and right middle lobe atelectasis.  She was intubated for that procedure, able to be extubated afterwards on 8/2.  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  Extubated postprocedure  Interim History / Subjective:  Has continued to be in atrial  fibrillation with RVR, amiodarone increased to 60 overnight, currently 110-120s Weaning high flow nasal cannula: 30 L/min, 0.60 I/O- 700 cc total    Objective   Blood pressure 119/74, pulse (!) 133, temperature 98 F (36.7 C), temperature source Axillary, resp. rate (!) 28, height 5\' 2"  (1.575 m), weight 86.3 kg, SpO2 91%.    FiO2 (%):  [50 %-100 %] 60 %   Intake/Output Summary (Last 24 hours) at 01/12/2023 0802 Last data filed at 01/12/2023 0700 Gross per 24 hour  Intake 1077.52 ml  Output 1650 ml  Net -572.48 ml   Filed Weights   01/10/23 1000 01/11/23 0339 01/12/23 0414  Weight: 85.8 kg 88.3 kg 86.3 kg    Examination: General: Chronically ill-appearing woman sitting up in bed eating breakfast HENT: No secretions, oropharynx clear, no stridor, strong voice Lungs: Small breaths bilaterally, clear on the left, decreased with bronchial breath sounds on the right Cardiovascular: Irregularly irregular 110-120, no murmur Abdomen: Obese, nondistended with positive bowel sounds Extremities: Left lower extremity is wrapped, no edema on the right Neuro: Awake, alert, appropriate, moves extremities, follows commands.  Answers questions GU: Deferred  Resolved Hospital Problem list     Assessment & Plan:   Acute on chronic hypoxemic respiratory failure due to systolic and diastolic CHF, right lower lobe and right middle lobe collapse and presumed pneumonia, right pleural effusion  Acute on chronic hypoxemic respiratory failure -Continue to push pulmonary hygiene aggressively: Flutter valve, I-S, Mucomyst, Mucinex -Wean FiO2 as able -Did not wear BiPAP overnight but this may be beneficial with regard to her residual lower lobe atelectasis  R PNA Right pleural effusion, thoracentesis 8/1.  Labs suggest a transudate although serum LDH was not obtained (protein <3) -Continue slow steady  diuresis as she can tolerate. -Unclear whether there would be any significant benefit to repeat right  thoracentesis but could consider -Push pulmonary hygiene -Complete full course ceftriaxone and azithromycin -Follow culture data  Diastolic and systolic CHF -Seems to tolerate her restart at half home dose Bumex on 8/3 although she has been more tachycardic.  Goal increased to her home 2 mg when hemodynamics will allow it -Continue to follow renal function, volume status closely  Paroxysmal atrial fibrillation, still with RVR -Metoprolol held, plan to restart low-dose on 8/4 -Amiodarone increased again to 60 overnight, plan transition to 30 in 6 hours -Continue digoxin -Continue Eliquis  Disposition: Plan to keep her in the ICU for now given her O2 needs, atrial fibrillation  Discussed plans and status with the patient and daughter at bedside on 8/4  Critical care time 33 so the metoprolol is what I am thinking about minutes  Levy Pupa, MD, PhD 01/12/2023, 8:02 AM [facility] [phone] or if no answer before 7:00PM call [phone] For any issues after 7:00PM please call eLink [phone]

## 2023-01-12 NOTE — Progress Notes (Signed)
[facility] ADULT ICU REPLACEMENT PROTOCOL   The patient does apply for the [facility] Adult ICU Electrolyte Replacment Protocol based on the criteria listed below:   1.Exclusion criteria: TCTS, ECMO, Dialysis, and Myasthenia Gravis patients 2. Is GFR >/= 30 ml/min? Yes.    Patient's GFR today is >60 3. Is SCr </= 2? Yes.   Patient's SCr is 0.65 mg/dL 4. Did SCr increase >/= 0.5 in 24 hours? No. 5.Pt's weight >40kg  Yes.   6. Abnormal electrolyte(s): K, Mag  7. Electrolytes replaced per protocol 8.  Call MD STAT for K+ </= 2.5, Phos </= 1, or Mag </= 1 Physician:  Arnette Schaumann [facility] 01/12/2023 3:04 AM

## 2023-01-12 NOTE — Progress Notes (Signed)
eLink Physician-Brief Progress Note Patient Name: [name] DOB: Jan 29, 1941 MRN: [id number]   Date of Service  01/12/2023  HPI/Events of Note  Contacted for AFRVR 120-140s HDS with SBP 100s Respiratory status driving AFRVR. Slightly improved FIO2 to 50% 40L I/O demonstrate good UOP with bumex  eICU Interventions  Increase amio gtt back to 60 mg/h x 6h followed by 30 mg/h     Intervention Category Intermediate Interventions: Arrhythmia - evaluation and management  Hina Gupta Mechele Collin 01/12/2023, 1:32 AM

## 2023-01-13 ENCOUNTER — Inpatient Hospital Stay (HOSPITAL_COMMUNITY): Payer: Medicare Other

## 2023-01-13 DIAGNOSIS — T17908A Unspecified foreign body in respiratory tract, part unspecified causing other injury, initial encounter: Secondary | ICD-10-CM

## 2023-01-13 DIAGNOSIS — L8990 Pressure ulcer of unspecified site, unspecified stage: Secondary | ICD-10-CM | POA: Insufficient documentation

## 2023-01-13 DIAGNOSIS — J9621 Acute and chronic respiratory failure with hypoxia: Secondary | ICD-10-CM | POA: Diagnosis not present

## 2023-01-13 DIAGNOSIS — J9601 Acute respiratory failure with hypoxia: Principal | ICD-10-CM

## 2023-01-13 DIAGNOSIS — J9602 Acute respiratory failure with hypercapnia: Secondary | ICD-10-CM

## 2023-01-13 DIAGNOSIS — J9622 Acute and chronic respiratory failure with hypercapnia: Secondary | ICD-10-CM | POA: Diagnosis not present

## 2023-01-13 LAB — POCT I-STAT 7, (LYTES, BLD GAS, ICA,H+H)
Acid-Base Excess: 8 mmol/L — ABNORMAL HIGH (ref 0.0–2.0)
Acid-Base Excess: 9 mmol/L — ABNORMAL HIGH (ref 0.0–2.0)
Acid-Base Excess: 11 mmol/L — ABNORMAL HIGH (ref 0.0–2.0)
Bicarbonate: 37.7 mmol/L — ABNORMAL HIGH (ref 20.0–28.0)
Bicarbonate: 38.2 mmol/L — ABNORMAL HIGH (ref 20.0–28.0)
Bicarbonate: 40.5 mmol/L — ABNORMAL HIGH (ref 20.0–28.0)
Calcium, Ion: 1.33 mmol/L (ref 1.15–1.40)
Calcium, Ion: 1.28 mmol/L (ref 1.15–1.40)
Calcium, Ion: 1.29 mmol/L (ref 1.15–1.40)
HCT: 43 % (ref 36.0–46.0)
HCT: 41 % (ref 36.0–46.0)
HCT: 40 % (ref 36.0–46.0)
Hemoglobin: 14.6 g/dL (ref 12.0–15.0)
Hemoglobin: 13.9 g/dL (ref 12.0–15.0)
Hemoglobin: 13.6 g/dL (ref 12.0–15.0)
O2 Saturation: 96 %
O2 Saturation: 99 %
O2 Saturation: 99 %
Patient temperature: 98.1
Patient temperature: 97.9
Potassium: 4.2 mmol/L (ref 3.5–5.1)
Potassium: 4.4 mmol/L (ref 3.5–5.1)
Potassium: 4.1 mmol/L (ref 3.5–5.1)
Sodium: 133 mmol/L — ABNORMAL LOW (ref 135–145)
Sodium: 132 mmol/L — ABNORMAL LOW (ref 135–145)
Sodium: 134 mmol/L — ABNORMAL LOW (ref 135–145)
TCO2: 40 mmol/L — ABNORMAL HIGH (ref 22–32)
TCO2: 40 mmol/L — ABNORMAL HIGH (ref 22–32)
TCO2: 43 mmol/L — ABNORMAL HIGH (ref 22–32)
pCO2 arterial: 79.5 mmHg (ref 32–48)
pCO2 arterial: 71 mmHg (ref 32–48)
pCO2 arterial: 79.3 mmHg (ref 32–48)
pH, Arterial: 7.283 — ABNORMAL LOW (ref 7.35–7.45)
pH, Arterial: 7.338 — ABNORMAL LOW (ref 7.35–7.45)
pH, Arterial: 7.316 — ABNORMAL LOW (ref 7.35–7.45)
pO2, Arterial: 97 mmHg (ref 83–108)
pO2, Arterial: 160 mmHg — ABNORMAL HIGH (ref 83–108)
pO2, Arterial: 151 mmHg — ABNORMAL HIGH (ref 83–108)

## 2023-01-13 LAB — BASIC METABOLIC PANEL
Anion gap: 12 (ref 5–15)
BUN: 10 mg/dL (ref 8–23)
CO2: 32 mmol/L (ref 22–32)
Calcium: 9.3 mg/dL (ref 8.9–10.3)
Chloride: 89 mmol/L — ABNORMAL LOW (ref 98–111)
Creatinine, Ser: 0.66 mg/dL (ref 0.44–1.00)
GFR, Estimated: >60 mL/min (ref >60)
Glucose, Bld: 153 mg/dL — ABNORMAL HIGH (ref 70–99)
Potassium: 4 mmol/L (ref 3.5–5.1)
Sodium: 133 mmol/L — ABNORMAL LOW (ref 135–145)

## 2023-01-13 LAB — GLUCOSE, CAPILLARY
Glucose-Capillary: 114 mg/dL — ABNORMAL HIGH (ref 70–99)
Glucose-Capillary: 78 mg/dL (ref 70–99)

## 2023-01-13 MED ORDER — ADULT MULTIVITAMIN W/MINERALS CH
1.0000 | ORAL_TABLET | Freq: Every day | ORAL | Status: DC
  Administered 2023-01-13 – 2023-01-30 (×16): 1 via ORAL
  Filled 2023-01-13 (×16): qty 1

## 2023-01-13 MED ORDER — METOPROLOL TARTRATE 5 MG/5ML IV SOLN: 2.5000 mg | INTRAVENOUS | Status: DC | PRN

## 2023-01-13 MED ORDER — ORAL CARE MOUTH RINSE: 15.0000 mL | OROMUCOSAL | Status: DC | PRN

## 2023-01-13 MED ORDER — ONDANSETRON HCL 4 MG/2ML IJ SOLN
4.0000 mg | Freq: Three times a day (TID) | INTRAMUSCULAR | Status: DC | PRN
  Administered 2023-01-13 – 2023-01-26 (×7): 4 mg via INTRAVENOUS
  Filled 2023-01-13 (×7): qty 2

## 2023-01-13 MED ORDER — ORAL CARE MOUTH RINSE
15.0000 mL | OROMUCOSAL | Status: DC
  Administered 2023-01-14 – 2023-01-21 (×28): 15 mL via OROMUCOSAL

## 2023-01-13 MED ORDER — PANTOPRAZOLE SODIUM 40 MG IV SOLR
40.0000 mg | Freq: Every day | INTRAVENOUS | Status: AC
  Administered 2023-01-13 – 2023-01-15 (×3): 40 mg via INTRAVENOUS
  Filled 2023-01-13 (×3): qty 10

## 2023-01-13 MED ORDER — JUVEN PO PACK: 1.0000 | PACK | Freq: Two times a day (BID) | ORAL | Status: DC

## 2023-01-13 MED ORDER — BUMETANIDE 2 MG PO TABS
2.0000 mg | ORAL_TABLET | Freq: Two times a day (BID) | ORAL | Status: DC
  Filled 2023-01-13: qty 1

## 2023-01-13 MED ORDER — ONDANSETRON 4 MG PO TBDP
4.0000 mg | ORAL_TABLET | Freq: Three times a day (TID) | ORAL | Status: DC | PRN
  Administered 2023-01-13: 4 mg via ORAL
  Filled 2023-01-13 (×2): qty 1

## 2023-01-13 NOTE — Progress Notes (Signed)
I reviewed CXR -- improved aeration of the RUW on BiPAP. Working on weaning FiO2-- sats are ok but question if they are accurate or if this is just shunt. RT to obtain ABG & work on weaning FiO2  I updated Ms. Zapf's daughter and son on the unit. We reviewed my concerns from earlier-- that if she is requiring repeat bronchs of management of airway secretions, this is likely not sustainable without tracheostomy long term, and they are not sure she would want that. Inability to manage secretions is associated with a very poor prognosis long-term. We discussed that there is no risk-free path forward for her and they need to determine what she would want if she fails to improve or she worsens again. No change in code status currently, but I recommended a repeat discussion tomorrow morning to revisit goals and code status moving forward.   My cc time today: 32 min.  Steffanie Dunn, DO 01/13/23 5:52 PM [facility]  For contact information, see Amion. If no response to pager, please call PCCM consult pager. After hours, 7PM- 7AM, please call Elink.

## 2023-01-13 NOTE — Progress Notes (Addendum)
Called to bedside by RN due to concern for aspiration. This patient had lunch and afterwards was more short of breath with increasing O2 needs.  On my evaluation she denies choking on food or drink. She reports more shortness of breath than she had this morning.  BP 111/72   Pulse (!) 123   Temp 98.4 F (36.9 C) (Oral)   Resp 18   Ht 5\' 2"  (1.575 m)   Wt 85.4 kg   SpO2 92%   BMI 34.44 kg/m   She is intermittently hypoxic to low 80s on exam.  Her breathing is labored with tachypnea. Very little air movement appreciated on right side, some wheezing over left anterior lung field. Skin is warm and dry.  CXR shows whiteout of the right thorax. EKG with a-fib stable from last study.  Worsening pleural effusion versus atelectasis versus aspiration into right mainstem. Started BiPAP with improvement in symptoms. Nebulized albuterol/atrovent.     Attending note: Went  to bedside with resident due to desuration episode while or shortly after eating. She was on bipap saturating in the mid-90s when I arrived. Minimal scalene accessory muscle use. No breath sounds on the R.  CXR personally reviewed> entire R hemithorax white out. Previously lucent RUL no longer aerated.  Previously pleural fluid studies exudative. More fluid than on any previous CXR this admission.  Concern for aspiration vs rapidly reaccumulating effusion. Anticiapte she will require a thoracentesis this afternoon. Will assess effusion at bedside with [place].  Unfortunately she is on eliquis. She is full code.  Strict NPO. Con't BiPAP for now. Cancel transfer to progressive care.   Steffanie Dunn, DO 01/13/23 2:49 PM [facility]  For contact information, see Amion. If no response to pager, please call PCCM consult pager. After hours, 7PM- 7AM, please call Elink.   BP (!) 76/55   Pulse (!) 109   Temp 98.4 F (36.9 C) (Oral)   Resp (!) 32   Ht 5\' 2"  (1.575 m)   Wt 85.4 kg   SpO2 100%   BMI 34.44  kg/m  No longer using accessory muscles. Awake, alert.   Bedside US: surprisingly minimal fluid, no available dependent fluid pocket to drain fluid. Son arrived at bedside during [place]. We discussed my concerns regarding her respiratory decline and no apparently rapidly reversible cause. With such small fluid pockets and her on AC, thoracentesis would be high risk. My recommendation is con't BiPAP for 2 hrs, repeat CXR. If it is not improved, she likely will require intubation for bronchoscopy. Her son is not [facility] POA; his sister is, who will be coming to visit this afternoon. We discussed my concern that with such high O2 requirements, we are likely to have significant desaturations during intubation, increasing the risk of peri-intubation arrest.   Steffanie Dunn, DO 01/13/23 3:14 PM [place] [facility]

## 2023-01-13 NOTE — Progress Notes (Signed)
NAME:  [name], MRN:  [id number], DOB:  07/17/1940, LOS: 9 ADMISSION DATE:  01/04/2023, CONSULTATION DATE: 01/08/2023 REFERRING MD: Dr. Jomarie Longs, CHIEF COMPLAINT: Right pleural effusion  History of Present Illness:  82 F, history of A-fib, DVT/PE, Takotsubo cardiomyopathy with associated systolic CHF, CVA with residual deficits.  Admitted with weakness, dyspnea, lower extremity edema and encephalopathy on 7/27. Has been treated for volume overload but only moderate response to diuretics.  Right lower lobe infiltrate and severe volume loss consistent with pneumonia/atelectasis with coexisting right pleural effusion.  Underwent right thoracentesis with 1.1 L removed 8/1.  Then bronchoscopy on 8/2 for secretion clearance to hopefully resolve some of her right lower lobe and right middle lobe atelectasis.  She was intubated for that procedure, able to be extubated afterwards on 8/2.  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  Extubated postprocedure  Interim History / Subjective:  Continues to state that she  feels a bit better, less dyspneic FiO2 and high flow rate have been weaned I/O  -1200 cc total (added back Bumex) Labs pending this morning Still in atrial fibrillation,, HR 110s   Objective   Blood pressure 106/62, pulse (!) 115, temperature 97.7 F (36.5 C), temperature source Axillary, resp. rate (!) 27, height 5\' 2"  (1.575 m), weight 85.4 kg, SpO2 97%.    FiO2 (%):  [40 %-50 %] 50 %   Intake/Output Summary (Last 24 hours) at 01/13/2023 0936 Last data filed at 01/13/2023 0600 Gross per 24 hour  Intake 449.34 ml  Output 1300 ml  Net -850.66 ml   Filed Weights   01/11/23 0339 01/12/23 0414 01/13/23 0500  Weight: 88.3 kg 86.3 kg 85.4 kg    Examination: General: Chronically ill woman sitting up in bed eating her breakfast HENT: Strong voice, no secretions, no stridor, oropharynx clear, high flow nasal cannula in place Lungs: Decreased right-sided breath sounds, more clear on the left.  Scattered rhonchi.  No wheezing Cardiovascular: Irregular, A-fib on monitor, 110.  Systolic murmur present Abdomen: Obese, nondistended with positive bowel sounds Extremities: Left lower extremity wrapped, no right lower extremity edema Neuro: Awake, alert, appropriate, follows commands, answers questions.  Nonfocal GU: Deferred  Resolved Hospital Problem list     Assessment & Plan:   82 year old woman with acute on chronic hypoxemic respiratory failure due to systolic and diastolic CHF (with valvular disease and A-fib), right lower lobe and right middle lobe collapse and presumed pneumonia, right pleural effusion  Acute on chronic hypoxemic respiratory failure -Seems to have benefited from bronchoscopy, thoracentesis.  No clear indication at this time to repeat either -Continue to push pulmonary hygiene aggressively with flutter valve, I-S Mucomyst, guaifenesin -Has had BiPAP  available as needed.  Will change back to scheduled nightly as she has benefited.  It will likely help her  atelectasis   R PNA Right pleural effusion, thoracentesis 8/1.  Labs suggest a transudate although serum LDH was not obtained (protein <3).  Multifactorial from her pneumonia and her CHF -Appreciate cardiology assistance. -Trial increase Bumex 8/5 since it has been well-tolerated.  Follow chest x-ray, I/O and renal function -Unclear whether there would be any significant benefit to repeat right thoracentesis given the transudative component -Push pulmonary hygiene -Completes ceftriaxone 8/5, azithromycin 8/6 -Follow culture data  Diastolic and systolic CHF -Appreciate cardiology assistance -On Bumex 2 mg once daily at home, doing a trial of increased to 2 mg daily.  Follow volume status, renal function, hemodynamics  Paroxysmal atrial fibrillation, moderate rate control 110s -Continue low-dose metoprolol, consider up titration over the next 24-48 hours depending on how she tolerates her diuresis hemodynamically -Change amiodarone to p.o. 8/5 -Continue digoxin -Continue Eliquis  Disposition: Transfer to progressive care 8/5  Discussed plans and status with the patient and daughter at bedside on 8/4   Levy Pupa, MD, PhD 01/13/2023, 9:36 AM [facility] [phone] or if no answer before 7:00PM call [phone] For any issues after 7:00PM please call eLink [phone]

## 2023-01-13 NOTE — Progress Notes (Signed)
RT called to patient room due to patient's sats dropping to low 80s on 100% and NRB and becoming more obtunded.  Patient was placed on bipap and is tolerating well at this time.  Chest xray ordered.  Nebulizer treatment given as well.  Unit RT aware.  Will continue to monitor.

## 2023-01-13 NOTE — Progress Notes (Signed)
eLink Physician-Brief Progress Note Patient Name: [name] DOB: 03-Dec-1940 MRN: [id number]   Date of Service  01/13/2023  HPI/Events of Note  pt is on BPAP and NPO. Scheduled for lopressor, and protonix. Can these be changed to IV?  eICU Interventions  Protonix IV at bed time and lopressor IV prn ordered.  Asp precautions.      Intervention Category Minor Interventions: Routine modifications to care plan (e.g. PRN medications for pain, fever)  Ranee Gosselin 01/13/2023, 9:29 PM  01:55 bladder scan showing 413cc, do you want to insert foley or straight cath? Straight cath once, bladder scan prn. Call back if getting recurring re tension.

## 2023-01-13 NOTE — Plan of Care (Signed)

## 2023-01-13 NOTE — Progress Notes (Signed)
Pt trialed off HHFNC to salter at 6lpm and tolerated well until she had to be moved.  Pt became tachy and sats started to drop.  RT replaced HHFNC at this time with a NRB to help pt recover (settings in flowsheet).  RT will continue to monitor and try to titrate FiO2 when tolerated.

## 2023-01-13 NOTE — Progress Notes (Addendum)
eLink Physician-Brief Progress Note Patient Name: [name] DOB: 07-Oct-1940 MRN: [id number]   Date of Service  01/13/2023  HPI/Events of Note  pH, Arterial: 7.338 (L) pCO2 arterial: 71.0 (HH) pO2, Arterial: 160 (H) TCO2: 40 (H) Acid-Base Excess: 9.0 (H) Bicarbonate: 38.2 (H) O2 Saturation: 99  Camera eval done  On BiPAP 70%, rate 18, 23/8    Asp pneumonitis, rapidly accumulating pleural effusion on right is suspected. On eliquis. Pleural taping not done yet.   eICU Interventions  - on Rate 20, Ve > 9 lit  Follow ABG at mid night Asp precautions  Discussed with RN.   23:36   ABG: hypercarbia worsening, also has metabolic alkalosis- K/mag level is ok.  pH, Arterial: 7.316 (L) pCO2 arterial: 79.3 (HH) pO2, Arterial: 151 (H) TCO2: 43 (H) Acid-Base Excess: 11.0 (H) Bicarbonate: 40.5 (H) O2 Saturation: 99  Camera re eval done. Ve > 10 lit. Sats good.  Intervention Category Intermediate Interventions: Other: (ABG resp acidosis) -discussed with RT .  Increase rate to 24, cut down on fio2 to 28% ( sats >95% still). Get ABG on this.  If worsening, consider AVAP , low thresh hold for intubations Need re thoracentesis for CHF/ffusion-if not improving on diuresis-  to improve V/q mismatch.    Ranee Gosselin 01/13/2023, 8:33 PM  00:13  Soft BP 73/48. CxR reviewed. Right low volume from effusion and pneumonia. Mentating well left mild effusion. EF 55%. - LR bolus and start low dose levophed  CBG 78 and decreased urine out put.  Start D5 saline at 50 ml/hr.  Discussed with RN. Just has peripheral.   00:39 ABG reviewed, pco2 down to 70. Continue care Full code.   02:09  MAP still low. Start Levophed via PIV.It will be the responsibility of the bedside nurse to follow best practice to prevent extravasations.if needing over 8 mcg /min, need a central line   05:16 AM  BP 87/48. Pt is on Levo @ 8 mcgs via PIV. Do you want to give a bolus, albumin or add  vaso?  Camera eval : HR 89, 98% MAP 64, sats 94% on BiPAP. Leg edema  Wbc normal, no fever CxR decreasing effusion on bumex  - trial of albumin once. Will need central line if levo going over 10 mcg via PIV.  Get Chest x ray also  06:43 CxR film seen. Worsening low lung volume from increasing effusion, atelectasis. Discussed with RN. Albumin slightly better. On 8 mcg levo Consider bronchoscopy  and Thoracentesis in AM.

## 2023-01-13 NOTE — Progress Notes (Signed)
OT Cancellation Note  Patient Details Name: KATHLEENA CORIZ MRN: [id number] DOB: [birth year]   Cancelled Treatment:    Reason Eval/Treat Not Completed: Medical issues which prohibited therapy (Per RN, pt unable to tolerate at this time due to another respiratory event needing 100% FiO2. OT evaluation to f/u at a later date.)  Donia Pounds 01/13/2023, 2:03 PM

## 2023-01-13 NOTE — Progress Notes (Signed)
Progress Note  Patient Name: [name] Date of Encounter: 01/13/2023 Primary Cardiologist: Sherilyn Cooter [facility] ([name])  Subjective   Overnight has intermittently needed BIPAP and has continued in ICU Patient notes that she feels ok.  Vital Signs    Vitals:   01/13/23 0600 01/13/23 0729 01/13/23 0736 01/13/23 0843  BP: (!) 86/61 106/62    Pulse: (!) 112   (!) 115  Resp: (!) 27     Temp:   97.7 F (36.5 C)   TempSrc:   Axillary   SpO2: 93%   97%  Weight:      Height:        Intake/Output Summary (Last 24 hours) at 01/13/2023 0925 Last Peck filed at 01/13/2023 0600 Gross per 24 hour  Intake 449.34 ml  Output 1300 ml  Net -850.66 ml   Filed Weights   01/11/23 0339 01/12/23 0414 01/13/23 0500  Weight: 88.3 kg 86.3 kg 85.4 kg    Physical Exam   GEN: No acute distress.  Ill appearing Neck:  JVD into her neck while sitting Cardiac: IRIR tachycardia, distant heart sounds but systolic murmur  Respiratory: Coarse breath sounds bilaterally GI: Soft, nontender, non-distended  MS: No edema  Labs   Telemetry: AF rate controlled ~ 110 bpm   Chemistry Recent Labs  Lab 01/10/23 1007 01/10/23 1310 01/11/23 0314 01/12/23 0033  NA 137 138 137 133*  K 3.7 3.4* 3.7 3.5  CL 84*  --  88* 88*  CO2 41*  --  36* 32  GLUCOSE 130*  --  94 112*  BUN 14  --  13 11  CREATININE 0.83  --  0.71 0.65  CALCIUM 9.3  --  9.2 9.6  PROT 6.6  --   --   --   ALBUMIN 2.8*  --   --   --   AST 41  --   --   --   ALT 27  --   --   --   ALKPHOS 136*  --   --   --   BILITOT 0.9  --   --   --   GFRNONAA >60  --  >60 >60  ANIONGAP 12  --  13 13     Hematology Recent Labs  Lab 01/10/23 0146 01/10/23 1310 01/11/23 0314 01/12/23 0033  WBC 9.7  --  8.4 10.1  RBC 4.45  --  4.34 4.30  HGB 12.9 14.6 12.6 12.5  HCT 42.3 43.0 41.7 40.0  MCV 95.1  --  96.1 93.0  MCH 29.0  --  29.0 29.1  MCHC 30.5  --  30.2 31.3  RDW 14.6  --  14.7 14.8  PLT 220  --  195 192    Cardiac  EnzymesNo results for input(s): "TROPONINI" in the last 168 hours. No results for input(s): "TROPIPOC" in the last 168 hours.   BNPNo results for input(s): "BNP", "PROBNP" in the last 168 hours.   DDimer No results for input(s): "DDIMER" in the last 168 hours.   Cardiac Studies   Cardiac Studies & Procedures       ECHOCARDIOGRAM  ECHOCARDIOGRAM COMPLETE 01/05/2023  Narrative ECHOCARDIOGRAM REPORT    Patient Name:   [name] Date of Exam: 01/05/2023 Medical Rec #:  [id number]      Height:       62.0 in Accession #:    [id number]     Weight:       194.7 lb Date of Birth:  1940/06/12      BSA:          1.890 m Patient Age:    82 years       BP:           102/63 mmHg Patient Gender: F              HR:           102 bpm. Exam Location:  Inpatient  Procedure: 2D Echo, Cardiac Doppler and Color Doppler  Indications:    acute systolic chf  History:        Patient has prior history of Echocardiogram examinations, most recent 07/26/2013. History of Takotsubo, Arrythmias:Atrial Fibrillation; Signs/Symptoms:Shortness of Breath.  Sonographer:    Delcie Roch RDCS Referring Phys: [id number] CHING T TU  IMPRESSIONS   1. Left ventricular ejection fraction, by estimation, is 55% with beat to beat variability. The left ventricle has normal function. The left ventricle has no regional wall motion abnormalities. There is mild left ventricular hypertrophy. Left ventricular diastolic parameters are indeterminate. 2. Right ventricular systolic function is mildly reduced. The right ventricular size is moderately enlarged. There is moderately elevated pulmonary artery systolic pressure. The estimated right ventricular systolic pressure is 50.5 mmHg. 3. Left atrial size was severely dilated. 4. Right atrial size was severely dilated. 5. The mitral valve is degenerative. Severe mitral valve regurgitation. No evidence of mitral stenosis. Severe mitral annular calcification. 6. Tricuspid valve  regurgitation is severe. 7. The aortic valve is tricuspid. There is mild calcification of the aortic valve. Aortic valve regurgitation is mild. No aortic stenosis is present. 8. The inferior vena cava is dilated in size with <50% respiratory variability, suggesting right atrial pressure of 15 mmHg.  FINDINGS Left Ventricle: Left ventricular ejection fraction, by estimation, is 55%. The left ventricle has normal function. The left ventricle has no regional wall motion abnormalities. The left ventricular internal cavity size was normal in size. There is mild left ventricular hypertrophy. Left ventricular diastolic parameters are indeterminate.  Right Ventricle: The right ventricular size is moderately enlarged. No increase in right ventricular wall thickness. Right ventricular systolic function is mildly reduced. There is moderately elevated pulmonary artery systolic pressure. The tricuspid regurgitant velocity is 2.98 m/s, and with an assumed right atrial pressure of 15 mmHg, the estimated right ventricular systolic pressure is 50.5 mmHg.  Left [facility]: Left atrial size was severely dilated.  Right [facility]: Right atrial size was severely dilated.  Pericardium: There is no evidence of pericardial effusion.  Mitral Valve: The mitral valve is degenerative in appearance. Severe mitral annular calcification. Severe mitral valve regurgitation. No evidence of mitral valve stenosis. The mean mitral valve gradient is 3.0 mmHg with average heart rate of 101 bpm.  Tricuspid Valve: The tricuspid valve is normal in structure. 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 No evidence of tricuspid stenosis.  Aortic Valve: The aortic valve is tricuspid. There is mild calcification of the aortic valve. Aortic valve regurgitation is mild. Aortic regurgitation PHT measures 451 msec. No aortic stenosis is present.  Pulmonic Valve: The pulmonic valve was normal in structure. Pulmonic valve regurgitation is trivial. No  evidence of pulmonic stenosis.  Aorta: The aortic root is normal in size and structure.  Venous: The inferior vena cava is dilated in size with less than 50% respiratory variability, suggesting right atrial pressure of 15 mmHg. The inferior vena cava and the hepatic vein show a pattern of systolic flow reversal, suggestive of tricuspid regurgitation.  IAS/Shunts: No atrial level  shunt detected by color flow Doppler.  Additional Comments: There is pleural effusion in the right lateral region.   LEFT VENTRICLE PLAX 2D LVIDd:         4.80 cm LVIDs:         3.80 cm LV PW:         1.10 cm LV IVS:        1.20 cm LVOT diam:     2.10 cm LVOT Area:     3.46 cm   RIGHT VENTRICLE             IVC RV Basal diam:  3.60 cm     IVC diam: 2.60 cm RV S prime:     10.90 cm/s TAPSE (M-mode): 1.1 cm  LEFT ATRIUM              Index        RIGHT ATRIUM           Index LA diam:        5.10 cm  2.70 cm/m   RA Area:     29.70 cm LA Vol (A2C):   126.0 ml 66.67 ml/m  RA Volume:   104.00 ml 55.03 ml/m LA Vol (A4C):   94.0 ml  49.74 ml/m LA Biplane Vol: 109.0 ml 57.68 ml/m AORTIC VALVE AI PHT:      451 msec  AORTA Ao Root diam: 2.80 cm Ao Asc diam:  3.60 cm  MITRAL VALVE           TRICUSPID VALVE MV Mean grad: 3.0 mmHg TR Peak grad:   35.5 mmHg TR Vmax:        298.00 cm/s  SHUNTS Systemic Diam: 2.10 cm  Weston Brass MD Electronically signed by Weston Brass MD Signature Date/Time: 01/05/2023/8:37:43 PM    Final                  Assessment & Plan   Acute on chronic respiratory failure with hypoxia and hypercapnia Acute on chronic HFpEF with moderate right pleural effusion - CAP s/p bronch is component of her disease - had originally planned CXR and BNP; discussed with pulmonary team; we will trial increasing her bumex to 2 mg PO BID  Paroxysmal atrial fibrillation Hypotension requiring midodrine - on Eliquis 5 mg twice daily (no further procedures planned) - Continue  IV amio given hypotension and further rate control. Potentially transition when rates better controlled - Continue digoxin 0.125 mg.  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      Riley Lam, MD FASE [facility] Cardiologist [facility]  [address] [place], [address] [place], [place] [address] [phone]  9:25 AM

## 2023-01-13 NOTE — Progress Notes (Signed)
Physical Therapy Treatment Patient Details Name: [name] MRN: [id number] DOB: 06/30/1940 Today's Date: 01/13/2023   History of Present Illness 82 y.o. female admitted 7/27 with weakness, SOB, AMS. Pt with Rt pleural effusion, s/p thoracentesis 8/1. 8/2 intubated, bronch and extubated. 8/4 Afib with RVR. PMH: Takotsubo cardiomyopathy, CVA, DVT/PE, Vfib, chronic hypoxemic respiratory failure on 2L PRN , L LE non-healing wound/cellulitis    PT Comments  Pt pleasant and very willing to mobilize. Pt at rest before and after session on HHFNC 40L, 50% with pt able to tolerate NRB throughout mobility with clearance from Dr.Byrum at 97% SPO2. Pt performed 3 standing bouts limited by fatigue with encouragement to progress mobility, transfers and HEp.     If plan is discharge home, recommend the following: A little help with walking and/or transfers;A little help with bathing/dressing/bathroom;Assist for transportation;Help with stairs or ramp for entrance;A lot of help with bathing/dressing/bathroom;Assistance with cooking/housework   Can travel by private vehicle     Yes  Equipment Recommendations  BSC/3in1    Recommendations for Other Services       Precautions / Restrictions Precautions Precautions: Fall Precaution Comments: watch SpO2     Mobility  Bed Mobility Overal bed mobility: Needs Assistance Bed Mobility: Supine to Sit     Supine to sit: Supervision, HOB elevated     General bed mobility comments: 30 degree HOB with supervision for lines and increased time    Transfers Overall transfer level: Needs assistance   Transfers: Sit to/from Stand Sit to Stand: Min guard           General transfer comment: cues for hand placement from bed, BSC and recliner x 3 trials    Ambulation/Gait Ambulation/Gait assistance: Min assist, +2 safety/equipment Gait Distance (Feet): 8 Feet Assistive device: Rolling walker (2 wheels) Gait Pattern/deviations: Step-through pattern,  Trunk flexed, Wide base of support   Gait velocity interpretation: <1.8 ft/sec, indicate of risk for recurrent falls   General Gait Details: cues for posture, proximity to RW and safety. Pt walked 3' bed to BSc then 2 trials of 8' with chair follow and RW   Stairs             Wheelchair Mobility     Tilt Bed    Modified Rankin (Stroke Patients Only)       Balance Overall balance assessment: Needs assistance Sitting-balance support: No upper extremity supported, Feet supported Sitting balance-Leahy Scale: Fair     Standing balance support: Bilateral upper extremity supported, During functional activity, Reliant on assistive device for balance Standing balance-Leahy Scale: Poor Standing balance comment: dependent on RW                            Cognition Arousal/Alertness: Awake/alert Behavior During Therapy: WFL for tasks assessed/performed Overall Cognitive Status: Within Functional Limits for tasks assessed                                          Exercises General Exercises - Lower Extremity Long Arc Quad: AROM, Both, 15 reps, Seated    General Comments        Pertinent Vitals/Pain Pain Assessment Pain Assessment: No/denies pain    Home Living  Prior Function            PT Goals (current goals can now be found in the care plan section) Progress towards PT goals: Progressing toward goals    Frequency    Min 1X/week      PT Plan Current plan remains appropriate    Co-evaluation              AM-PAC PT "6 Clicks" Mobility   Outcome Measure  Help needed turning from your back to your side while in a flat bed without using bedrails?: A Little Help needed moving from lying on your back to sitting on the side of a flat bed without using bedrails?: A Little Help needed moving to and from a bed to a chair (including a wheelchair)?: A Little Help needed standing up from a  chair using your arms (e.g., wheelchair or bedside chair)?: A Lot Help needed to walk in hospital room?: A Lot Help needed climbing 3-5 steps with a railing? : Total 6 Click Score: 14    End of Session Equipment Utilized During Treatment: Oxygen;Gait belt Activity Tolerance: Patient tolerated treatment well Patient left: in chair;with call bell/phone within reach;with chair alarm set Nurse Communication: Mobility status PT Visit Diagnosis: Unsteadiness on feet (R26.81);Muscle weakness (generalized) (M62.81);Difficulty in walking, not elsewhere classified (R26.2)     Time: 1610-9604 PT Time Calculation (min) (ACUTE ONLY): 28 min  Charges:    $Gait Training: 8-22 mins $Therapeutic Activity: 8-22 mins PT General Charges $$ ACUTE PT VISIT: 1 Visit                     Merryl Hacker, PT Acute Rehabilitation Services Office: [phone]    Cristine Polio 01/13/2023, 8:47 AM

## 2023-01-13 NOTE — Progress Notes (Signed)
Initial Nutrition Assessment  DOCUMENTATION CODES:   Not applicable  INTERVENTION:  Adjust diet order to DYS 2 with ordering assistance 1 packet Juven BID, each packet provides 95 calories, 2.5 grams of protein (collagen), and 9.8 grams of carbohydrate (3 grams sugar); also contains 7 grams of L-arginine and L-glutamine, 300 mg vitamin C, 15 mg vitamin E, 1.2 mcg vitamin B-12, 9.5 mg zinc, 200 mg calcium, and 1.5 g  Calcium Beta-hydroxy-Beta-methylbutyrate to support wound healing Magic cup TID with meals, each supplement provides 290 kcal and 9 grams of protein MVI with minerals  NUTRITION DIAGNOSIS:  Increased nutrient needs related to wound healing as evidenced by estimated needs.  GOAL:  Patient will meet greater than or equal to 90% of their needs  MONITOR:  PO intake, Supplement acceptance, I & O's, Labs  REASON FOR ASSESSMENT:   (Pressure Injury)    ASSESSMENT:  Pt with hx of CHF, previous stroke, PAF, and DVT presented to ED with SOB worsening over the last week. Found to have CHF exacerbation. Of note, pt with wounds and edema to the BLE that are cared for by home health.  8/1 - right thoracentesis, 1.1L of bloody fluid drained 8/2 - intubated, bronchoscopy, extubated  Pt resting in bedside chair at the time of assessment. States that this AM she did not each much. Is feeling a little nauseated and also reports that she had PT work with her when her breakfast arrived and by the time she was done her food was cold. Pt reports that intake was ok at home, but that she has lost a lot of weight recently. States that her dentures are know loose. Limited weight hx available, no weights since 2020 but will adjust diet order to DYS 2 for easier pt meal consumption. Discussed with RN.  Pt reports that wound on her leg has been present for about a month. Reports that it is getting smaller, but did go ahead and add Juven BID to aid in wound healing. Also discussed ensure, pt declines  and does not like but is agreeable to [employer].  Average Meal Intake: 7/30-8/4: 84% intake x 5 recorded meals  Nutritionally Relevant Medications: Scheduled Meds:  calcium carbonate  1 tablet Oral BID WC   cholecalciferol  1,000 Units Oral Daily   cyanocobalamin  1,000 mcg Oral Daily   digoxin  0.125 mg Oral Daily   docusate sodium  100 mg Oral Daily   pantoprazole  40 mg Oral QHS   polyethylene glycol  17 g Oral Daily   rosuvastatin  5 mg Oral Daily   senna-docusate  1 tablet Oral BID   Continuous Infusions:  sodium chloride     amiodarone 30 mg/hr (01/13/23 0508)   cefTRIAXone (ROCEPHIN)  IV 1 g (01/13/23 0821)   Labs Reviewed: Na 133, chloride 88 CBG ranges from 109-150 mg/dL over the last 24 hours HgbA1c 6.5%  NUTRITION - FOCUSED PHYSICAL EXAM: Flowsheet Row Most Recent Value  Orbital Region Mild depletion  Upper Arm Region No depletion  Thoracic and Lumbar Region No depletion  Buccal Region No depletion  Temple Region No depletion  Clavicle Bone Region No depletion  Clavicle and Acromion Bone Region Mild depletion  Scapular Bone Region No depletion  Dorsal Hand No depletion  Patellar Region No depletion  Anterior Thigh Region No depletion  Posterior Calf Region No depletion  [edema]  Edema (RD Assessment) Moderate  [BLE]  Hair Reviewed  Eyes Reviewed  Mouth Reviewed  Skin Reviewed  Nails Reviewed    Diet Order:   Diet Order             DIET DYS 2 Room service appropriate? Yes with Assist; Fluid consistency: Thin  Diet effective now                   EDUCATION NEEDS:  Not appropriate for education at this time  Skin:  Skin Assessment: Reviewed RN Assessment Skin tear:  - left buttocks Stage 2: - left tibial area   Last BM:  8/4 - type 4  Height:  Ht Readings from Last 1 Encounters:  01/04/23 5\' 2"  (1.575 m)    Weight:  Wt Readings from Last 1 Encounters:  01/13/23 85.4 kg    Ideal Body Weight:  50 kg  BMI:  Body mass index is  34.44 kg/m.  Estimated Nutritional Needs:  Kcal:  1500-1700 kcal/d Protein:  75-90g/d Fluid:  1.5-1.7L/d    Greig Castilla, RD, LDN Clinical Dietitian RD pager # available in [facility]  After hours/weekend pager # available in [facility]

## 2023-01-14 ENCOUNTER — Inpatient Hospital Stay (HOSPITAL_COMMUNITY): Payer: Medicare Other

## 2023-01-14 DIAGNOSIS — J9602 Acute respiratory failure with hypercapnia: Secondary | ICD-10-CM | POA: Diagnosis not present

## 2023-01-14 DIAGNOSIS — J9621 Acute and chronic respiratory failure with hypoxia: Secondary | ICD-10-CM | POA: Diagnosis not present

## 2023-01-14 DIAGNOSIS — J9622 Acute and chronic respiratory failure with hypercapnia: Secondary | ICD-10-CM | POA: Diagnosis not present

## 2023-01-14 DIAGNOSIS — J9601 Acute respiratory failure with hypoxia: Secondary | ICD-10-CM | POA: Diagnosis not present

## 2023-01-14 LAB — CBC WITH DIFFERENTIAL/PLATELET
Abs Immature Granulocytes: 0.12 10*3/uL — ABNORMAL HIGH (ref 0.00–0.07)
Basophils Absolute: 0 10*3/uL (ref 0.0–0.1)
Basophils Relative: 0 %
Eosinophils Absolute: 0 10*3/uL (ref 0.0–0.5)
Eosinophils Relative: 0 %
HCT: 42.6 % (ref 36.0–46.0)
Hemoglobin: 13.1 g/dL (ref 12.0–15.0)
Immature Granulocytes: 1 %
Lymphocytes Relative: 8 %
Lymphs Abs: 0.8 10*3/uL (ref 0.7–4.0)
MCH: 29.8 pg (ref 26.0–34.0)
MCHC: 30.8 g/dL (ref 30.0–36.0)
MCV: 97 fL (ref 80.0–100.0)
Monocytes Absolute: 1.6 10*3/uL — ABNORMAL HIGH (ref 0.1–1.0)
Monocytes Relative: 17 %
Neutro Abs: 7.1 10*3/uL (ref 1.7–7.7)
Neutrophils Relative %: 74 %
Platelets: 126 10*3/uL — ABNORMAL LOW (ref 150–400)
RBC: 4.39 MIL/uL (ref 3.87–5.11)
RDW: 14.7 % (ref 11.5–15.5)
WBC: 9.6 10*3/uL (ref 4.0–10.5)
nRBC: 0 % (ref 0.0–0.2)

## 2023-01-14 LAB — POCT I-STAT 7, (LYTES, BLD GAS, ICA,H+H)
Acid-Base Excess: 10 mmol/L — ABNORMAL HIGH (ref 0.0–2.0)
Bicarbonate: 38.7 mmol/L — ABNORMAL HIGH (ref 20.0–28.0)
Calcium, Ion: 1.29 mmol/L (ref 1.15–1.40)
HCT: 40 % (ref 36.0–46.0)
Hemoglobin: 13.6 g/dL (ref 12.0–15.0)
O2 Saturation: 89 %
Potassium: 4.1 mmol/L (ref 3.5–5.1)
Sodium: 133 mmol/L — ABNORMAL LOW (ref 135–145)
TCO2: 41 mmol/L — ABNORMAL HIGH (ref 22–32)
pCO2 arterial: 70.8 mmHg (ref 32–48)
pH, Arterial: 7.345 — ABNORMAL LOW (ref 7.35–7.45)
pO2, Arterial: 63 mmHg — ABNORMAL LOW (ref 83–108)

## 2023-01-14 LAB — GLUCOSE, CAPILLARY
Glucose-Capillary: 109 mg/dL — ABNORMAL HIGH (ref 70–99)
Glucose-Capillary: 115 mg/dL — ABNORMAL HIGH (ref 70–99)
Glucose-Capillary: 118 mg/dL — ABNORMAL HIGH (ref 70–99)
Glucose-Capillary: 128 mg/dL — ABNORMAL HIGH (ref 70–99)
Glucose-Capillary: 80 mg/dL (ref 70–99)
Glucose-Capillary: 91 mg/dL (ref 70–99)

## 2023-01-14 MED ORDER — SODIUM CHLORIDE 0.9 % IV SOLN
3.0000 g | Freq: Four times a day (QID) | INTRAVENOUS | Status: DC
  Administered 2023-01-14 – 2023-01-18 (×16): 3 g via INTRAVENOUS
  Filled 2023-01-14 (×17): qty 8

## 2023-01-14 MED ORDER — ALBUMIN HUMAN 25 % IV SOLN
12.5000 g | Freq: Once | INTRAVENOUS | Status: AC
  Administered 2023-01-14: 12.5 g via INTRAVENOUS
  Filled 2023-01-14: qty 50

## 2023-01-14 MED ORDER — LACTATED RINGERS IV BOLUS
500.0000 mL | Freq: Once | INTRAVENOUS | Status: AC
  Administered 2023-01-14: 500 mL via INTRAVENOUS

## 2023-01-14 MED ORDER — DEXTROSE-SODIUM CHLORIDE 5-0.9 % IV SOLN: INTRAVENOUS | Status: AC

## 2023-01-14 MED ORDER — SODIUM CHLORIDE 0.9 % IV SOLN: 250.0000 mL | INTRAVENOUS | Status: DC

## 2023-01-14 MED ORDER — HEPARIN SODIUM (PORCINE) 5000 UNIT/ML IJ SOLN
5000.0000 [IU] | Freq: Three times a day (TID) | INTRAMUSCULAR | Status: DC
  Administered 2023-01-14 – 2023-01-17 (×10): 5000 [IU] via SUBCUTANEOUS
  Filled 2023-01-14 (×10): qty 1

## 2023-01-14 MED ORDER — NOREPINEPHRINE 4 MG/250ML-% IV SOLN
2.0000 ug/min | INTRAVENOUS | Status: DC
  Administered 2023-01-14: 2 ug/min via INTRAVENOUS
  Administered 2023-01-14: 9 ug/min via INTRAVENOUS
  Administered 2023-01-14: 8 ug/min via INTRAVENOUS
  Administered 2023-01-15: 7 ug/min via INTRAVENOUS
  Administered 2023-01-15: 9 ug/min via INTRAVENOUS
  Filled 2023-01-14 (×4): qty 250

## 2023-01-14 NOTE — Progress Notes (Signed)
OT Cancellation Note  Patient Details Name: [name] MRN: [id number] DOB: 22-Jun-1940   Cancelled Treatment:    Reason Eval/Treat Not Completed: Medical issues which prohibited therapy (Continues to have respiratory distress, unable to tolerate therapy at this time.)  Donia Pounds 01/14/2023, 10:45 AM

## 2023-01-14 NOTE — Plan of Care (Signed)
?  Problem: Education: ?Goal: Knowledge of General Education information will improve ?Description: Including pain rating scale, medication(s)/side effects and non-pharmacologic comfort measures ?Outcome: Progressing ?  ?Problem: Health Behavior/Discharge Planning: ?Goal: Ability to manage health-related needs will improve ?Outcome: Progressing ?  ?Problem: Clinical Measurements: ?Goal: Ability to maintain clinical measurements within normal limits will improve ?Outcome: Progressing ?Goal: Will remain free from infection ?Outcome: Progressing ?Goal: Diagnostic test results will improve ?Outcome: Progressing ?Goal: Cardiovascular complication will be avoided ?Outcome: Progressing ?  ?Problem: Nutrition: ?Goal: Adequate nutrition will be maintained ?Outcome: Progressing ?  ?Problem: Coping: ?Goal: Level of anxiety will decrease ?Outcome: Progressing ?  ?Problem: Elimination: ?Goal: Will not experience complications related to bowel motility ?Outcome: Progressing ?Goal: Will not experience complications related to urinary retention ?Outcome: Progressing ?  ?Problem: Pain Managment: ?Goal: General experience of comfort will improve ?Outcome: Progressing ?  ?Problem: Safety: ?Goal: Ability to remain free from injury will improve ?Outcome: Progressing ?  ?Problem: Skin Integrity: ?Goal: Risk for impaired skin integrity will decrease ?Outcome: Progressing ?  ?

## 2023-01-14 NOTE — Progress Notes (Addendum)
Consult placed due to vasopressor infusion order. 2 USGPIV have been placed. Verified with unit RN. Advised to place consult if additional access is needed.

## 2023-01-14 NOTE — Progress Notes (Addendum)
NAME:  [name], MRN:  [id number], DOB:  04-20-1941, LOS: 10 ADMISSION DATE:  01/04/2023, CONSULTATION DATE: 01/08/2023 REFERRING MD: Dr. Jomarie Longs, CHIEF COMPLAINT: Right pleural effusion  History of Present Illness:  4 F, history of A-fib, DVT/PE, Takotsubo cardiomyopathy with associated systolic CHF, CVA with residual deficits.  Admitted with weakness, dyspnea, lower extremity edema and encephalopathy on 7/27. Has been treated for volume overload but only moderate response to diuretics.  Right lower lobe infiltrate and severe volume loss consistent with pneumonia/atelectasis with coexisting right pleural effusion.  Underwent right thoracentesis with 1.1 L removed 8/1.  Then bronchoscopy on 8/2 for secretion clearance to hopefully resolve some of her right lower lobe and right middle lobe atelectasis.  She was intubated for that procedure, able to be extubated afterwards on 8/2.  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  Extubated postprocedure 8/5 recurrent total right atelectasis, increased respiratory  needs >> BiPAP  Interim History / Subjective:  Decompensation afternoon 8/5 with complete right atelectasis on chest x-ray, increased work of breathing requiring BiPAP.  She has been on BiPAP since that time Question whether there may have been a component of occult aspiration as her decompensation happened after eating, unclear Received IV fluid bolus due to associated hypotension I/O+ 97 cc total Atrial fibrillation >> back on IV amiodarone when she was made n.p.o.    Objective   Blood pressure (!) 99/57, pulse 95, temperature 98.2 F (36.8 C), temperature source Axillary, resp. rate (!) 24, height 5\' 2"  (1.575 m), weight 85.4 kg, SpO2 98%.    Vent Mode: BIPAP FiO2 (%):  [28 %-100 %] 35 % Set Rate:  [18 bmp-24 bmp] 24 bmp Vt Set:  [400 mL] 400 mL PEEP:  [8 cmH20] 8 cmH20 Pressure Support:  [23 cmH20] 23 cmH20   Intake/Output Summary (Last 24 hours) at 01/14/2023 0815 Last data filed at 01/14/2023 0700 Gross per 24 hour  Intake 1438.11 ml  Output 475 ml  Net 963.11 ml   Filed Weights   01/12/23 0414 01/13/23 0500 01/14/23 0500  Weight: 86.3 kg 85.4 kg 85.4 kg    Examination: General: Chronically ill-appearing woman, laying in bed with BiPAP in place HENT: BiPAP, unable to assess oropharynx, no obvious secretions Lungs: Worsened decreased on the right, more clear on the left, scattered rhonchi Cardiovascular: Irregularly irregular, heart rate 100 Abdomen: Distended, hypoactive bowel sounds Extremities: Left lower extremity wrapped, no lower extremity edema Neuro: Wakes to voice and pain, with grimace.  She does answer questions.  Moves both upper extremities GU: Deferred  Resolved Hospital Problem list     Assessment & Plan:   82 year old woman with acute on chronic hypoxemic respiratory failure  due to systolic and diastolic CHF (with valvular disease and A-fib), right lower lobe and right middle lobe collapse and presumed pneumonia, right pleural effusion  Acute on  chronic hypoxemic respiratory failure.  Had rebounded some after bronchoscopy/thoracentesis but backtrack 8/5 with total right atelectasis -Remains on BiPAP, unclear whether we will be able to effectively wean.  Chest x-ray does show improved right upper lobe aeration but unclear whether she will be able to maintain this off of positive pressure. -Secretion management, I-S, Mucomyst, guaifenesin will be important but unclear whether they will be adequate for her to keep right upper lobe open. -Unclear whether repeat bronchoscopy would be beneficial overall since she would require intubation, significant escalation of her care to accomplish without any guarantee that she could be successfully extubated.  Will need to speak with family, consider this in context of her overall goals of care -Antibiotics as below  R PNA Right pleural effusion, thoracentesis 8/1.  Labs suggest a transudate although serum LDH was not obtained (protein <3).  Multifactorial from her pneumonia and her CHF -Completed course of ceftriaxone/azithromycin.  Now with recurrent right sided collapse, question of possible aspiration contributing to this (versus just inability to clear secretions).  Start Unasyn 8/6 -Unclear whether there would be any significant benefit to repeat right thoracentesis.  Could consider -Continue to follow culture data, unrevealing so far  Shock, suspect in part due to diuresis in the setting of her systolic CHF but also concerning for possible component sepsis given progressive right-sided infiltrate, possible aspiration -On norepinephrine, wean as able -Received IV fluid boluses 8/5, would like to defer further if possible given her history of CHF  Diastolic and systolic CHF Paroxysmal atrial fibrillation, adequate rate control -Appreciate cardiology assistance -Hold Bumex, hold metoprolol given her current norepinephrine requirement -Back on amiodarone IV since she is n.p.o. -Digoxin stopped  8/5 -Eliquis held since she is n.p.o. > start heparin sq, back to Eliquis when able to do so  Hyponatremia -follow BMP, UOP with diuresis  Disposition: ICU at this time  Discussed plans and status with the patient and daughter at bedside on 8/4.  Family updated 8/5 when she went back on BiPAP by Dr. Chestine Spore.  Dr Delton Coombes updated pt's daughter [name] regarding last 24h, level of support. Have explained that she may not survive this illness, that we need to consider limitations on her care especially MV from which she would likely not be liberated / survive.   Critical care time 45 minutes   Levy Pupa, MD, PhD 01/14/2023, 8:15 AM [facility] [phone] or if no answer before 7:00PM call [phone] For any issues after 7:00PM please call eLink [phone]

## 2023-01-14 NOTE — Progress Notes (Signed)
Progress Note  Patient Name: [name] Date of Encounter: 01/14/2023 Primary Cardiologist: Sherilyn Cooter [facility] ([name])  Subjective   Worsening breathing.  New Pressor requirement BIPAP. Consideration of bronch vs repeat thoracentesis She is short of breath and still feels week.  Vital Signs    Vitals:   01/14/23 0730 01/14/23 0739 01/14/23 0741 01/14/23 0745  BP: (!) 86/58  (!) 86/58 (!) 99/57  Pulse: 91   95  Resp: (!) 22   (!) 24  Temp:  98.2 F (36.8 C)    TempSrc:  Axillary    SpO2: 98%   98%  Weight:      Height:        Intake/Output Summary (Last 24 hours) at 01/14/2023 0809 Last data filed at 01/14/2023 0700 Gross per 24 hour  Intake 1438.11 ml  Output 475 ml  Net 963.11 ml   Filed Weights   01/12/23 0414 01/13/23 0500 01/14/23 0500  Weight: 86.3 kg 85.4 kg 85.4 kg    Physical Exam   GEN: mild acute distress.  Ill appearing Neck:  JVD into her neck while sitting Cardiac: IRIR tachycardia, distant heart sounds but systolic murmur  Respiratory: Coarse breath sounds worse on the right side GI: Soft, nontender, non-distended  MS: No edema  Labs   Telemetry: AF rate controlled ~ 102   Chemistry Recent Labs  Lab 01/10/23 1007 01/10/23 1310 01/12/23 0033 01/13/23 0904 01/13/23 1743 01/13/23 2325 01/14/23 0022 01/14/23 0107  NA 137   < > 133* 133*   < > 134* 133* 133*  K 3.7   < > 3.5 4.0   < > 4.1 4.1 4.7  CL 84*   < > 88* 89*  --   --   --  88*  CO2 41*   < > 32 32  --   --   --  32  GLUCOSE 130*   < > 112* 153*  --   --   --  76  BUN 14   < > 11 10  --   --   --  14  CREATININE 0.83   < > 0.65 0.66  --   --   --  0.76  CALCIUM 9.3   < > 9.6 9.3  --   --   --  9.2  PROT 6.6  --   --   --   --   --   --   --   ALBUMIN 2.8*  --   --   --   --   --   --   --   AST 41  --   --   --   --   --   --   --   ALT 27  --   --   --   --   --   --   --   ALKPHOS 136*  --   --   --   --   --   --   --   BILITOT 0.9  --   --   --   --   --   --    --   GFRNONAA >60   < > >60 >60  --   --   --  >60  ANIONGAP 12   < > 13 12  --   --   --  13   < > = values in this interval not displayed.     Hematology Recent  Labs  Lab 01/11/23 0314 01/12/23 0033 01/13/23 1743 01/13/23 2325 01/14/23 0022 01/14/23 0107  WBC 8.4 10.1  --   --   --  9.6  RBC 4.34 4.30  --   --   --  4.39  HGB 12.6 12.5   < > 13.6 13.6 13.1  HCT 41.7 40.0   < > 40.0 40.0 42.6  MCV 96.1 93.0  --   --   --  97.0  MCH 29.0 29.1  --   --   --  29.8  MCHC 30.2 31.3  --   --   --  30.8  RDW 14.7 14.8  --   --   --  14.7  PLT 195 192  --   --   --  126*   < > = values in this interval not displayed.    Cardiac EnzymesNo results for input(s): "TROPONINI" in the last 168 hours. No results for input(s): "TROPIPOC" in the last 168 hours.   BNPNo results for input(s): "BNP", "PROBNP" in the last 168 hours.   DDimer No results for input(s): "DDIMER" in the last 168 hours.   Cardiac Studies   Cardiac Studies & Procedures       ECHOCARDIOGRAM  ECHOCARDIOGRAM COMPLETE 01/05/2023  Narrative ECHOCARDIOGRAM REPORT    Patient Name:   CHASIE FINEFROCK Date of Exam: 01/05/2023 Medical Rec #:  [id number]      Height:       62.0 in Accession #:    [id number]     Weight:       194.7 lb Date of Birth:  05-27-1941      BSA:          1.890 m Patient Age:    82 years       BP:           102/63 mmHg Patient Gender: F              HR:           102 bpm. Exam Location:  Inpatient  Procedure: 2D Echo, Cardiac Doppler and Color Doppler  Indications:    acute systolic chf  History:        Patient has prior history of Echocardiogram examinations, most recent 07/26/2013. History of Takotsubo, Arrythmias:Atrial Fibrillation; Signs/Symptoms:Shortness of Breath.  Sonographer:    Delcie Roch RDCS Referring Phys: [id number] CHING T TU  IMPRESSIONS   1. Left ventricular ejection fraction, by estimation, is 55% with beat to beat variability. The left ventricle has normal  function. The left ventricle has no regional wall motion abnormalities. There is mild left ventricular hypertrophy. Left ventricular diastolic parameters are indeterminate. 2. Right ventricular systolic function is mildly reduced. The right ventricular size is moderately enlarged. There is moderately elevated pulmonary artery systolic pressure. The estimated right ventricular systolic pressure is 50.5 mmHg. 3. Left atrial size was severely dilated. 4. Right atrial size was severely dilated. 5. The mitral valve is degenerative. Severe mitral valve regurgitation. No evidence of mitral stenosis. Severe mitral annular calcification. 6. 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 7. The aortic valve is tricuspid. There is mild calcification of the aortic valve. Aortic valve regurgitation is mild. No aortic stenosis is present. 8. The inferior vena cava is dilated in size with <50% respiratory variability, suggesting right atrial pressure of 15 mmHg.  FINDINGS Left Ventricle: Left ventricular ejection fraction, by estimation, is 55%. The left ventricle has normal function. The left ventricle has no regional wall motion abnormalities.  The left ventricular internal cavity size was normal in size. There is mild left ventricular hypertrophy. Left ventricular diastolic parameters are indeterminate.  Right Ventricle: The right ventricular size is moderately enlarged. No increase in right ventricular wall thickness. Right ventricular systolic function is mildly reduced. There is moderately elevated pulmonary artery systolic pressure. The tricuspid regurgitant velocity is 2.98 m/s, and with an assumed right atrial pressure of 15 mmHg, the estimated right ventricular systolic pressure is 50.5 mmHg.  Left Atrium: Left atrial size was severely dilated.  Right Atrium: Right atrial size was severely dilated.  Pericardium: There is no evidence of pericardial effusion.  Mitral Valve: The mitral valve is degenerative  in appearance. Severe mitral annular calcification. Severe mitral valve regurgitation. No evidence of mitral valve stenosis. The mean mitral valve gradient is 3.0 mmHg with average heart rate of 101 bpm.  Tricuspid Valve: The tricuspid valve is normal in structure. 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 No evidence of tricuspid stenosis.  Aortic Valve: The aortic valve is tricuspid. There is mild calcification of the aortic valve. Aortic valve regurgitation is mild. Aortic regurgitation PHT measures 451 msec. No aortic stenosis is present.  Pulmonic Valve: The pulmonic valve was normal in structure. Pulmonic valve regurgitation is trivial. No evidence of pulmonic stenosis.  Aorta: The aortic root is normal in size and structure.  Venous: The inferior vena cava is dilated in size with less than 50% respiratory variability, suggesting right atrial pressure of 15 mmHg. The inferior vena cava and the hepatic vein show a pattern of systolic flow reversal, suggestive of tricuspid regurgitation.  IAS/Shunts: No atrial level shunt detected by color flow Doppler.  Additional Comments: There is pleural effusion in the right lateral region.   LEFT VENTRICLE PLAX 2D LVIDd:         4.80 cm LVIDs:         3.80 cm LV PW:         1.10 cm LV IVS:        1.20 cm LVOT diam:     2.10 cm LVOT Area:     3.46 cm   RIGHT VENTRICLE             IVC RV Basal diam:  3.60 cm     IVC diam: 2.60 cm RV S prime:     10.90 cm/s TAPSE (M-mode): 1.1 cm  LEFT ATRIUM              Index        RIGHT ATRIUM           Index LA diam:        5.10 cm  2.70 cm/m   RA Area:     29.70 cm LA Vol (A2C):   126.0 ml 66.67 ml/m  RA Volume:   104.00 ml 55.03 ml/m LA Vol (A4C):   94.0 ml  49.74 ml/m LA Biplane Vol: 109.0 ml 57.68 ml/m AORTIC VALVE AI PHT:      451 msec  AORTA Ao Root diam: 2.80 cm Ao Asc diam:  3.60 cm  MITRAL VALVE           TRICUSPID VALVE MV Mean grad: 3.0 mmHg TR Peak grad:   35.5 mmHg TR  Vmax:        298.00 cm/s  SHUNTS Systemic Diam: 2.10 cm  Weston Brass MD Electronically signed by Weston Brass MD Signature Date/Time: 01/05/2023/8:37:43 PM    Final  Assessment & Plan   Acute on chronic respiratory failure with hypoxia and hypercapnia Acute on chronic HFpEF with moderate right pleural effusion - CAP s/p bronch is component of her disease, re-accumulation vs repeat aspiration PNA - symptomatic hypotension s/p bumex will hold diuretics, now on vasopressors  Paroxysmal atrial fibrillation Hypotension requiring midodrine and pressors - her heparin is held presently while a discussion with family is made on the pros and cons of further intervention (bronchoscopy) if GOC allow will restart AC when able - Continue IV amio  - discussed with primary, holding digoxin at this time - holding metoprolol (cannot take PO)   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      Riley Lam, MD FASE [facility] Cardiologist [facility]  [address] [place], [address] [place], [place] [address] [phone]  8:09 AM

## 2023-01-14 NOTE — Progress Notes (Signed)
Pharmacy Antibiotic Note  [name] is a 82 y.o. female admitted on 01/04/2023 with  lower extremity edema, weakness, dyspnea, encephalopathy .  Pharmacy has been consulted for unasyn dosing.  Plan: Reinitiate antibiotics due to concern for aspiration and requiring BiPAP at this time, worsening CXR. Unasyn 3g q6h IV  Height: 5\' 2"  (157.5 cm) Weight: 85.4 kg (188 lb 4.4 oz) IBW/kg (Calculated) : 50.1  Temp (24hrs), Avg:98.1 F (36.7 C), Min:97.2 F (36.2 C), Max:98.6 F (37 C)  Recent Labs  Lab 01/09/23 0304 01/10/23 0146 01/10/23 1007 01/11/23 0314 01/12/23 0033 01/13/23 0904 01/14/23 0107  WBC 8.5 9.7  --  8.4 10.1  --  9.6  CREATININE 0.74  --  0.83 0.71 0.65 0.66 0.76    Estimated Creatinine Clearance: 54.9 mL/min (by C-G formula based on SCr of 0.76 mg/dL).    Allergies  Allergen Reactions   Codeine    Morphine And Codeine Other (See Comments)    confusion    Antimicrobials this admission: Unasyn 8/6> Ceftriaxone 7/27 > 7/31; 8/1>8/5 -missed 1 day  Azithro 8/2> (8/6)  Microbiology results: 7/27 BCX: 1/2 coryne 8/1 Pleural fx: NGx2 8/2 BAL: yeast. Reincubated.   Thank you for allowing pharmacy to be a part of this patient's care.  Rutherford Nail, PharmD PGY2 Critical Care Pharmacy Resident 01/14/2023 8:34 AM

## 2023-01-15 ENCOUNTER — Inpatient Hospital Stay (HOSPITAL_COMMUNITY): Payer: Medicare Other

## 2023-01-15 DIAGNOSIS — J9622 Acute and chronic respiratory failure with hypercapnia: Secondary | ICD-10-CM | POA: Diagnosis not present

## 2023-01-15 DIAGNOSIS — J9621 Acute and chronic respiratory failure with hypoxia: Secondary | ICD-10-CM | POA: Diagnosis not present

## 2023-01-15 LAB — GLUCOSE, CAPILLARY
Glucose-Capillary: 105 mg/dL — ABNORMAL HIGH (ref 70–99)
Glucose-Capillary: 120 mg/dL — ABNORMAL HIGH (ref 70–99)
Glucose-Capillary: 124 mg/dL — ABNORMAL HIGH (ref 70–99)
Glucose-Capillary: 144 mg/dL — ABNORMAL HIGH (ref 70–99)
Glucose-Capillary: 77 mg/dL (ref 70–99)
Glucose-Capillary: 78 mg/dL (ref 70–99)

## 2023-01-15 LAB — BASIC METABOLIC PANEL
Anion gap: 16 — ABNORMAL HIGH (ref 5–15)
BUN: 7 mg/dL — ABNORMAL LOW (ref 8–23)
CO2: 27 mmol/L (ref 22–32)
Calcium: 8.8 mg/dL — ABNORMAL LOW (ref 8.9–10.3)
Chloride: 92 mmol/L — ABNORMAL LOW (ref 98–111)
Creatinine, Ser: 0.63 mg/dL (ref 0.44–1.00)
GFR, Estimated: >60 mL/min (ref >60)
Glucose, Bld: 117 mg/dL — ABNORMAL HIGH (ref 70–99)
Potassium: 5.1 mmol/L (ref 3.5–5.1)
Sodium: 135 mmol/L (ref 135–145)

## 2023-01-15 LAB — MAGNESIUM: Magnesium: 2 mg/dL (ref 1.7–2.4)

## 2023-01-15 LAB — PHOSPHORUS: Phosphorus: 2 mg/dL — ABNORMAL LOW (ref 2.5–4.6)

## 2023-01-15 MED ORDER — SODIUM PHOSPHATES 45 MMOLE/15ML IV SOLN
30.0000 mmol | Freq: Once | INTRAVENOUS | Status: AC
  Administered 2023-01-15: 30 mmol via INTRAVENOUS
  Filled 2023-01-15: qty 10

## 2023-01-15 MED ORDER — AMIODARONE IV BOLUS ONLY 150 MG/100ML
150.0000 mg | Freq: Once | INTRAVENOUS | Status: AC
  Administered 2023-01-15: 150 mg via INTRAVENOUS

## 2023-01-15 MED ORDER — METOPROLOL TARTRATE 5 MG/5ML IV SOLN
5.0000 mg | Freq: Once | INTRAVENOUS | Status: AC
  Administered 2023-01-15: 5 mg via INTRAVENOUS
  Filled 2023-01-15: qty 5

## 2023-01-15 NOTE — Progress Notes (Signed)
   01/15/23 2315  Therapy Vitals  Pulse Rate (!) 129  Resp 20  BP 107/77  MEWS Score/Color  MEWS Score 2  MEWS Score Color Yellow  Respiratory Assessment  Assessment Type Assess only  Respiratory Pattern Labored;Tachypnea  Chest Assessment Chest expansion symmetrical  Cough Non-productive  Sputum Amount None  Sputum Specimen Source Spontaneous cough  Bilateral Breath Sounds Clear;Diminished  Oxygen Therapy/Pulse Ox  O2 Device (S)  Bi-PAP  O2 Therapy Oxygen  FiO2 (%) 40 %  SpO2 97 %   Pt had increased work of breathing with desaturation. Pt placed back on bipap.

## 2023-01-15 NOTE — Progress Notes (Signed)
   01/15/23 2310  Therapy Vitals  Pulse Rate (!) 125  Resp (!) 22  MEWS Score/Color  MEWS Score 4  MEWS Score Color Red  Respiratory Assessment  Assessment Type Assess only  Respiratory Pattern Regular;Unlabored  Chest Assessment Chest expansion symmetrical  Cough Non-productive  Sputum Specimen Source Spontaneous cough  Bilateral Breath Sounds Clear;Diminished  Oxygen Therapy/Pulse Ox  O2 Device (S)  HFNC  $ High Flow Nasal Cannula  Yes  O2 Therapy Oxygen  Heater temperature 91.4 F (33 C)  O2 Flow Rate (L/min) 35 L/min  FiO2 (%) 50 %  SpO2 97 %   Pt placed on HHFNC at 35L/50%. Pt tolerating well at this time.

## 2023-01-15 NOTE — Progress Notes (Signed)
NAME:  [name], MRN:  [id number], DOB:  11-Jun-1940, LOS: 11 ADMISSION DATE:  01/04/2023, CONSULTATION DATE: 01/08/2023 REFERRING MD: Dr. Jomarie Longs, CHIEF COMPLAINT: Right pleural effusion  History of Present Illness:  82 F, history of A-fib, DVT/PE, Takotsubo cardiomyopathy with associated systolic CHF, CVA with residual deficits.  Admitted with weakness, dyspnea, lower extremity edema and encephalopathy on 7/27. Has been treated for volume overload but only moderate response to diuretics.  Right lower lobe infiltrate and severe volume loss consistent with pneumonia/atelectasis with coexisting right pleural effusion.  Underwent right thoracentesis with 1.1 L removed 8/1.  Then bronchoscopy on 8/2 for secretion clearance to hopefully resolve some of her right lower lobe and right middle lobe atelectasis.  She was intubated for that procedure, able to be extubated afterwards on 8/2.  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  Extubated postprocedure 8/5 recurrent total right atelectasis, increased respiratory  needs >> BiPAP  Interim History / Subjective:  Has required continuous BiPAP for the last 24 hours, tachypnea and tachycardia when she is tried back on high flow nasal cannula I/O +1 L total Atrial fibrillation, amiodarone 30 Norepinephrine 2   Objective   Blood pressure (!) 138/104, pulse (!) 121, temperature 99 F (37.2 C), temperature source Axillary, resp. rate (!) 31, height 5\' 2"  (1.575 m), weight 85.4 kg, SpO2 95%.    FiO2 (%):  [35 %-100 %] 50 % PEEP:  [8 cmH20] 8 cmH20 Pressure Support:  [15 cmH20] 15 cmH20   Intake/Output Summary (Last 24 hours) at 01/15/2023 1050 Last data filed at 01/15/2023 1000 Gross per 24 hour  Intake 1737.39 ml  Output 1000 ml  Net 737.39 ml   Filed Weights   01/13/23 0500 01/14/23 0500 01/15/23 0500  Weight: 85.4 kg 85.4 kg 85.4 kg    Examination: General: Weak, acute and chronically ill-appearing elderly woman, just transitioned back to high flow nasal cannula HENT: Oropharynx dry, strong voice without upper airway secretions Lungs: Poor right-sided air movement with rhonchi, will clear on the left Cardiovascular: Irregularly irregular, 120-130 Abdomen: Mildly distended, hypoactive bowel sounds Extremities: No right lower extreme edema, left side draped Neuro: Awake, answers questions, follows commands, moves all extremities.  Resolved Hospital Problem list     Assessment & Plan:   82 year old woman with acute on chronic hypoxemic respiratory failure due to systolic and diastolic CHF (with valvular disease and A-fib), right lower lobe and right middle lobe collapse and presumed pneumonia, right pleural effusion  Acute on chronic hypoxemic respiratory failure.  Had rebounded some after bronchoscopy/thoracentesis but backtrack 8/5 with total right atelectasis -Has continued require BiPAP.  Right upper lobe is better aerated on chest x-ray from 8/7.  Will attempt to transition back to high flow nasal cannula if possible.  Unsure that she will  be able to maintain off positive pressure. -Work hard on Financial planner, pulmonary hygiene -Given her oxygen needs unclear that bronchoscopy is possible without reintubation which I think would be contraindicated.  If she were reintubated there would be very high risk that she could not be successfully extubated, would become vent dependent.  Continuing to discussed with family GOC -Continue antibiotics as below  R PNA Right pleural effusion, thoracentesis 8/1.  Labs suggest a transudate although serum LDH was not obtained (protein <3).  Multifactorial from her pneumonia and her CHF -Back on Unasyn on 8/6 when she decompensated.  Completed full course ceftriaxone/azithromycin -No clear benefit to be gained from repeat right thoracentesis given transudative contribution and proximal right lung obstruction and collapse -Continue to follow culture data unrevealing so far, unrevealing so far except for few colonies of Candida  Shock, suspect in part due to diuresis in the setting of her systolic CHF but also concerning for possible component sepsis given progressive right-sided infiltrate, possible aspiration -Working to wean norepinephrine -Careful with IV fluid boluses given her history of CHF  Diastolic and systolic CHF Paroxysmal atrial fibrillation, adequate rate control -Appreciate cardiology assistance -IV amiodarone, plan for 150 mg bolus 8/7 -Scheduled metoprolol on hold, will give IV for rate control -Digoxin stopped 8/5 -Anticoagulation stopped due to n.p.o. status  Hyponatremia, improved -Follow BMP, urine output  Disposition: ICU  Discussed plans and status with the patient and daughter at bedside on 8/4.  Family updated 8/5 when she went back on BiPAP by Dr. Chestine Spore.   Dr Delton Coombes updated pt's daughter [name] 8/6 regarding last 24h, level of support. Have explained that she may not survive this illness, that we need to consider limitations on her care especially MV from which she  would likely not be liberated / survive.   Critical care time 35 minutes   Levy Pupa, MD, PhD 01/15/2023, 10:50 AM [facility] [phone] or if no answer before 7:00PM call [phone] For any issues after 7:00PM please call eLink [phone]

## 2023-01-15 NOTE — Progress Notes (Signed)
Pt unable to tolerate coming off BIPAP to HFNC. DeSpO2 to low 80s, BIPAP back on pt

## 2023-01-15 NOTE — Progress Notes (Signed)
PT Cancellation Note  Patient Details Name: [name] MRN: [id number] DOB: 06-21-1940   Cancelled Treatment:    Reason Eval/Treat Not Completed: (P) Medical issues which prohibited therapy, pt back on bipap, sleeping on arrival with HR up to 137bpm at rest. Will hold therapy this date and check back tomorrow to continue with PT POC.   Lenora Boys. PTA Acute Rehabilitation Services Office: [phone]    Catalina Antigua 01/15/2023, 2:32 PM

## 2023-01-15 NOTE — Progress Notes (Signed)
Progress Note  Patient Name: [name] Date of Encounter: 01/15/2023 Primary Cardiologist: Sherilyn Cooter [facility] ([name])  Subjective   Worsening SOB.  Presently not planned for procedural intervention. Notes that she thinks presently she will either get a bronchoscopy or she is going to pass away.  Vital Signs    Vitals:   01/15/23 0700 01/15/23 0715 01/15/23 0730 01/15/23 0733  BP: 108/74 102/69 111/60   Pulse: (!) 117 (!) 101 (!) 101   Resp: (!) 24 (!) 24 (!) 24   Temp:    99 F (37.2 C)  TempSrc:    Axillary  SpO2: 100% 100% 100%   Weight:      Height:        Intake/Output Summary (Last 24 hours) at 01/15/2023 0752 Last data filed at 01/15/2023 0600 Gross per 24 hour  Intake 1768.49 ml  Output 1150 ml  Net 618.49 ml   Filed Weights   01/13/23 0500 01/14/23 0500 01/15/23 0500  Weight: 85.4 kg 85.4 kg 85.4 kg    Physical Exam   GEN: acute distress.  Ill appearing Neck:  JVD + Cardiac: IRIR tachycardia, distant heart sounds systolic murmur  Respiratory: Coarse breath sounds bilaterally GI: Soft, nontender, non-distended  MS: No edema  Labs   Telemetry: AF RVR with triplets and NSVT   Chemistry Recent Labs  Lab 01/10/23 1007 01/10/23 1310 01/13/23 0904 01/13/23 1743 01/14/23 0022 01/14/23 0107 01/15/23 0632  NA 137   < > 133*   < > 133* 133* 135  K 3.7   < > 4.0   < > 4.1 4.7 5.1  CL 84*   < > 89*  --   --  88* 92*  CO2 41*   < > 32  --   --  32 27  GLUCOSE 130*   < > 153*  --   --  76 117*  BUN 14   < > 10  --   --  14 7*  CREATININE 0.83   < > 0.66  --   --  0.76 0.63  CALCIUM 9.3   < > 9.3  --   --  9.2 8.8*  PROT 6.6  --   --   --   --   --   --   ALBUMIN 2.8*  --   --   --   --   --   --   AST 41  --   --   --   --   --   --   ALT 27  --   --   --   --   --   --   ALKPHOS 136*  --   --   --   --   --   --   BILITOT 0.9  --   --   --   --   --   --   GFRNONAA >60   < > >60  --   --  >60 >60  ANIONGAP 12   < > 12  --   --  13 16*    < > = values in this interval not displayed.     Hematology Recent Labs  Lab 01/12/23 0033 01/13/23 1743 01/14/23 0022 01/14/23 0107 01/15/23 0632  WBC 10.1  --   --  9.6 8.9  RBC 4.30  --   --  4.39 4.35  HGB 12.5   < > 13.6 13.1 12.6  HCT 40.0   < >  40.0 42.6 39.9  MCV 93.0  --   --  97.0 91.7  MCH 29.1  --   --  29.8 29.0  MCHC 31.3  --   --  30.8 31.6  RDW 14.8  --   --  14.7 14.8  PLT 192  --   --  126* 199   < > = values in this interval not displayed.    Cardiac Studies   Cardiac Studies & Procedures       ECHOCARDIOGRAM  ECHOCARDIOGRAM COMPLETE 01/05/2023  Narrative ECHOCARDIOGRAM REPORT    Patient Name:   [name] Date of Exam: 01/05/2023 Medical Rec #:  [id number]      Height:       62.0 in Accession #:    [id number]     Weight:       194.7 lb Date of Birth:  [birth year]      BSA:          1.890 m Patient Age:    82 years       BP:           102/63 mmHg Patient Gender: F              HR:           102 bpm. Exam Location:  Inpatient  Procedure: 2D Echo, Cardiac Doppler and Color Doppler  Indications:    acute systolic chf  History:        Patient has prior history of Echocardiogram examinations, most recent 07/26/2013. History of Takotsubo, Arrythmias:Atrial Fibrillation; Signs/Symptoms:Shortness of Breath.  Sonographer:    Delcie Roch RDCS Referring Phys: [id number] CHING T TU  IMPRESSIONS   1. Left ventricular ejection fraction, by estimation, is 55% with beat to beat variability. The left ventricle has normal function. The left ventricle has no regional wall motion abnormalities. There is mild left ventricular hypertrophy. Left ventricular diastolic parameters are indeterminate. 2. Right ventricular systolic function is mildly reduced. The right ventricular size is moderately enlarged. There is moderately elevated pulmonary artery systolic pressure. The estimated right ventricular systolic pressure is 50.5 mmHg. 3. Left atrial size was  severely dilated. 4. Right atrial size was severely dilated. 5. The mitral valve is degenerative. Severe mitral valve regurgitation. No evidence of mitral stenosis. Severe mitral annular calcification. 6. 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 7. The aortic valve is tricuspid. There is mild calcification of the aortic valve. Aortic valve regurgitation is mild. No aortic stenosis is present. 8. The inferior vena cava is dilated in size with <50% respiratory variability, suggesting right atrial pressure of 15 mmHg.  FINDINGS Left Ventricle: Left ventricular ejection fraction, by estimation, is 55%. The left ventricle has normal function. The left ventricle has no regional wall motion abnormalities. The left ventricular internal cavity size was normal in size. There is mild left ventricular hypertrophy. Left ventricular diastolic parameters are indeterminate.  Right Ventricle: The right ventricular size is moderately enlarged. No increase in right ventricular wall thickness. Right ventricular systolic function is mildly reduced. There is moderately elevated pulmonary artery systolic pressure. The tricuspid regurgitant velocity is 2.98 m/s, and with an assumed right atrial pressure of 15 mmHg, the estimated right ventricular systolic pressure is 50.5 mmHg.  Left Atrium: Left atrial size was severely dilated.  Right Atrium: Right atrial size was severely dilated.  Pericardium: There is no evidence of pericardial effusion.  Mitral Valve: The mitral valve is degenerative in appearance. Severe mitral annular calcification. Severe mitral valve regurgitation. No  evidence of mitral valve stenosis. The mean mitral valve gradient is 3.0 mmHg with average heart rate of 101 bpm.  Tricuspid Valve: The tricuspid valve is normal in structure. 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 No evidence of tricuspid stenosis.  Aortic Valve: The aortic valve is tricuspid. There is mild calcification of the aortic  valve. Aortic valve regurgitation is mild. Aortic regurgitation PHT measures 451 msec. No aortic stenosis is present.  Pulmonic Valve: The pulmonic valve was normal in structure. Pulmonic valve regurgitation is trivial. No evidence of pulmonic stenosis.  Aorta: The aortic root is normal in size and structure.  Venous: The inferior vena cava is dilated in size with less than 50% respiratory variability, suggesting right atrial pressure of 15 mmHg. The inferior vena cava and the hepatic vein show a pattern of systolic flow reversal, suggestive of tricuspid regurgitation.  IAS/Shunts: No atrial level shunt detected by color flow Doppler.  Additional Comments: There is pleural effusion in the right lateral region.   LEFT VENTRICLE PLAX 2D LVIDd:         4.80 cm LVIDs:         3.80 cm LV PW:         1.10 cm LV IVS:        1.20 cm LVOT diam:     2.10 cm LVOT Area:     3.46 cm   RIGHT VENTRICLE             IVC RV Basal diam:  3.60 cm     IVC diam: 2.60 cm RV S prime:     10.90 cm/s TAPSE (M-mode): 1.1 cm  LEFT ATRIUM              Index        RIGHT ATRIUM           Index LA diam:        5.10 cm  2.70 cm/m   RA Area:     29.70 cm LA Vol (A2C):   126.0 ml 66.67 ml/m  RA Volume:   104.00 ml 55.03 ml/m LA Vol (A4C):   94.0 ml  49.74 ml/m LA Biplane Vol: 109.0 ml 57.68 ml/m AORTIC VALVE AI PHT:      451 msec  AORTA Ao Root diam: 2.80 cm Ao Asc diam:  3.60 cm  MITRAL VALVE           TRICUSPID VALVE MV Mean grad: 3.0 mmHg TR Peak grad:   35.5 mmHg TR Vmax:        298.00 cm/s  SHUNTS Systemic Diam: 2.10 cm  Weston Brass MD Electronically signed by Weston Brass MD Signature Date/Time: 01/05/2023/8:37:43 PM    Final                  Assessment & Plan   Acute on chronic respiratory failure with hypoxia and hypercapnia Acute on chronic HFpEF with moderate right pleural effusion - CAP s/p bronch is component of her disease, re-accumulation vs repeat aspiration  PNA - symptomatic hypotension s/p bumex will hold diuretics, now on vasopressors again  Paroxysmal atrial fibrillation Hypotension requiring midodrine and/or pressors - her heparin is held presently; there are no plans for further procedures and planned for a more conservative GOC, her AC has not been restarted - trial of IV metoprolol is reasonable - Continue IV amio, received rebolus today  - holding digoxin prior to inability to take PO - holding metoprolol (cannot take PO)   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 Critical care time was exclusive of separately billable procedures and treating other patients. Critical care was necessary to treat or prevent imminent or life-threatening deterioration. Critical care was time spent personally by me on the following activities: development of treatment plan with patient and/or surrogate as well as nursing, discussions with consultants, evaluation of patient's response to treatment, examination of patient, obtaining history from patient or surrogate, ordering and performing treatments and interventions, ordering and review of laboratory studies, ordering and review of radiographic studies, pulse oximetry and re-evaluation of patient's condition.    Signed, Riley Lam, MD FASE [facility]  [facility]  01/15/2023 7:55 AM    For questions or updates, please contact [facility] Please consult [email or web address] for contact info under Cardiology/STEMI.      Riley Lam, MD FASE [facility] Cardiologist [facility]  [address] [place],  [address] [place], [place] [address] [phone]  7:52 AM

## 2023-01-15 NOTE — Progress Notes (Signed)
Nutrition Follow-up  DOCUMENTATION CODES:   Not applicable  INTERVENTION:  Monitor GOC discussions If pt remains full scope of care, recommend placement of post-pyloric cortrak so pt can be fed and have medications provided Recommend the following TF regimen: Osmolite 1.2 @ 76mL/h (1323mL/d) Start at 25 and advance by 10mL q6h to goal This will provide 1584 kcal, 73g protein, and of free water  NUTRITION DIAGNOSIS:  Increased nutrient needs related to wound healing as evidenced by estimated needs.  GOAL:  Patient will meet greater than or equal to 90% of their needs  MONITOR:  PO intake, Supplement acceptance, I & O's, Labs  REASON FOR ASSESSMENT:   (Pressure Injury)    ASSESSMENT:  Pt with hx of CHF, previous stroke, PAF, and DVT presented to ED with SOB worsening over the last week. Found to have CHF exacerbation. Of note, pt with wounds and edema to the BLE that are cared for by home health.  8/1 - right thoracentesis, 1.1L of bloody fluid drained 8/2 - intubated, bronchoscopy, extubated 8/5 - respiratory decline, pt made NPO and requiring BiPAP  Pt remains on BiPAP support which she has needed continuously over the last 24 hours. Per CCM, develops tachypnea and tachycardia when attempting to change back to HFNC.   Discussed with MD that if pt plans to remain with full scope of care, would recommend placement of post-pyloric cortrak for medication administration and nutrition as she has been without for 2 days. MD plans to discuss GOC with family again today, they are aware of pt's poor prognosis. If family elects to continue full scope, can place consult for cortrak tube and enteral feeds. Will leave TF recommendations if they are needed.   Average Meal Intake: 7/30-8/4: 84% intake x 5 recorded meals  Nutritionally Relevant Medications: Scheduled Meds:  calcium carbonate  1 tablet BID WC   cholecalciferol  1,000 Units Daily   cyanocobalamin  1,000 mcg Daily    docusate sodium  100 mg Daily   multivitamin with minerals  1 tablet Daily   (JUVEN)  1 packet BID BM   (PROTONIX) IV  40 mg QHS   polyethylene glycol  17 g Daily   senna-docusate  1 tablet BID   Continuous Infusions:  amiodarone 30 mg/hr (01/15/23 1200)   ampicillin-sulbactam (UNASYN) IV Stopped (01/15/23 1000)   norepinephrine (LEVOPHED) Adult infusion Stopped (01/15/23 1122)   sodium phosphate 30 mmol in dextrose 5 % 250 mL infusion     PRN Meds: ondansetron  Labs Reviewed: Chloride 92 Phosphorus 2.0 CBG ranges from 80-124 mg/dL over the last 24 hours HgbA1c 6.5%  NUTRITION - FOCUSED PHYSICAL EXAM: Flowsheet Row Most Recent Value  Orbital Region Mild depletion  Upper Arm Region No depletion  Thoracic and Lumbar Region No depletion  Buccal Region No depletion  Temple Region No depletion  Clavicle Bone Region No depletion  Clavicle and Acromion Bone Region Mild depletion  Scapular Bone Region No depletion  Dorsal Hand No depletion  Patellar Region No depletion  Anterior Thigh Region No depletion  Posterior Calf Region No depletion  [edema]  Edema (RD Assessment) Moderate  [BLE]  Hair Reviewed  Eyes Reviewed  Mouth Reviewed  Skin Reviewed  Nails Reviewed    Diet Order:   Diet Order             Diet NPO time specified  Diet effective now  EDUCATION NEEDS:  Not appropriate for education at this time  Skin:  Skin Assessment: Reviewed RN Assessment Skin tear:  - left buttocks Stage 2: - left tibial area   Last BM:  8/4 - type 4  Height:  Ht Readings from Last 1 Encounters:  01/04/23 5\' 2"  (1.575 m)    Weight:  Wt Readings from Last 1 Encounters:  01/15/23 85.4 kg    Ideal Body Weight:  50 kg  BMI:  Body mass index is 34.44 kg/m.  Estimated Nutritional Needs:  Kcal:  1500-1700 kcal/d Protein:  75-90g/d Fluid:  1.5-1.7L/d    Greig Castilla, RD, LDN Clinical Dietitian RD pager # available in [facility]  After  hours/weekend pager # available in [facility]

## 2023-01-16 ENCOUNTER — Inpatient Hospital Stay (HOSPITAL_COMMUNITY): Payer: Medicare Other

## 2023-01-16 DIAGNOSIS — J9622 Acute and chronic respiratory failure with hypercapnia: Secondary | ICD-10-CM | POA: Diagnosis not present

## 2023-01-16 DIAGNOSIS — J90 Pleural effusion, not elsewhere classified: Secondary | ICD-10-CM | POA: Diagnosis not present

## 2023-01-16 DIAGNOSIS — J9621 Acute and chronic respiratory failure with hypoxia: Secondary | ICD-10-CM | POA: Diagnosis not present

## 2023-01-16 LAB — ALBUMIN: Albumin: 2.4 g/dL — ABNORMAL LOW (ref 3.5–5.0)

## 2023-01-16 LAB — GLUCOSE, PLEURAL OR PERITONEAL FLUID: Glucose, Fluid: 121 mg/dL

## 2023-01-16 LAB — BODY FLUID CELL COUNT WITH DIFFERENTIAL
Eos, Fluid: 1 %
Lymphs, Fluid: 21 %
Monocyte-Macrophage-Serous Fluid: 23 % — ABNORMAL LOW (ref 50–90)
Neutrophil Count, Fluid: 55 % — ABNORMAL HIGH (ref 0–25)
Total Nucleated Cell Count, Fluid: 1615 cu mm — ABNORMAL HIGH (ref 0–1000)

## 2023-01-16 LAB — LACTATE DEHYDROGENASE: LDH: 202 U/L — ABNORMAL HIGH (ref 98–192)

## 2023-01-16 LAB — ALBUMIN, PLEURAL OR PERITONEAL FLUID: Albumin, Fluid: <1.5 g/dL

## 2023-01-16 LAB — BODY FLUID CULTURE W GRAM STAIN

## 2023-01-16 LAB — PROTEIN, TOTAL: Total Protein: 6.2 g/dL — ABNORMAL LOW (ref 6.5–8.1)

## 2023-01-16 LAB — PROTEIN, PLEURAL OR PERITONEAL FLUID
Total protein, fluid: <3 g/dL
Total protein, fluid: <3 g/dL

## 2023-01-16 LAB — CHOLESTEROL, TOTAL: Cholesterol: 100 mg/dL (ref 0–200)

## 2023-01-16 LAB — GLUCOSE, CAPILLARY
Glucose-Capillary: 106 mg/dL — ABNORMAL HIGH (ref 70–99)
Glucose-Capillary: 114 mg/dL — ABNORMAL HIGH (ref 70–99)

## 2023-01-16 LAB — LACTATE DEHYDROGENASE, PLEURAL OR PERITONEAL FLUID
LD, Fluid: 140 U/L — ABNORMAL HIGH (ref 3–23)
LD, Fluid: 111 U/L — ABNORMAL HIGH (ref 3–23)

## 2023-01-16 LAB — PHOSPHORUS: Phosphorus: 3 mg/dL (ref 2.5–4.6)

## 2023-01-16 MED ORDER — ACETAMINOPHEN 325 MG PO TABS
650.0000 mg | ORAL_TABLET | Freq: Four times a day (QID) | ORAL | Status: DC | PRN
  Administered 2023-01-16 – 2023-01-17 (×2): 650 mg via ORAL
  Filled 2023-01-16 (×2): qty 2

## 2023-01-16 MED ORDER — MAGNESIUM SULFATE 2 GM/50ML IV SOLN
2.0000 g | Freq: Once | INTRAVENOUS | Status: AC
  Administered 2023-01-16: 2 g via INTRAVENOUS
  Filled 2023-01-16: qty 50

## 2023-01-16 MED ORDER — OLANZAPINE 10 MG IM SOLR
2.5000 mg | Freq: Once | INTRAMUSCULAR | Status: DC | PRN
  Filled 2023-01-16: qty 10

## 2023-01-16 MED ORDER — POTASSIUM CHLORIDE 10 MEQ/100ML IV SOLN
10.0000 meq | INTRAVENOUS | Status: AC
  Administered 2023-01-16 (×6): 10 meq via INTRAVENOUS
  Filled 2023-01-16 (×6): qty 100

## 2023-01-16 MED ORDER — POTASSIUM CHLORIDE 20 MEQ PO PACK
40.0000 meq | PACK | Freq: Once | ORAL | Status: AC
  Administered 2023-01-16: 40 meq via ORAL
  Filled 2023-01-16: qty 2

## 2023-01-16 MED ORDER — SODIUM CHLORIDE 0.9% FLUSH
10.0000 mL | Freq: Three times a day (TID) | INTRAVENOUS | Status: DC
  Administered 2023-01-16 – 2023-01-20 (×11): 10 mL via INTRAPLEURAL

## 2023-01-16 NOTE — Progress Notes (Signed)
PT Cancellation Note  Patient Details Name: [name] MRN: [id number] DOB: [birth year]   Cancelled Treatment:    Reason Eval/Treat Not Completed: (P) Medical issues which prohibited therapy, per RN pt with recent L thoracentesis and R chest tube placement and pt resting comfortably, will check back tomorrow to continue with PT POC.  Lenora Boys. PTA Acute Rehabilitation Services Office: [phone]    Catalina Antigua 01/16/2023, 3:39 PM

## 2023-01-16 NOTE — Progress Notes (Addendum)
eLink Physician-Brief Progress Note Patient Name: [name] DOB: 09-07-1940 MRN: [id number]   Date of Service  01/16/2023  HPI/Events of Note  82 year old woman with acute on chronic hypoxemic respiratory failure due to systolic and diastolic CHF (with valvular disease and A-fib), right lower lobe and right middle lobe collapse and presumed pneumonia, right pleural effusion   Had a sudden onset increased work of breathing, dropping sats and decreased breath sounds.  She was repositioned in bed with an improvement.  Still moderately distressed on examination.  Synchronous with the ventilator with adequate oxygenation and ventilation on BiPAP  eICU Interventions  Will obtain chest radiograph  Has been having intermittent anxiety and is pulling at her BiPAP.  If no major interventions indicated on chest radiograph, will attempt low-dose olanzapine.   0309 -increased edema and effusion.  No pneumothorax.  No obvious thoracic injury.  Continue to utilize BiPAP for support, consider diuretic although borderline pressures for now, and utilize olanzapine as needed.  Intervention Category Major Interventions: Respiratory failure - evaluation and management  Shunsuke Granzow 01/16/2023, 2:53 AM

## 2023-01-16 NOTE — Progress Notes (Signed)
NAME:  [name], MRN:  [id number], DOB:  25-Jun-1940, LOS: 12 ADMISSION DATE:  01/04/2023, CONSULTATION DATE: 01/08/2023 REFERRING MD: Dr. Jomarie Longs, CHIEF COMPLAINT: Right pleural effusion  History of Present Illness:  82 F, history of A-fib, DVT/PE, Takotsubo cardiomyopathy with associated systolic CHF, CVA with residual deficits.  Admitted with weakness, dyspnea, lower extremity edema and encephalopathy on 7/27. Has been treated for volume overload but only moderate response to diuretics.  Right lower lobe infiltrate and severe volume loss consistent with pneumonia/atelectasis with coexisting right pleural effusion.  Underwent right thoracentesis with 1.1 L removed 8/1.  Then bronchoscopy on 8/2 for secretion clearance to hopefully resolve some of her right lower lobe and right middle lobe atelectasis.  She was intubated for that procedure, able to be extubated afterwards on 8/2.  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  Extubated postprocedure 8/5 recurrent total right atelectasis, increased respiratory  needs >> BiPAP  Interim History / Subjective:  Did some high flow nasal cannula 8/7, then back to BiPAP.  She was uncomfortable, tried to take it off with some anxiety but had significant desaturations off of it.  Olanzapine was given  Chest x-ray with bilateral pleural effusions, some better aeration of the right upper lobe  Poor urine output last 24 hours, I/O+ 2.1 L total  Remains in atrial fibrillation, intermittently with RVR depending on her work of breathing and respiratory status.  Pressors are off  Objective   Blood pressure 102/72, pulse (!) 113, temperature 98.5 F (36.9 C), temperature source Axillary, resp. rate (!) 21, height 5\' 2"  (1.575 m), weight 85.5 kg, SpO2 95%.    FiO2 (%):  [40 %-100 %] 40 % PEEP:  [8 cmH20] 8 cmH20 Pressure Support:  [12 cmH20-15 cmH20] 12 cmH20   Intake/Output Summary (Last 24 hours) at 01/16/2023 0719 Last data filed at 01/16/2023 0700 Gross per 24 hour  Intake 1596.5 ml  Output 150 ml  Net 1446.5 ml   Filed Weights   01/14/23 0500 01/15/23 0500 01/16/23 0500  Weight: 85.4 kg 85.4 kg 85.5 kg    Examination: General: Elderly woman laying in bed in no distress on high flow nasal cannula HENT: Strong voice, upper airway clear Lungs: Rhonchi bilaterally, right greater than left Cardiac: Irregularly irregular, 120s Abdomen: Hypoactive bowel sounds, nondistended Extremities: No right lower extremity edema, left lower extremity is wrapped Neuro: Awake and alert, following commands, moves all extremities.  Resolved Hospital Problem list     Assessment & Plan:   82 year old woman with acute on chronic hypoxemic respiratory failure due to systolic and diastolic CHF (with valvular disease and A-fib), right lower lobe and right middle lobe collapse and presumed pneumonia,  right pleural effusion  Acute on chronic hypoxemic respiratory failure.  Had rebounded some after bronchoscopy/thoracentesis but backtrack 8/5 with total right  atelectasis -Requiring BiPAP, has tolerated hyponasal cannula only for short periods of time over the last few days.  Right upper lobe a bit better aerated but now with reaccumulating bilateral pleural effusions. -Consider repeat thoracenteses 8/8.  Will discuss with patient and family -Continue to work hard on Financial [employer] pulmonary hygiene -Given her oxygen needs repeat bronchoscopy would not be tolerated without intubation which I would avoid. -Will continue to work on GOC discussions with family and patient.  I do not believe she should be reintubated as there is a high likelihood that she would not be liberated from MV -Antibiotics as below  R PNA Right pleural effusion, thoracentesis 8/1.  Labs suggest a transudate although serum LDH was not obtained (protein <3).  Multifactorial from her pneumonia and her CHF -Back on Unasyn on 8/6 when she backtracked, did complete full course ceftriaxone/azithromycin -Considering repeat thoracentesis as above -Following culture data, unrevealing  Shock, suspect in part due to diuresis in the setting of her systolic CHF but also concerning for possible component sepsis given progressive right-sided infiltrate, possible aspiration -Improved, currently off norepinephrine -Careful with IV fluid bolusing given her history of CHF  Diastolic and systolic CHF Paroxysmal atrial fibrillation, adequate rate control -Appreciate cardiology assistance -IV amiodarone since she is back on BiPAP, tolerating p.o. poorly -Depending on blood pressure we will try to add back rate control, no room to do so at this time -Digoxin was stopped on 8/5 -Anticoagulation held due to her n.p.o. status  Hyponatremia, improved -Follow urine output and BMP  Disposition: ICU  Revisited GOC with the patient at bedside today 8/8.  I have recommended that we continue to do all medical interventions, noninvasive care.  Explained also that she would likely not tolerate  intubation, mechanical ventilation, CPR/ACLS.  Rationale is that she would not survive these interventions to a good quality of life.  She understands and agrees.  I will place DNR orders on the chart and review our discussions with her family.   Critical care time 34 minutes   Levy Pupa, MD, PhD 01/16/2023, 7:19 AM [facility] [phone] or if no answer before 7:00PM call [phone] For any issues after 7:00PM please call eLink [phone]

## 2023-01-16 NOTE — Progress Notes (Signed)
OT Cancellation Note  Patient Details Name: DAVAN LACOSSE MRN: [id number] DOB: 1940/08/01   Cancelled Treatment:    Reason Eval/Treat Not Completed: Medical issues which prohibited therapy (per RN pt with recent L thoracentesis and R chest tube placement and pt resting comfortably, will check back tomorrow to continue with PT POC.)  Tyler Deis, OTR/L [facility] Acute Rehabilitation Office: [phone]   Myrla Halsted 01/16/2023, 3:46 PM

## 2023-01-16 NOTE — Procedures (Signed)
Thoracentesis  Procedure Note  [name]  [id number]  21-Aug-1940  Date:01/16/23  Time:10:30 AM   Provider Performing:Pete E Tanja Port   Procedure: Thoracentesis with imaging guidance ([id number])  Indication(s) Pleural Effusion  Consent Risks of the procedure as well as the alternatives and risks of each were explained to the patient and/or caregiver.  Consent for the procedure was obtained and is signed in the bedside chart  Anesthesia Topical only with 1% lidocaine    Time Out Verified patient identification, verified procedure, site/side was marked, verified correct patient position, special equipment/implants available, medications/allergies/relevant history reviewed, required imaging and test results available.   Sterile Technique Maximal sterile technique including full sterile barrier drape, hand hygiene, sterile gown, sterile gloves, mask, hair covering, sterile ultrasound probe cover (if used).  Procedure Description Ultrasound was used to identify appropriate pleural anatomy for placement and overlying skin marked.  Area of drainage cleaned and draped in sterile fashion. Lidocaine was used to anesthetize the skin and subcutaneous tissue.  500 cc's of rust colored  appearing fluid was drained from the left pleural space. Catheter then removed and bandaid applied to site.   Complications/Tolerance None; patient tolerated the procedure well. Chest X-ray is ordered to confirm no post-procedural complication.   EBL Minimal   Specimen(s) Pleural fluid  Simonne Martinet ACNP-BC [facility] Pulmonary/Critical Care Pager # [phone] OR # [phone] if no answer

## 2023-01-16 NOTE — Progress Notes (Signed)
Progress Note  Patient Name: [name] Date of Encounter: 01/16/2023 Primary Cardiologist: Sherilyn Cooter [facility] (Ms. [name])  Subjective   Worsening SOB.   Worsening effusion and tachypnea. Weaned of pressors. Feels about the same.  Vital Signs    Vitals:   01/16/23 0615 01/16/23 0630 01/16/23 0700 01/16/23 0752  BP: 101/79 92/62 102/72   Pulse: (!) 124 (!) 133 (!) 113 (!) 110  Resp: (!) 24 (!) 24 (!) 21 (!) 24  Temp:      TempSrc:      SpO2: 96% 93% 95% 98%  Weight:      Height:        Intake/Output Summary (Last 24 hours) at 01/16/2023 0800 Last data filed at 01/16/2023 0700 Gross per 24 hour  Intake 1505.35 ml  Output 150 ml  Net 1355.35 ml   Filed Weights   01/14/23 0500 01/15/23 0500 01/16/23 0500  Weight: 85.4 kg 85.4 kg 85.5 kg    Physical Exam   GEN: acute distress.  Ill appearing Neck:  JVD + Cardiac: IRIR tachycardia, distant heart sounds systolic murmur  Respiratory: coarse breath sounds bilaterally GI: Soft, nontender, non-distended  MS: Non-pitting edema  Labs   Telemetry: AF RVR   Chemistry Recent Labs  Lab 01/10/23 1007 01/10/23 1310 01/14/23 0107 01/15/23 0632 01/16/23 0205  NA 137   < > 133* 135 134*  K 3.7   < > 4.7 5.1 3.2*  CL 84*   < > 88* 92* 91*  CO2 41*   < > 32 27 32  GLUCOSE 130*   < > 76 117* 94  BUN 14   < > 14 7* 7*  CREATININE 0.83   < > 0.76 0.63 0.65  CALCIUM 9.3   < > 9.2 8.8* 8.8*  PROT 6.6  --   --   --   --   ALBUMIN 2.8*  --   --   --   --   AST 41  --   --   --   --   ALT 27  --   --   --   --   ALKPHOS 136*  --   --   --   --   BILITOT 0.9  --   --   --   --   GFRNONAA >60   < > >60 >60 >60  ANIONGAP 12   < > 13 16* 11   < > = values in this interval not displayed.     Hematology Recent Labs  Lab 01/14/23 0107 01/15/23 0632 01/16/23 0205  WBC 9.6 8.9 6.5  RBC 4.39 4.35 4.00  HGB 13.1 12.6 11.4*  HCT 42.6 39.9 37.3  MCV 97.0 91.7 93.3  MCH 29.8 29.0 28.5  MCHC 30.8 31.6 30.6  RDW 14.7  14.8 14.8  PLT 126* 199 177    Cardiac Studies   Cardiac Studies & Procedures       ECHOCARDIOGRAM  ECHOCARDIOGRAM COMPLETE 01/05/2023  Narrative ECHOCARDIOGRAM REPORT    Patient Name:   [name] Date of Exam: 01/05/2023 Medical Rec #:  [id number]      Height:       62.0 in Accession #:    [id number]     Weight:       194.7 lb Date of Birth:  March 27, 1941      BSA:          1.890 m Patient Age:    82 years  BP:           102/63 mmHg Patient Gender: F              HR:           102 bpm. Exam Location:  Inpatient  Procedure: 2D Echo, Cardiac Doppler and Color Doppler  Indications:    acute systolic chf  History:        Patient has prior history of Echocardiogram examinations, most recent 07/26/2013. History of Takotsubo, Arrythmias:Atrial Fibrillation; Signs/Symptoms:Shortness of Breath.  Sonographer:    Delcie Roch RDCS Referring Phys: [id number] CHING T TU  IMPRESSIONS   1. Left ventricular ejection fraction, by estimation, is 55% with beat to beat variability. The left ventricle has normal function. The left ventricle has no regional wall motion abnormalities. There is mild left ventricular hypertrophy. Left ventricular diastolic parameters are indeterminate. 2. Right ventricular systolic function is mildly reduced. The right ventricular size is moderately enlarged. There is moderately elevated pulmonary artery systolic pressure. The estimated right ventricular systolic pressure is 50.5 mmHg. 3. Left atrial size was severely dilated. 4. Right atrial size was severely dilated. 5. The mitral valve is degenerative. Severe mitral valve regurgitation. No evidence of mitral stenosis. Severe mitral annular calcification. 6. 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 7. The aortic valve is tricuspid. There is mild calcification of the aortic valve. Aortic valve regurgitation is mild. No aortic stenosis is present. 8. The inferior vena cava is dilated in size with <50%  respiratory variability, suggesting right atrial pressure of 15 mmHg.  FINDINGS Left Ventricle: Left ventricular ejection fraction, by estimation, is 55%. The left ventricle has normal function. The left ventricle has no regional wall motion abnormalities. The left ventricular internal cavity size was normal in size. There is mild left ventricular hypertrophy. Left ventricular diastolic parameters are indeterminate.  Right Ventricle: The right ventricular size is moderately enlarged. No increase in right ventricular wall thickness. Right ventricular systolic function is mildly reduced. There is moderately elevated pulmonary artery systolic pressure. The tricuspid regurgitant velocity is 2.98 m/s, and with an assumed right atrial pressure of 15 mmHg, the estimated right ventricular systolic pressure is 50.5 mmHg.  Left Atrium: Left atrial size was severely dilated.  Right Atrium: Right atrial size was severely dilated.  Pericardium: There is no evidence of pericardial effusion.  Mitral Valve: The mitral valve is degenerative in appearance. Severe mitral annular calcification. Severe mitral valve regurgitation. No evidence of mitral valve stenosis. The mean mitral valve gradient is 3.0 mmHg with average heart rate of 101 bpm.  Tricuspid Valve: The tricuspid valve is normal in structure. 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 No evidence of tricuspid stenosis.  Aortic Valve: The aortic valve is tricuspid. There is mild calcification of the aortic valve. Aortic valve regurgitation is mild. Aortic regurgitation PHT measures 451 msec. No aortic stenosis is present.  Pulmonic Valve: The pulmonic valve was normal in structure. Pulmonic valve regurgitation is trivial. No evidence of pulmonic stenosis.  Aorta: The aortic root is normal in size and structure.  Venous: The inferior vena cava is dilated in size with less than 50% respiratory variability, suggesting right atrial pressure of 15 mmHg. The  inferior vena cava and the hepatic vein show a pattern of systolic flow reversal, suggestive of tricuspid regurgitation.  IAS/Shunts: No atrial level shunt detected by color flow Doppler.  Additional Comments: There is pleural effusion in the right lateral region.   LEFT VENTRICLE PLAX 2D LVIDd:  4.80 cm LVIDs:         3.80 cm LV PW:         1.10 cm LV IVS:        1.20 cm LVOT diam:     2.10 cm LVOT Area:     3.46 cm   RIGHT VENTRICLE             IVC RV Basal diam:  3.60 cm     IVC diam: 2.60 cm RV S prime:     10.90 cm/s TAPSE (M-mode): 1.1 cm  LEFT ATRIUM              Index        RIGHT ATRIUM           Index LA diam:        5.10 cm  2.70 cm/m   RA Area:     29.70 cm LA Vol (A2C):   126.0 ml 66.67 ml/m  RA Volume:   104.00 ml 55.03 ml/m LA Vol (A4C):   94.0 ml  49.74 ml/m LA Biplane Vol: 109.0 ml 57.68 ml/m AORTIC VALVE AI PHT:      451 msec  AORTA Ao Root diam: 2.80 cm Ao Asc diam:  3.60 cm  MITRAL VALVE           TRICUSPID VALVE MV Mean grad: 3.0 mmHg TR Peak grad:   35.5 mmHg TR Vmax:        298.00 cm/s  SHUNTS Systemic Diam: 2.10 cm  Weston Brass MD Electronically signed by Weston Brass MD Signature Date/Time: 01/05/2023/8:37:43 PM    Final                  Assessment & Plan   Acute on chronic respiratory failure with hypoxia and hypercapnia Acute on chronic HFpEF with moderate right pleural effusion - CAP s/p bronch is component of her disease, re-accumulation vs repeat aspiration PNA - is now off pressors - discussed with PCCM colleagues: now to DNR (agree), potentially thoracentesis today, is < 24 hours off pressors, if still doing well tomorrow 80 IV lasix X1  Paroxysmal atrial fibrillation Hypotension requiring midodrine and/or pressors - her heparin is held presently; there are no plans for further procedures and planned for a more conservative GOC, her AC has not been restarted - Trial of IV metoprolol is reasonable -  Continue IV amio, received rebolus today  - holding digoxin prior to inability to take PO - holding metoprolol (cannot take PO)   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 Critical care time was exclusive of separately billable procedures and treating other patients. Critical care was necessary to treat or prevent imminent or life-threatening deterioration. Critical care was time spent personally by me on the following activities: development of treatment plan with patient and/or surrogate as well as nursing, discussions with consultants, evaluation of patient's response to treatment, examination of patient, obtaining history from patient or surrogate, ordering and performing treatments and interventions, ordering and review of laboratory studies, ordering and review of radiographic studies, pulse oximetry  and re-evaluation of patient's condition.    Signed, Riley Lam, MD FASE [facility]  [facility]  01/16/2023 8:00 AM     For questions or updates, please contact [facility] Please consult [email or web address] for contact info under Cardiology/STEMI.      Riley Lam, MD FASE [facility] Cardiologist [facility]  [address] [place], [address] [place], [place] [address] [phone]  8:00 AM

## 2023-01-16 NOTE — Progress Notes (Signed)
E-link RN informed of pt minimal urine output and to follow up w MD

## 2023-01-16 NOTE — Progress Notes (Signed)
Md called to bedside after pt RR in 40-50s, HR 140-150, lungs sounds checked (no sound in left lungs)New orders in

## 2023-01-16 NOTE — Procedures (Signed)
Insertion of Chest Tube Procedure Note  [name]  [id number]  [birth year]  Date:01/16/23  Time:12:13 PM    Provider Performing: Shelby Mattocks   Procedure: Chest Tube Insertion [phone])  Indication(s) Effusion  Consent Risks of the procedure as well as the alternatives and risks of each were explained to the patient and/or caregiver.  Consent for the procedure was obtained and is signed in the bedside chart  Anesthesia Topical only with 1% lidocaine    Time Out Verified patient identification, verified procedure, site/side was marked, verified correct patient position, special equipment/implants available, medications/allergies/relevant history reviewed, required imaging and test results available.   Sterile Technique Maximal sterile technique including full sterile barrier drape, hand hygiene, sterile gown, sterile gloves, mask, hair covering, sterile ultrasound probe cover (if used).   Procedure Description Ultrasound used to identify appropriate pleural anatomy for placement and overlying skin marked. Area of placement cleaned and draped in sterile fashion.  A 14 French pigtail pleural catheter was placed into the right pleural space using Seldinger technique. Appropriate return of fluid was obtained.  The tube was connected to atrium and placed on -20 cm H2O wall suction.   Complications/Tolerance None; patient tolerated the procedure well. Did have subscapular discomfort after about 1300 ml pleural fluid evacuated. I changed the tube to water seal and pain resolved.   Chest X-ray is ordered to verify placement.   EBL Minimal  Specimen(s) Pleural LDH, protein and culture sent

## 2023-01-16 NOTE — Progress Notes (Addendum)
eLink Physician-Brief Progress Note Patient Name: [name] DOB: 1940-09-20 MRN: [id number]   Date of Service  01/16/2023  HPI/Events of Note  Ongoing intermittent pain  eICU Interventions  Tylenol.   0147 - Requesting laxtive, last BM 8/4, already receiving Colace and senna.  Add MiraLAX.  0555 -pain is refractory to nonopiate options, will initiate oxycodone.  Aggressive bowel regimen in place.  Chest radiograph reviewed-chest tube in appropriate position with significant collapse of the right middle/lower lobes.  Would benefit from increased airway clearance therapy.  Intervention Category Minor Interventions: Routine modifications to care plan (e.g. PRN medications for pain, fever)  Rasheda Ledger 01/16/2023, 8:18 PM

## 2023-01-16 NOTE — Progress Notes (Signed)
Revision [facility] ADULT ICU REPLACEMENT PROTOCOL   The patient does apply for the [facility] Adult ICU Electrolyte Replacment Protocol based on the criteria listed below:   1.Exclusion criteria: TCTS, ECMO, Dialysis, and Myasthenia Gravis patients 2. Is GFR >/= 30 ml/min? Yes.    Patient's GFR today is >60 3. Is SCr </= 2? Yes.   Patient's SCr is 0.65 mg/dL 4. Did SCr increase >/= 0.5 in 24 hours? No. 5.Pt's weight >40kg  Yes.   6. Abnormal electrolyte(s): potassium 3.2, mag 1.8  7. Electrolytes replaced per protocol 8.  Call MD STAT for K+ </= 2.5, Phos </= 1, or Mag </= 1 Physician:  protocol  Melvern Banker 01/16/2023 3:12 AM

## 2023-01-17 ENCOUNTER — Inpatient Hospital Stay (HOSPITAL_COMMUNITY): Payer: Medicare Other

## 2023-01-17 DIAGNOSIS — J9621 Acute and chronic respiratory failure with hypoxia: Secondary | ICD-10-CM | POA: Diagnosis not present

## 2023-01-17 DIAGNOSIS — J9622 Acute and chronic respiratory failure with hypercapnia: Secondary | ICD-10-CM | POA: Diagnosis not present

## 2023-01-17 LAB — GLUCOSE, CAPILLARY: Glucose-Capillary: 93 mg/dL (ref 70–99)

## 2023-01-17 MED ORDER — OXYCODONE HCL 5 MG PO TABS
5.0000 mg | ORAL_TABLET | ORAL | Status: DC | PRN
  Administered 2023-01-17 – 2023-01-25 (×4): 5 mg via ORAL
  Filled 2023-01-17 (×5): qty 1

## 2023-01-17 MED ORDER — LACTULOSE ENEMA: 300.0000 mL | Freq: Once | ORAL | Status: DC | PRN

## 2023-01-17 MED ORDER — POLYETHYLENE GLYCOL 3350 17 G PO PACK
17.0000 g | PACK | Freq: Two times a day (BID) | ORAL | Status: DC
  Administered 2023-01-17 – 2023-01-29 (×11): 17 g via ORAL
  Filled 2023-01-17 (×17): qty 1

## 2023-01-17 MED ORDER — ENOXAPARIN SODIUM 100 MG/ML IJ SOSY
90.0000 mg | PREFILLED_SYRINGE | Freq: Two times a day (BID) | INTRAMUSCULAR | Status: DC
  Administered 2023-01-17 – 2023-01-21 (×8): 90 mg via SUBCUTANEOUS
  Filled 2023-01-17 (×8): qty 0.9

## 2023-01-17 MED ORDER — BISACODYL 10 MG RE SUPP: 10.0000 mg | Freq: Every day | RECTAL | Status: DC | PRN

## 2023-01-17 MED ORDER — BOOST / RESOURCE BREEZE PO LIQD CUSTOM
1.0000 | Freq: Three times a day (TID) | ORAL | Status: DC
  Administered 2023-01-17 – 2023-01-19 (×4): 1 via ORAL
  Filled 2023-01-17: qty 1

## 2023-01-17 NOTE — Progress Notes (Signed)
Placed pt. Back on HFNC.

## 2023-01-17 NOTE — Progress Notes (Addendum)
NAME:  [name], MRN:  [id number], DOB:  07-15-1940, LOS: 13 ADMISSION DATE:  01/04/2023, CONSULTATION DATE: 01/08/2023 REFERRING MD: Dr. Jomarie Longs, CHIEF COMPLAINT: Right pleural effusion  History of Present Illness:  68 F, history of A-fib, DVT/PE, Takotsubo cardiomyopathy with associated systolic CHF, CVA with residual deficits.  Admitted with weakness, dyspnea, lower extremity edema and encephalopathy on 7/27. Has been treated for volume overload but only moderate response to diuretics.  Right lower lobe infiltrate and severe volume loss consistent with pneumonia/atelectasis with coexisting right pleural effusion.  Underwent right thoracentesis with 1.1 L removed 8/1.  Then bronchoscopy on 8/2 for secretion clearance to hopefully resolve some of her right lower lobe and right middle lobe atelectasis.  She was intubated for that procedure, able to be extubated afterwards on 8/2.  She has required BiPAP on and off  Pertinent  Medical History  CVA. DVT, PE, Takotsubo CM, VF  Significant Hospital Events: Including procedures, antibiotic start and stop dates in addition to other pertinent events   Chest x-ray 01/09/2023-persistent atelectasis right lung Ultrasound chest 01/08/2023-right effusion, consolidation CT scan of the chest 01/09/2023-complete atelectasis right lung, consolidation left base Thoracentesis 8/1, 1.1 L removed, stopped due to chest discomfort Intubation for bronchoscopy 8/2 with right-sided secretions removed.  Extubated postprocedure 8/5 recurrent total right atelectasis, increased respiratory needs >> BiPAP 8/7 off pressors 8/8 Rt pig tail inserted  Interim History / Subjective:  Remains on amiodarone.  Mild irritation at chest tube insertion site.  Breathing more comfortable.  No much chest congestion.  Tolerating diet.  Objective   Blood pressure (!) 87/70, pulse (!) 127, temperature 98.5 F (36.9 C), temperature source Axillary, resp. rate (!) 21, height 5\' 2"  (1.575  m), weight 88.4 kg, SpO2 91%.    Vent Mode: PSV FiO2 (%):  [60 %] 60 % Set Rate:  [24 bmp] 24 bmp PEEP:  [8 cmH20] 8 cmH20 Pressure Support:  [12 cmH20] 12 cmH20   Intake/Output Summary (Last 24 hours) at 01/17/2023 0806 Last data filed at 01/17/2023 0654 Gross per 24 hour  Intake 1429.94 ml  Output 2550 ml  Net -1120.06 ml   Filed Weights   01/15/23 0500 01/16/23 0500 01/17/23 0500  Weight: 85.4 kg 85.5 kg 88.4 kg    Examination:  General - alert Eyes - pupils reactive ENT - no sinus tenderness, no stridor Cardiac - irregular Chest - decreased BS Rt base, no air leak Abdomen - soft, non tender, + bowel sounds Extremities - Lt leg in wrap Skin - no rashes Neuro - normal strength, moves extremities, follows commands  Resolved Hospital Problem list   Septic shock, Hyponatremia  Assessment & Plan:   Acute on chronic hypoxic, hypercapnic respiratory failure. - from RML and RLL CAP with parapneumonic effusion, acute on chronic combined CHF - DNR/DNI - continue high flow oxygen with prn Bipap for goal SpO2 > 90% - continue chest tube to -20 cm suction - f/u CXR - day 4 of unasyn  Hypotension. - continue midorine  Acute on chronic combined CHF. Atrial fibrillation with RVR. 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 HLD. Hx of PE/DVT. - continue amiodarone - cardiology consulted - no additional procedures planned >> will start lovenox  Lt leg cellulitis. - continue ABx - wound care  Hx of gout. - continue allopurinol  Hx of depression. - continue wellbutrin  Peripheral neuropathy. - continue neurontin, tramadol  Constipation. - continue bowel regimen  Best Practice:  Nutrition: Regular diet DVT: Lovenox SUP: Not indicated  Lines: N/A Foley: N/A Code status: DNR/DNI Family discussion: pending  Labs:      Latest Ref Rng & Units 01/17/2023    2:11 AM 01/16/2023   12:54 PM 01/16/2023    2:05 AM  CMP  Glucose 70 - 99 mg/dL 161   94   BUN 8 - 23 mg/dL 10   7    Creatinine 0.96 - 1.00 mg/dL 0.45   4.09   Sodium 811 - 145 mmol/L 135   134   Potassium 3.5 - 5.1 mmol/L 4.6   3.2   Chloride 98 - 111 mmol/L 94   91   CO2 22 - 32 mmol/L 31   32   Calcium 8.9 - 10.3 mg/dL 8.9   8.8   Total Protein 6.5 - 8.1 g/dL 5.3  6.2    Total Bilirubin 0.3 - 1.2 mg/dL 0.7     Alkaline Phos 38 - 126 U/L 117     AST 15 - 41 U/L 46     ALT 0 - 44 U/L 34         Latest Ref Rng & Units 01/17/2023    2:11 AM 01/16/2023    2:05 AM 01/15/2023    6:32 AM  CBC  WBC 4.0 - 10.5 K/uL 8.5  6.5  8.9   Hemoglobin 12.0 - 15.0 g/dL 91.4  78.2  95.6   Hematocrit 36.0 - 46.0 % 37.2  37.3  39.9   Platelets 150 - 400 K/uL 169  177  199    ABG    Component Value Date/Time   PHART 7.345 (L) 01/14/2023 0022   PCO2ART 70.8 (HH) 01/14/2023 0022   PO2ART 63 (L) 01/14/2023 0022   HCO3 38.7 (H) 01/14/2023 0022   TCO2 41 (H) 01/14/2023 0022   ACIDBASEDEF 2.0 08/30/2009 2218   O2SAT 89 01/14/2023 0022    CBG (last 3)  Recent Labs    01/16/23 0353 01/16/23 1936 01/17/23 0334  GLUCAP 106* 114* 93    Signature:  Coralyn Helling, MD [facility] Pager - [phone] 01/17/2023, 8:11 AM

## 2023-01-17 NOTE — Evaluation (Signed)
Occupational Therapy Evaluation Patient Details Name: [name] MRN: [id number] DOB: 1940-07-08 Today's Date: 01/17/2023   History of Present Illness 82 y.o. female admitted 7/27 with weakness, SOB, AMS. Pt with Rt pleural effusion, s/p thoracentesis 8/1. 8/2 intubated, bronch and extubated. 8/4 Afib with RVR. 8/8 thoracentesis and chest tube placement. PMH: Takotsubo cardiomyopathy, CVA, DVT/PE, Vfib, chronic hypoxemic respiratory failure on 2L PRN , L LE non-healing wound/cellulitis   Clinical Impression   PTA, pt lived with son and was independent. Upon eval, pt presents with oxygen dependence, decreased activity tolerance, decreased knowledge of AD/AE, safety, strength, and balance. Pt performing UB ADL with set-up and LB ADL with min-mod A. Pt motivated to have BM on BSC but limited success during session. Oriented, pleasant, and conversational. Desatting as low as 88% SpO2 during transfers. Will continue to follow. Patient will benefit from continued inpatient follow up therapy, <3 hours/day       If plan is discharge home, recommend the following: A lot of help with walking and/or transfers;A lot of help with bathing/dressing/bathroom;Assistance with cooking/housework;Help with stairs or ramp for entrance;Assist for transportation    Functional Status Assessment  Patient has had a recent decline in their functional status and demonstrates the ability to make significant improvements in function in a reasonable and predictable amount of time.  Equipment Recommendations  Other (comment) (defer)    Recommendations for Other Services       Precautions / Restrictions Precautions Precautions: Fall Precaution Comments: watch SpO2, HHFNC, R  posterolateral chest tube Restrictions Weight Bearing Restrictions: No      Mobility Bed Mobility               General bed mobility comments: up in recliner on arrival and departure    Transfers Overall transfer level: Needs  assistance Equipment used: Rolling walker (2 wheels) Transfers: Sit to/from Stand Sit to Stand: Contact guard assist           General transfer comment: initially needing cues for hand placement      Balance Overall balance assessment: Needs assistance Sitting-balance support: No upper extremity supported, Feet supported Sitting balance-Leahy Scale: Fair     Standing balance support: Bilateral upper extremity supported, During functional activity, Reliant on assistive device for balance Standing balance-Leahy Scale: Poor Standing balance comment: dependent on RW                           ADL either performed or assessed with clinical judgement   ADL Overall ADL's : Needs assistance/impaired Eating/Feeding: Set up;Sitting   Grooming: Set up;Sitting   Upper Body Bathing: Set up;Sitting   Lower Body Bathing: Minimal assistance;Sit to/from stand   Upper Body Dressing : Set up;Sitting   Lower Body Dressing: Minimal assistance;Sit to/from stand   Toilet Transfer: Contact guard assist;Ambulation;Rolling walker (2 wheels) Toilet Transfer Details (indicate cue type and reason): ~5 ft Toileting- Clothing Manipulation and Hygiene: Total assistance;Sit to/from stand       Functional mobility during ADLs: Contact guard assist;Rolling walker (2 wheels)       Vision Ability to See in Adequate Light: 0 Adequate Patient Visual Report: No change from baseline Vision Assessment?: No apparent visual deficits     Perception Perception: Within Functional Limits       Praxis Praxis: WFL       Pertinent Vitals/Pain Pain Assessment Pain Assessment: Faces Faces Pain Scale: Hurts little more Pain Location: Rt chest tube Pain Descriptors /  Indicators: Aching, Sore Pain Intervention(s): Limited activity within patient's tolerance, Monitored during session     Extremity/Trunk Assessment Upper Extremity Assessment Upper Extremity Assessment: Generalized weakness    Lower Extremity Assessment Lower Extremity Assessment: Defer to PT evaluation   Cervical / Trunk Assessment Cervical / Trunk Assessment: Normal   Communication Communication Communication: Hearing impairment (hard of hearing) Cueing Techniques: Verbal cues   Cognition Arousal: Alert Behavior During Therapy: WFL for tasks assessed/performed Overall Cognitive Status: Within Functional Limits for tasks assessed                                 General Comments: Pt hard of hearing, so intermittently needing repeated commands, but overall pleasant, conversational, oriented.     General Comments  HFNC 60% FiO2 at 45 L    Exercises     Shoulder Instructions      Home Living Family/patient expects to be discharged to:: Private residence Living Arrangements: Children (son) Available Help at Discharge: Family;Available 24 hours/day Type of Home: House Home Access: Stairs to enter [employer] of Steps: 4 Entrance Stairs-Rails: Can reach both Home Layout: One level     Bathroom Shower/Tub:  (walk in tub)   Bathroom Toilet: Handicapped height Bathroom Accessibility: Yes   Home Equipment: Agricultural consultant (2 wheels);Cane - single point;Shower seat;Grab bars - tub/shower;Hand held shower head          Prior Functioning/Environment Prior Level of Function : Independent/Modified Independent             Mobility Comments: used cane occassionally ADLs Comments: was indep and cooking        OT Problem List: Decreased strength;Impaired balance (sitting and/or standing);Decreased activity tolerance;Cardiopulmonary status limiting activity;Decreased knowledge of use of DME or AE      OT Treatment/Interventions: Self-care/ADL training;Therapeutic exercise;DME and/or AE instruction;Balance training;Patient/family education;Therapeutic activities    OT Goals(Current goals can be found in the care plan section) Acute Rehab OT Goals Patient Stated Goal:  get better OT Goal Formulation: With patient Time For Goal Achievement: 01/31/23 Potential to Achieve Goals: Good  OT Frequency: Min 1X/week    Co-evaluation              AM-PAC OT "6 Clicks" Daily Activity     Outcome Measure Help from another person eating meals?: A Little Help from another person taking care of personal grooming?: A Little Help from another person toileting, which includes using toliet, bedpan, or urinal?: A Lot Help from another person bathing (including washing, rinsing, drying)?: A Lot Help from another person to put on and taking off regular upper body clothing?: A Little Help from another person to put on and taking off regular lower body clothing?: A Lot 6 Click Score: 15   End of Session Equipment Utilized During Treatment: Gait belt;Rolling walker (2 wheels) Nurse Communication: Mobility status  Activity Tolerance: Patient tolerated treatment well Patient left: in chair;with call bell/phone within reach;with chair alarm set;with family/visitor present  OT Visit Diagnosis: Unsteadiness on feet (R26.81);Muscle weakness (generalized) (M62.81)                Time: 1205-1300 OT Time Calculation (min): 55 min Charges:  OT General Charges $OT Visit: 1 Visit OT Evaluation $OT Eval Moderate Complexity: 1 Mod OT Treatments $Self Care/Home Management : 38-52 mins  Tyler Deis, OTR/L [facility] Acute Rehabilitation Office: [phone]   Myrla Halsted 01/17/2023, 2:54 PM

## 2023-01-17 NOTE — Progress Notes (Signed)
Progress Note  Patient Name: [name] Date of Encounter: 01/17/2023 Primary Cardiologist: Sherilyn Cooter [facility] ([name])  Subjective   S/p chest tube.  S/p Thoracentesis. BP low despite midodrine. Feels like she can't get a deep breath. She is able to eat and take PO again.  Vital Signs    Vitals:   01/17/23 0336 01/17/23 0400 01/17/23 0500 01/17/23 0614  BP:  104/72 (!) 87/70   Pulse:  (!) 118 (!) 124   Resp:  (!) 21 (!) 26   Temp: 98.5 F (36.9 C)     TempSrc: Axillary     SpO2:  96% 99% 92%  Weight:   88.4 kg   Height:        Intake/Output Summary (Last 24 hours) at 01/17/2023 0756 Last data filed at 01/17/2023 0654 Gross per 24 hour  Intake 1505.32 ml  Output 2550 ml  Net -1044.68 ml   Filed Weights   01/15/23 0500 01/16/23 0500 01/17/23 0500  Weight: 85.4 kg 85.5 kg 88.4 kg    Physical Exam   GEN: acute distress.  Ill appearing Neck:  JVD + Cardiac: IRIR tachycardia,systolic murmur Respiratory: No tachypnea on HFNC, improved breath sounds GI: Soft, nontender, non-distended  MS: Non-pitting edema  Labs   Telemetry: AF RVR with rare AF pause < 5 seconds   Chemistry Recent Labs  Lab 01/10/23 1007 01/10/23 1310 01/15/23 0632 01/16/23 0205 01/16/23 1254 01/17/23 0211  NA 137   < > 135 134*  --  135  K 3.7   < > 5.1 3.2*  --  4.6  CL 84*   < > 92* 91*  --  94*  CO2 41*   < > 27 32  --  31  GLUCOSE 130*   < > 117* 94  --  109*  BUN 14   < > 7* 7*  --  10  CREATININE 0.83   < > 0.63 0.65  --  0.96  CALCIUM 9.3   < > 8.8* 8.8*  --  8.9  PROT 6.6  --   --   --  6.2* 5.3*  ALBUMIN 2.8*  --   --   --  2.4* 2.1*  AST 41  --   --   --   --  46*  ALT 27  --   --   --   --  34  ALKPHOS 136*  --   --   --   --  117  BILITOT 0.9  --   --   --   --  0.7  GFRNONAA >60   < > >60 >60  --  59*  ANIONGAP 12   < > 16* 11  --  10   < > = values in this interval not displayed.     Hematology Recent Labs  Lab 01/15/23 0632 01/16/23 0205  01/17/23 0211  WBC 8.9 6.5 8.5  RBC 4.35 4.00 3.99  HGB 12.6 11.4* 11.9*  HCT 39.9 37.3 37.2  MCV 91.7 93.3 93.2  MCH 29.0 28.5 29.8  MCHC 31.6 30.6 32.0  RDW 14.8 14.8 14.9  PLT 199 177 169    Cardiac Studies   Cardiac Studies & Procedures       ECHOCARDIOGRAM  ECHOCARDIOGRAM COMPLETE 01/05/2023  Narrative ECHOCARDIOGRAM REPORT    Patient Name:   [name] Date of Exam: 01/05/2023 Medical Rec #:  [id number]      Height:       62.0  in Accession #:    [id number]     Weight:       194.7 lb Date of Birth:  Jun 09, 1941      BSA:          1.890 m Patient Age:    82 years       BP:           102/63 mmHg Patient Gender: F              HR:           102 bpm. Exam Location:  Inpatient  Procedure: 2D Echo, Cardiac Doppler and Color Doppler  Indications:    acute systolic chf  History:        Patient has prior history of Echocardiogram examinations, most recent 07/26/2013. History of Takotsubo, Arrythmias:Atrial Fibrillation; Signs/Symptoms:Shortness of Breath.  Sonographer:    Delcie Roch RDCS Referring Phys: [id number] CHING T TU  IMPRESSIONS   1. Left ventricular ejection fraction, by estimation, is 55% with beat to beat variability. The left ventricle has normal function. The left ventricle has no regional wall motion abnormalities. There is mild left ventricular hypertrophy. Left ventricular diastolic parameters are indeterminate. 2. Right ventricular systolic function is mildly reduced. The right ventricular size is moderately enlarged. There is moderately elevated pulmonary artery systolic pressure. The estimated right ventricular systolic pressure is 50.5 mmHg. 3. Left atrial size was severely dilated. 4. Right atrial size was severely dilated. 5. The mitral valve is degenerative. Severe mitral valve regurgitation. No evidence of mitral stenosis. Severe mitral annular calcification. 6. 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 7. The aortic valve is tricuspid.  There is mild calcification of the aortic valve. Aortic valve regurgitation is mild. No aortic stenosis is present. 8. The inferior vena cava is dilated in size with <50% respiratory variability, suggesting right atrial pressure of 15 mmHg.  FINDINGS Left Ventricle: Left ventricular ejection fraction, by estimation, is 55%. The left ventricle has normal function. The left ventricle has no regional wall motion abnormalities. The left ventricular internal cavity size was normal in size. There is mild left ventricular hypertrophy. Left ventricular diastolic parameters are indeterminate.  Right Ventricle: The right ventricular size is moderately enlarged. No increase in right ventricular wall thickness. Right ventricular systolic function is mildly reduced. There is moderately elevated pulmonary artery systolic pressure. The tricuspid regurgitant velocity is 2.98 m/s, and with an assumed right atrial pressure of 15 mmHg, the estimated right ventricular systolic pressure is 50.5 mmHg.  Left Atrium: Left atrial size was severely dilated.  Right Atrium: Right atrial size was severely dilated.  Pericardium: There is no evidence of pericardial effusion.  Mitral Valve: The mitral valve is degenerative in appearance. Severe mitral annular calcification. Severe mitral valve regurgitation. No evidence of mitral valve stenosis. The mean mitral valve gradient is 3.0 mmHg with average heart rate of 101 bpm.  Tricuspid Valve: The tricuspid valve is normal in structure. 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 No evidence of tricuspid stenosis.  Aortic Valve: The aortic valve is tricuspid. There is mild calcification of the aortic valve. Aortic valve regurgitation is mild. Aortic regurgitation PHT measures 451 msec. No aortic stenosis is present.  Pulmonic Valve: The pulmonic valve was normal in structure. Pulmonic valve regurgitation is trivial. No evidence of pulmonic stenosis.  Aorta: The aortic root is  normal in size and structure.  Venous: The inferior vena cava is dilated in size with less than 50% respiratory variability, suggesting right atrial pressure of 15 mmHg.  The inferior vena cava and the hepatic vein show a pattern of systolic flow reversal, suggestive of tricuspid regurgitation.  IAS/Shunts: No atrial level shunt detected by color flow Doppler.  Additional Comments: There is pleural effusion in the right lateral region.   LEFT VENTRICLE PLAX 2D LVIDd:         4.80 cm LVIDs:         3.80 cm LV PW:         1.10 cm LV IVS:        1.20 cm LVOT diam:     2.10 cm LVOT Area:     3.46 cm   RIGHT VENTRICLE             IVC RV Basal diam:  3.60 cm     IVC diam: 2.60 cm RV S prime:     10.90 cm/s TAPSE (M-mode): 1.1 cm  LEFT ATRIUM              Index        RIGHT ATRIUM           Index LA diam:        5.10 cm  2.70 cm/m   RA Area:     29.70 cm LA Vol (A2C):   126.0 ml 66.67 ml/m  RA Volume:   104.00 ml 55.03 ml/m LA Vol (A4C):   94.0 ml  49.74 ml/m LA Biplane Vol: 109.0 ml 57.68 ml/m AORTIC VALVE AI PHT:      451 msec  AORTA Ao Root diam: 2.80 cm Ao Asc diam:  3.60 cm  MITRAL VALVE           TRICUSPID VALVE MV Mean grad: 3.0 mmHg TR Peak grad:   35.5 mmHg TR Vmax:        298.00 cm/s  SHUNTS Systemic Diam: 2.10 cm  Weston Brass MD Electronically signed by Weston Brass MD Signature Date/Time: 01/05/2023/8:37:43 PM    Final                  Assessment & Plan   Acute on chronic respiratory failure with hypoxia and hypercapnia Acute on chronic HFpEF with moderate right pleural effusion - multifocal with CAP, volume, and aspiration PNA - low BP, now s/p thoracentesis and chest tube - BNP for tomorrow, if BP stabilizes will give 80 IV lasix X1; last attempt at diuretic did have associated symptomatic hypotension   Persistent atrial fibrillation Hypotension requiring midodrine and/or pressors - Continue IV amio - no improvement with digoxin  earlier in the course (neither BP or AF) - holding metoprolol, if BP room would favor diuresis support over re-introduction of AF nodal agents   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      Riley Lam, MD FASE [facility] Cardiologist [facility] [place]  [address] [place], [address] [place], [place] [address] [phone]  7:56 AM

## 2023-01-17 NOTE — Progress Notes (Signed)
Nutrition Follow-up  DOCUMENTATION CODES:   Not applicable  INTERVENTION:  Liberalize pt diet to regular due to increase needs  Boost Breeze po TID, each supplement provides 250 kcal and 9 grams of protein Multivitamin w/ minerals daily  NUTRITION DIAGNOSIS:  Increased nutrient needs related to wound healing as evidenced by estimated needs. - Ongoing  GOAL:  Patient will meet greater than or equal to 90% of their needs - Ongoing  MONITOR:  PO intake, Supplement acceptance, I & O's, Labs  REASON FOR ASSESSMENT:   (Pressure Injury)    ASSESSMENT:  Pt with hx of CHF, previous stroke, PAF, and DVT presented to ED with SOB worsening over the last week. Found to have CHF exacerbation. Of note, pt with wounds and edema to the BLE that are cared for by home health.  8/01 - right thoracentesis, 1.1L of bloody fluid drained 8/02 - intubated, bronchoscopy, extubated 8/05 - respiratory decline, pt made NPO and requiring BiPAP 8/08 - diet advanced to heart healthy; Thoracentesis (500 mL removed); R chest tube placed  Spoke with RN, reports pt eating fairly well. Still requiring BiPAP at night and did need it for ~1 hour this morning.   Pt sitting in chair, family at bedside. Pt lunch tray in front of her, reports that she may eat ~50% of it. Pt reports she does not like Ensure because it taste like medicine. Was agreeable to try Boost Breeze.   Medications reviewed and include: Vitamin D3, Calcium Carbonate, Vitamin B12, Colace, MVI, Miralax, Senokot-S, IV antibiotics  Labs reviewed: Sodium 135, Potassium 4.6, BUN 10, Creatinine 0.96, Magnesium 2.1 CBG: 93-114 x 24 hrs   UOP: 600 ml x 24 hrs Chest tube output: 1950 ml x 24 hrs  Diet Order:   Diet Order             Diet regular Room service appropriate? Yes with Assist; Fluid consistency: Thin  Diet effective now                   EDUCATION NEEDS:  Education needs have been addressed  Skin:  Skin Assessment: Reviewed RN  Assessment Skin tear:  - left buttocks Stage 2: - left tibial area   Last BM:  8/4  Height:  Ht Readings from Last 1 Encounters:  01/04/23 5\' 2"  (1.575 m)    Weight:  Wt Readings from Last 1 Encounters:  01/17/23 88.4 kg    Ideal Body Weight:  50 kg  BMI:  Body mass index is 35.65 kg/m.  Estimated Nutritional Needs:  Kcal:  1500-1700 kcal/d Protein:  75-90g/d Fluid:  1.5-1.7L/d   Kirby Crigler RD, LDN Clinical Dietitian See Loretha Stapler for contact information.

## 2023-01-17 NOTE — Progress Notes (Signed)
ANTICOAGULATION CONSULT NOTE - Initial Consult  Pharmacy Consult for enoxaparin, treatment dose Indication: atrial fibrillation  Allergies  Allergen Reactions   Codeine    Morphine And Codeine Other (See Comments)    confusion    Patient Measurements: Height: 5\' 2"  (157.5 cm) Weight: 88.4 kg (194 lb 14.2 oz) IBW/kg (Calculated) : 50.1 Dosing Weight: ~88 kg  Vital Signs: Temp: 97.3 F (36.3 C) (08/09 0815) Temp Source: Axillary (08/09 0815) BP: 130/74 (08/09 0800) Pulse Rate: 128 (08/09 0815)  Labs: Recent Labs    01/15/23 0632 01/16/23 0205 01/17/23 0211  HGB 12.6 11.4* 11.9*  HCT 39.9 37.3 37.2  PLT 199 177 169  CREATININE 0.63 0.65 0.96    Estimated Creatinine Clearance: 46.6 mL/min (by C-G formula based on SCr of 0.96 mg/dL).   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 Patient was unable to take PO medications so subcutaneous heparin was initiated as DVT prophylaxis. Re-initiating treatment dose anticoagulation with improvement in clinical status. Last dose of heparin was administered 8/9 0643  Goal of Therapy:  Monitor platelets by anticoagulation protocol: Yes   Plan:  Lovenox 90 mg [place] every 12 hours Initiating first dose at 1700 to avoid scheduling subcutaneous injections that would disrupt patient's sleep.  Rutherford Nail, PharmD PGY2 Critical Care Pharmacy Resident 01/17/2023,8:50 AM

## 2023-01-17 NOTE — Progress Notes (Signed)
Physical Therapy Treatment Patient Details Name: [name] MRN: [id number] DOB: 23-Oct-1940 Today's Date: 01/17/2023   History of Present Illness 82 y.o. female admitted 7/27 with weakness, SOB, AMS. Pt with Rt pleural effusion, s/p thoracentesis 8/1. 8/2 intubated, bronch and extubated. 8/4 Afib with RVR. 8/8 thoracentesis and chest tube placement. PMH: Takotsubo cardiomyopathy, CVA, DVT/PE, Vfib, chronic hypoxemic respiratory failure on 2L PRN , L LE non-healing wound/cellulitis    PT Comments  Pt pleasant and reports soreness, pain at chest tube site. Pt able to pivot to [facility] and take steps at chair but limited by fatigue with SPO2 88-95% on HHFNC 60% at 45L with HR 115-140 with activity. Pt encouraged to be OOb daily as able to tolerate and continue HEp, will continue to follow.      If plan is discharge home, recommend the following: A little help with walking and/or transfers;A little help with bathing/dressing/bathroom;Assist for transportation;Help with stairs or ramp for entrance;A lot of help with bathing/dressing/bathroom;Assistance with cooking/housework   Can travel by private vehicle     Yes  Equipment Recommendations  BSC/3in1    Recommendations for Other Services       Precautions / Restrictions Precautions Precautions: Fall Precaution Comments: watch SpO2, HHFNC, chest tube     Mobility  Bed Mobility Overal bed mobility: Needs Assistance Bed Mobility: Supine to Sit     Supine to sit: HOB elevated, Min assist     General bed mobility comments: HOB 35 degrees with increased time, rail and min assist to fully elevate trunk and scoot to EOB    Transfers Overall transfer level: Needs assistance   Transfers: Sit to/from Stand Sit to Stand: Contact guard assist           General transfer comment: cues for hand placement from bed, BSC and recliner x 3 trials    Ambulation/Gait Ambulation/Gait assistance: Min assist, +2 safety/equipment Gait Distance  (Feet): 6 Feet Assistive device: Rolling walker (2 wheels) Gait Pattern/deviations: Step-through pattern, Trunk flexed, Wide base of support       General Gait Details: cues for posture, proximity to RW and safety. Pt able to step foward and back a few feet at the chair x 2 trials, limited by fatigue adn [facility]   Stairs             Wheelchair Mobility     Tilt Bed    Modified Rankin (Stroke Patients Only)       Balance Overall balance assessment: Needs assistance Sitting-balance support: No upper extremity supported, Feet supported Sitting balance-Leahy Scale: Fair     Standing balance support: Bilateral upper extremity supported, During functional activity, Reliant on assistive device for balance Standing balance-Leahy Scale: Poor Standing balance comment: dependent on RW                            Cognition Arousal: Alert Behavior During Therapy: WFL for tasks assessed/performed Overall Cognitive Status: Within Functional Limits for tasks assessed                                          Exercises General Exercises - Lower Extremity Long Arc Quad: AROM, Both, Seated, 10 reps Hip Flexion/Marching: AROM, Both, 5 reps, Seated    General Comments        Pertinent Vitals/Pain Pain Assessment Pain Assessment: 0-10 Pain Score:  4  Pain Location: Rt chest tube Pain Descriptors / Indicators: Aching, Sore Pain Intervention(s): Limited activity within patient's tolerance, Monitored during session, Repositioned    Home Living                          Prior Function            PT Goals (current goals can now be found in the care plan section) Progress towards PT goals: Progressing toward goals    Frequency    Min 1X/week      PT Plan Current plan remains appropriate    Co-evaluation              AM-PAC PT "6 Clicks" Mobility   Outcome Measure  Help needed turning from your back to your side while in  a flat bed without using bedrails?: A Little Help needed moving from lying on your back to sitting on the side of a flat bed without using bedrails?: A Little Help needed moving to and from a bed to a chair (including a wheelchair)?: A Little Help needed standing up from a chair using your arms (e.g., wheelchair or bedside chair)?: A Lot Help needed to walk in hospital room?: A Lot Help needed climbing 3-5 steps with a railing? : Total 6 Click Score: 14    End of Session Equipment Utilized During Treatment: Oxygen;Gait belt Activity Tolerance: Patient tolerated treatment well Patient left: in chair;with call bell/phone within reach;with chair alarm set Nurse Communication: Mobility status PT Visit Diagnosis: Muscle weakness (generalized) (M62.81);Difficulty in walking, not elsewhere classified (R26.2);Other abnormalities of gait and mobility (R26.89)     Time: 7425-9563 PT Time Calculation (min) (ACUTE ONLY): 31 min  Charges:    $Gait Training: 8-22 mins $Therapeutic Activity: 8-22 mins PT General Charges $$ ACUTE PT VISIT: 1 Visit                     Merryl Hacker, PT Acute Rehabilitation Services Office: [phone]    Marilyn Wing B Aron Inge 01/17/2023, 11:05 AM

## 2023-01-18 ENCOUNTER — Inpatient Hospital Stay (HOSPITAL_COMMUNITY): Payer: Medicare Other

## 2023-01-18 DIAGNOSIS — J9622 Acute and chronic respiratory failure with hypercapnia: Secondary | ICD-10-CM | POA: Diagnosis not present

## 2023-01-18 DIAGNOSIS — J9621 Acute and chronic respiratory failure with hypoxia: Secondary | ICD-10-CM | POA: Diagnosis not present

## 2023-01-18 MED ORDER — SENNOSIDES-DOCUSATE SODIUM 8.6-50 MG PO TABS
2.0000 | ORAL_TABLET | Freq: Two times a day (BID) | ORAL | Status: DC
  Administered 2023-01-18 – 2023-01-29 (×15): 2 via ORAL
  Filled 2023-01-18 (×18): qty 2

## 2023-01-18 MED ORDER — FUROSEMIDE 10 MG/ML IJ SOLN
40.0000 mg | Freq: Once | INTRAMUSCULAR | Status: AC
  Administered 2023-01-18: 40 mg via INTRAVENOUS
  Filled 2023-01-18: qty 4

## 2023-01-18 MED ORDER — AMOXICILLIN-POT CLAVULANATE 875-125 MG PO TABS
1.0000 | ORAL_TABLET | Freq: Two times a day (BID) | ORAL | Status: AC
  Administered 2023-01-18 – 2023-01-19 (×4): 1 via ORAL
  Filled 2023-01-18 (×4): qty 1

## 2023-01-18 NOTE — Progress Notes (Signed)
Pt PIV placed by IV team using ultrasound after pateints 2 PIVs infiltrated. IV team states that pateiny will need a PICC or central venous line if IV pladced today infiltrates.   Old PIVs infiltrated amiodarone the other unasyn and NS. Critical Care MD/resident changed IV ABX to PO. Cardiology managing Amiodarone and wishes to continue IV. No PO transition today. Will continue to monitor.

## 2023-01-18 NOTE — Progress Notes (Signed)
NAME:  [name], MRN:  [id number], DOB:  07-May-1941, LOS: 14 ADMISSION DATE:  01/04/2023, CONSULTATION DATE: 01/08/2023 REFERRING MD: Dr. Jomarie Longs, CHIEF COMPLAINT: Right pleural effusion  History of Present Illness:  20 F, history of A-fib, DVT/PE, Takotsubo cardiomyopathy with associated systolic CHF, CVA with residual deficits.  Admitted with weakness, dyspnea, lower extremity edema and encephalopathy on 7/27. Has been treated for volume overload but only moderate response to diuretics.  Right lower lobe infiltrate and severe volume loss consistent with pneumonia/atelectasis with coexisting right pleural effusion.  Underwent right thoracentesis with 1.1 L removed 8/1.  Then bronchoscopy on 8/2 for secretion clearance to hopefully resolve some of her right lower lobe and right middle lobe atelectasis.  She was intubated for that procedure, able to be extubated afterwards on 8/2.  She has required BiPAP on and off  Pertinent  Medical History  CVA. DVT, PE, Takotsubo CM, VF  Significant Hospital Events: Including procedures, antibiotic start and stop dates in addition to other pertinent events   Chest x-ray 01/09/2023-persistent atelectasis right lung Ultrasound chest 01/08/2023-right effusion, consolidation CT scan of the chest 01/09/2023-complete atelectasis right lung, consolidation left base Thoracentesis 8/1, 1.1 L removed, stopped due to chest discomfort Intubation for bronchoscopy 8/2 with right-sided secretions removed.  Extubated postprocedure 8/5 recurrent total right atelectasis, increased respiratory needs >> BiPAP 8/7 off pressors 8/8 Rt pig tail inserted  Interim History / Subjective:  Slept okay overnight.  Not having chest congestion.  Not much pain at chest tube site.  Wore Bipap overnight.  Objective   Blood pressure 94/70, pulse (!) 118, temperature 97.7 F (36.5 C), temperature source Oral, resp. rate (!) 24, height 5\' 2"  (1.575 m), weight 86.4 kg, SpO2 92%.    FiO2  (%):  [50 %-66 %] 55 % PEEP:  [8 cmH20] 8 cmH20 Pressure Support:  [12 cmH20-20 cmH20] 20 cmH20   Intake/Output Summary (Last 24 hours) at 01/18/2023 0813 Last data filed at 01/18/2023 0400 Gross per 24 hour  Intake 1345.83 ml  Output 850 ml  Net 495.83 ml   Filed Weights   01/16/23 0500 01/17/23 0500 01/18/23 0500  Weight: 85.5 kg 88.4 kg 86.4 kg    Examination:  General - alert Eyes - pupils reactive ENT - no sinus tenderness, no stridor Cardiac - irregular, tachycardic Chest - decreased BS on Rt, no wheeze, Rt chest tube w/o air leak Abdomen - soft, non tender, + bowel sounds Extremities - no cyanosis, clubbing, or edema Skin - Lt lower leg in a wrap Neuro - follows commands  Resolved Hospital Problem list   Septic shock, Hyponatremia  Assessment & Plan:   Acute on chronic hypoxic, hypercapnic respiratory failure. - from RML and RLL CAP with parapneumonic effusion, acute on chronic combined CHF - DNR/DNI - continue high flow oxygen with prn Bipap for goal SpO2 > 90% - continue chest tube to -20 cm suction - f/u CXR 8/11 - day 5 of unasyn  Hypotension. - continue midorine  Acute on chronic combined CHF. Atrial fibrillation with RVR. 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 HLD. Hx of PE/DVT. - continue amiodarone >> hopefully can transition to oral soon - cardiology consulted - no additional procedures planned >> continue full dose lovenox for now; if stable then likely can transition to eliquis later next week  Lt leg cellulitis. - continue ABx - wound care  Hx of gout. - continue allopurinol  Hx of depression. - continue wellbutrin  Peripheral neuropathy. - continue neurontin, tramadol  Constipation. - continue bowel regimen  Best Practice:  Nutrition: Regular diet DVT: Lovenox SUP: Not indicated Lines: N/A Foley: N/A Code status: DNR/DNI Family discussion: pending  Labs:      Latest Ref Rng & Units 01/18/2023    3:07 AM 01/17/2023    2:11  AM 01/16/2023   12:54 PM  CMP  Glucose 70 - 99 mg/dL 308  657    BUN 8 - 23 mg/dL 10  10    Creatinine 8.46 - 1.00 mg/dL 9.62  9.52    Sodium 841 - 145 mmol/L 134  135    Potassium 3.5 - 5.1 mmol/L 4.4  4.6    Chloride 98 - 111 mmol/L 88  94    CO2 22 - 32 mmol/L 34  31    Calcium 8.9 - 10.3 mg/dL 9.6  8.9    Total Protein 6.5 - 8.1 g/dL  5.3  6.2   Total Bilirubin 0.3 - 1.2 mg/dL  0.7    Alkaline Phos 38 - 126 U/L  117    AST 15 - 41 U/L  46    ALT 0 - 44 U/L  34        Latest Ref Rng & Units 01/18/2023    3:07 AM 01/17/2023    2:11 AM 01/16/2023    2:05 AM  CBC  WBC 4.0 - 10.5 K/uL 9.6  8.5  6.5   Hemoglobin 12.0 - 15.0 g/dL 32.4  40.1  02.7   Hematocrit 36.0 - 46.0 % 43.9  37.2  37.3   Platelets 150 - 400 K/uL 193  169  177    ABG    Component Value Date/Time   PHART 7.345 (L) 01/14/2023 0022   PCO2ART 70.8 (HH) 01/14/2023 0022   PO2ART 63 (L) 01/14/2023 0022   HCO3 38.7 (H) 01/14/2023 0022   TCO2 41 (H) 01/14/2023 0022   ACIDBASEDEF 2.0 08/30/2009 2218   O2SAT 89 01/14/2023 0022    CBG (last 3)  Recent Labs    01/16/23 0353 01/16/23 1936 01/17/23 0334  GLUCAP 106* 114* 93    Signature:  Coralyn Helling, MD [place] Pulmonary/Critical Care Pager - [phone] or [phone] 01/18/2023, 8:13 AM

## 2023-01-18 NOTE — Progress Notes (Signed)
   01/18/23 0745  BiPAP/CPAP/SIPAP  Reason BIPAP/CPAP not in use Other(comment) (on standby., pt taken off and placed on HHFNC)

## 2023-01-18 NOTE — Progress Notes (Signed)
Progress Note  Patient Name: [name] Date of Encounter: 01/18/2023 Primary Cardiologist: Sherilyn Cooter [facility] (Ms. [name])  Subjective   Net positive. Feels the same.  Still restricted to high flow nasal cannula  Vital Signs    Vitals:   01/18/23 0322 01/18/23 0400 01/18/23 0500 01/18/23 0502  BP:  (!) 98/51 (!) 84/68 (!) 132/100  Pulse: (!) 121 (!) 121 (!) 112 (!) 119  Resp: (!) 26 (!) 24 (!) 24 (!) 24  Temp: (!) 97.5 F (36.4 C)     TempSrc: Axillary     SpO2:  96% 94% 96%  Weight:   86.4 kg   Height:        Intake/Output Summary (Last 24 hours) at 01/18/2023 1610 Last data filed at 01/18/2023 0400 Gross per 24 hour  Intake 1345.83 ml  Output 850 ml  Net 495.83 ml   Filed Weights   01/16/23 0500 01/17/23 0500 01/18/23 0500  Weight: 85.5 kg 88.4 kg 86.4 kg    Physical Exam   GEN: acute distress.  Ill appearing Neck:  JVD + Cardiac: IRIR tachycardia,systolic murmur  Respiratory: tachypnea on HFNC; bilateral rhonchi GI: Soft, nontender, non-distended  MS: Non-pitting edema  Labs   Telemetry: AF RVR   Chemistry Recent Labs  Lab 01/16/23 0205 01/16/23 1254 01/17/23 0211 01/18/23 0307  NA 134*  --  135 134*  K 3.2*  --  4.6 4.4  CL 91*  --  94* 88*  CO2 32  --  31 34*  GLUCOSE 94  --  109* 104*  BUN 7*  --  10 10  CREATININE 0.65  --  0.96 0.69  CALCIUM 8.8*  --  8.9 9.6  PROT  --  6.2* 5.3*  --   ALBUMIN  --  2.4* 2.1*  --   AST  --   --  46*  --   ALT  --   --  34  --   ALKPHOS  --   --  117  --   BILITOT  --   --  0.7  --   GFRNONAA >60  --  59* >60  ANIONGAP 11  --  10 12     Hematology Recent Labs  Lab 01/16/23 0205 01/17/23 0211 01/18/23 0307  WBC 6.5 8.5 9.6  RBC 4.00 3.99 4.62  HGB 11.4* 11.9* 13.6  HCT 37.3 37.2 43.9  MCV 93.3 93.2 95.0  MCH 28.5 29.8 29.4  MCHC 30.6 32.0 31.0  RDW 14.8 14.9 15.0  PLT 177 169 193    Cardiac Studies   Cardiac Studies & Procedures       ECHOCARDIOGRAM  ECHOCARDIOGRAM  COMPLETE 01/05/2023  Narrative ECHOCARDIOGRAM REPORT    Patient Name:   [name] Date of Exam: 01/05/2023 Medical Rec #:  [id number]      Height:       62.0 in Accession #:    [id number]     Weight:       194.7 lb Date of Birth:  Sep 25, 1940      BSA:          1.890 m Patient Age:    82 years       BP:           102/63 mmHg Patient Gender: F              HR:           102 bpm. Exam Location:  Inpatient  Procedure: 2D  Echo, Cardiac Doppler and Color Doppler  Indications:    acute systolic chf  History:        Patient has prior history of Echocardiogram examinations, most recent 07/26/2013. History of Takotsubo, Arrythmias:Atrial Fibrillation; Signs/Symptoms:Shortness of Breath.  Sonographer:    Delcie Roch RDCS Referring Phys: [id number] CHING T TU  IMPRESSIONS   1. Left ventricular ejection fraction, by estimation, is 55% with beat to beat variability. The left ventricle has normal function. The left ventricle has no regional wall motion abnormalities. There is mild left ventricular hypertrophy. Left ventricular diastolic parameters are indeterminate. 2. Right ventricular systolic function is mildly reduced. The right ventricular size is moderately enlarged. There is moderately elevated pulmonary artery systolic pressure. The estimated right ventricular systolic pressure is 50.5 mmHg. 3. Left atrial size was severely dilated. 4. Right atrial size was severely dilated. 5. The mitral valve is degenerative. Severe mitral valve regurgitation. No evidence of mitral stenosis. Severe mitral annular calcification. 6. 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 7. The aortic valve is tricuspid. There is mild calcification of the aortic valve. Aortic valve regurgitation is mild. No aortic stenosis is present. 8. The inferior vena cava is dilated in size with <50% respiratory variability, suggesting right atrial pressure of 15 mmHg.  FINDINGS Left Ventricle: Left ventricular ejection  fraction, by estimation, is 55%. The left ventricle has normal function. The left ventricle has no regional wall motion abnormalities. The left ventricular internal cavity size was normal in size. There is mild left ventricular hypertrophy. Left ventricular diastolic parameters are indeterminate.  Right Ventricle: The right ventricular size is moderately enlarged. No increase in right ventricular wall thickness. Right ventricular systolic function is mildly reduced. There is moderately elevated pulmonary artery systolic pressure. The tricuspid regurgitant velocity is 2.98 m/s, and with an assumed right atrial pressure of 15 mmHg, the estimated right ventricular systolic pressure is 50.5 mmHg.  Left Atrium: Left atrial size was severely dilated.  Right Atrium: Right atrial size was severely dilated.  Pericardium: There is no evidence of pericardial effusion.  Mitral Valve: The mitral valve is degenerative in appearance. Severe mitral annular calcification. Severe mitral valve regurgitation. No evidence of mitral valve stenosis. The mean mitral valve gradient is 3.0 mmHg with average heart rate of 101 bpm.  Tricuspid Valve: The tricuspid valve is normal in structure. 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 No evidence of tricuspid stenosis.  Aortic Valve: The aortic valve is tricuspid. There is mild calcification of the aortic valve. Aortic valve regurgitation is mild. Aortic regurgitation PHT measures 451 msec. No aortic stenosis is present.  Pulmonic Valve: The pulmonic valve was normal in structure. Pulmonic valve regurgitation is trivial. No evidence of pulmonic stenosis.  Aorta: The aortic root is normal in size and structure.  Venous: The inferior vena cava is dilated in size with less than 50% respiratory variability, suggesting right atrial pressure of 15 mmHg. The inferior vena cava and the hepatic vein show a pattern of systolic flow reversal, suggestive of  tricuspid regurgitation.  IAS/Shunts: No atrial level shunt detected by color flow Doppler.  Additional Comments: There is pleural effusion in the right lateral region.   LEFT VENTRICLE PLAX 2D LVIDd:         4.80 cm LVIDs:         3.80 cm LV PW:         1.10 cm LV IVS:        1.20 cm LVOT diam:     2.10 cm LVOT Area:  3.46 cm   RIGHT VENTRICLE             IVC RV Basal diam:  3.60 cm     IVC diam: 2.60 cm RV S prime:     10.90 cm/s TAPSE (M-mode): 1.1 cm  LEFT ATRIUM              Index        RIGHT ATRIUM           Index LA diam:        5.10 cm  2.70 cm/m   RA Area:     29.70 cm LA Vol (A2C):   126.0 ml 66.67 ml/m  RA Volume:   104.00 ml 55.03 ml/m LA Vol (A4C):   94.0 ml  49.74 ml/m LA Biplane Vol: 109.0 ml 57.68 ml/m AORTIC VALVE AI PHT:      451 msec  AORTA Ao Root diam: 2.80 cm Ao Asc diam:  3.60 cm  MITRAL VALVE           TRICUSPID VALVE MV Mean grad: 3.0 mmHg TR Peak grad:   35.5 mmHg TR Vmax:        298.00 cm/s  SHUNTS Systemic Diam: 2.10 cm  Weston Brass MD Electronically signed by Weston Brass MD Signature Date/Time: 01/05/2023/8:37:43 PM    Final                  Assessment & Plan   Acute on chronic respiratory failure with hypoxia and hypercapnia Acute on chronic HFpEF with moderate right pleural effusion - multifocal with CAP, volume, and aspiration PNA - low BP, now s/p thoracentesis and chest tube - had issues with hypotension requiring pressors at last diuretic challenge but in the setting of aspiration PNA; will trial 40 IV lasix X1  Persistent atrial fibrillation Hypotension requiring midodrine and/or pressors - Continue IV amio - no improvement with digoxin earlier in the course (neither BP or AF) - holding metoprolol, if BP room would favor diuresis support over re-introduction of AF nodal agents   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      Riley Lam, MD FASE [facility] Cardiologist [facility]  [address] [place], [address] [place], [place] [address] [phone]  7:12 AM

## 2023-01-19 ENCOUNTER — Inpatient Hospital Stay (HOSPITAL_COMMUNITY): Payer: Medicare Other

## 2023-01-19 DIAGNOSIS — J9601 Acute respiratory failure with hypoxia: Secondary | ICD-10-CM | POA: Diagnosis not present

## 2023-01-19 DIAGNOSIS — J90 Pleural effusion, not elsewhere classified: Secondary | ICD-10-CM | POA: Diagnosis not present

## 2023-01-19 DIAGNOSIS — J9621 Acute and chronic respiratory failure with hypoxia: Secondary | ICD-10-CM | POA: Diagnosis not present

## 2023-01-19 DIAGNOSIS — J9602 Acute respiratory failure with hypercapnia: Secondary | ICD-10-CM | POA: Diagnosis not present

## 2023-01-19 DIAGNOSIS — J9622 Acute and chronic respiratory failure with hypercapnia: Secondary | ICD-10-CM | POA: Diagnosis not present

## 2023-01-19 LAB — BASIC METABOLIC PANEL WITH GFR
Anion gap: 12 (ref 5–15)
BUN: 12 mg/dL (ref 8–23)
CO2: 32 mmol/L (ref 22–32)
Calcium: 9.5 mg/dL (ref 8.9–10.3)
Chloride: 91 mmol/L — ABNORMAL LOW (ref 98–111)
Creatinine, Ser: 0.64 mg/dL (ref 0.44–1.00)
GFR, Estimated: >60 mL/min (ref >60)
Glucose, Bld: 107 mg/dL — ABNORMAL HIGH (ref 70–99)
Potassium: 4.1 mmol/L (ref 3.5–5.1)
Sodium: 135 mmol/L (ref 135–145)

## 2023-01-19 LAB — CBC
HCT: 40.4 % (ref 36.0–46.0)
Hemoglobin: 12.4 g/dL (ref 12.0–15.0)
MCH: 29 pg (ref 26.0–34.0)
MCHC: 30.7 g/dL (ref 30.0–36.0)
MCV: 94.6 fL (ref 80.0–100.0)
Platelets: 189 10*3/uL (ref 150–400)
RBC: 4.27 MIL/uL (ref 3.87–5.11)
RDW: 14.9 % (ref 11.5–15.5)
WBC: 8.9 10*3/uL (ref 4.0–10.5)
nRBC: 0 % (ref 0.0–0.2)

## 2023-01-19 MED ORDER — AMIODARONE HCL IN DEXTROSE 360-4.14 MG/200ML-% IV SOLN: 30.0000 mg/h | INTRAVENOUS | Status: DC

## 2023-01-19 MED ORDER — FUROSEMIDE 10 MG/ML IJ SOLN
80.0000 mg | Freq: Once | INTRAMUSCULAR | Status: AC
  Administered 2023-01-19: 80 mg via INTRAVENOUS
  Filled 2023-01-19: qty 8

## 2023-01-19 MED ORDER — AMIODARONE HCL 200 MG PO TABS: 200.0000 mg | ORAL_TABLET | Freq: Every day | ORAL | Status: DC

## 2023-01-19 MED ORDER — POTASSIUM CHLORIDE CRYS ER 20 MEQ PO TBCR
20.0000 meq | EXTENDED_RELEASE_TABLET | Freq: Once | ORAL | Status: AC
  Administered 2023-01-19: 20 meq via ORAL
  Filled 2023-01-19: qty 1

## 2023-01-19 MED ORDER — AMIODARONE HCL 200 MG PO TABS
200.0000 mg | ORAL_TABLET | Freq: Two times a day (BID) | ORAL | Status: DC
  Administered 2023-01-19: 200 mg via ORAL
  Filled 2023-01-19: qty 1

## 2023-01-19 MED ORDER — AMIODARONE HCL 200 MG PO TABS
200.0000 mg | ORAL_TABLET | Freq: Every day | ORAL | Status: DC
  Administered 2023-01-20 – 2023-01-23 (×4): 200 mg via ORAL
  Filled 2023-01-19 (×4): qty 1

## 2023-01-19 NOTE — Progress Notes (Signed)
Progress Note  Patient Name: [name] Date of Encounter: 01/19/2023 Primary Cardiologist: Sherilyn C[facility] (Ms. [name])  Subjective   Requiring BIPAP over night Tolerated low dose diuresis with no new pressor requirement  Vital Signs    Vitals:   01/19/23 0400 01/19/23 0500 01/19/23 0600 01/19/23 0700  BP: (!) 89/55 (!) 87/72 91/65 106/78  Pulse: (!) 121 (!) 114 (!) 117 (!) 112  Resp: (!) 25 (!) 24 (!) 25 (!) 21  Temp:      TempSrc:      SpO2: 93% 100% 100% 100%  Weight:  86.4 kg    Height:        Intake/Output Summary (Last 24 hours) at 01/19/2023 0751 Last data filed at 01/19/2023 0600 Gross per 24 hour  Intake 402.66 ml  Output 1680 ml  Net -1277.34 ml   Filed Weights   01/17/23 0500 01/18/23 0500 01/19/23 0500  Weight: 88.4 kg 86.4 kg 86.4 kg    Physical Exam   GEN: acute distress.  Ill appearing Neck:  JVD + Cardiac: IRIR tachycardia,systolic murmur  Respiratory: Regular rate on BIPAP, rhonchi and wheezes GI: Soft, nontender, non-distended  MS: Non-pitting edema  Labs   Telemetry: AF RVR rates improves   Chemistry Recent Labs  Lab 01/16/23 1254 01/17/23 0211 01/18/23 0307 01/19/23 0018  NA  --  135 134* 135  K  --  4.6 4.4 4.1  CL  --  94* 88* 91*  CO2  --  31 34* 32  GLUCOSE  --  109* 104* 107*  BUN  --  10 10 12   CREATININE  --  0.96 0.69 0.64  CALCIUM  --  8.9 9.6 9.5  PROT 6.2* 5.3*  --   --   ALBUMIN 2.4* 2.1*  --   --   AST  --  46*  --   --   ALT  --  34  --   --   ALKPHOS  --  117  --   --   BILITOT  --  0.7  --   --   GFRNONAA  --  59* >60 >60  ANIONGAP  --  10 12 12      Hematology Recent Labs  Lab 01/17/23 0211 01/18/23 0307 01/19/23 0018  WBC 8.5 9.6 8.9  RBC 3.99 4.62 4.27  HGB 11.9* 13.6 12.4  HCT 37.2 43.9 40.4  MCV 93.2 95.0 94.6  MCH 29.8 29.4 29.0  MCHC 32.0 31.0 30.7  RDW 14.9 15.0 14.9  PLT 169 193 189    Cardiac Studies   Cardiac Studies & Procedures        ECHOCARDIOGRAM  ECHOCARDIOGRAM COMPLETE 01/05/2023  Narrative ECHOCARDIOGRAM REPORT    Patient Name:   [name] Date of Exam: 01/05/2023 Medical Rec #:  [id number]      Height:       62.0 in Accession #:    [id number]     Weight:       194.7 lb Date of Birth:  1941/06/01      BSA:          1.890 m Patient Age:    82 years       BP:           102/63 mmHg Patient Gender: F              HR:           102 bpm. Exam Location:  Inpatient  Procedure: 2D Echo,  Cardiac Doppler and Color Doppler  Indications:    acute systolic chf  History:        Patient has prior history of Echocardiogram examinations, most recent 07/26/2013. History of Takotsubo, Arrythmias:Atrial Fibrillation; Signs/Symptoms:Shortness of Breath.  Sonographer:    Delcie Roch RDCS Referring Phys: [id number] CHING T TU  IMPRESSIONS   1. Left ventricular ejection fraction, by estimation, is 55% with beat to beat variability. The left ventricle has normal function. The left ventricle has no regional wall motion abnormalities. There is mild left ventricular hypertrophy. Left ventricular diastolic parameters are indeterminate. 2. Right ventricular systolic function is mildly reduced. The right ventricular size is moderately enlarged. There is moderately elevated pulmonary artery systolic pressure. The estimated right ventricular systolic pressure is 50.5 mmHg. 3. Left atrial size was severely dilated. 4. Right atrial size was severely dilated. 5. The mitral valve is degenerative. Severe mitral valve regurgitation. No evidence of mitral stenosis. Severe mitral annular calcification. 6. 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 7. The aortic valve is tricuspid. There is mild calcification of the aortic valve. Aortic valve regurgitation is mild. No aortic stenosis is present. 8. The inferior vena cava is dilated in size with <50% respiratory variability, suggesting right atrial pressure of 15 mmHg.  FINDINGS Left  Ventricle: Left ventricular ejection fraction, by estimation, is 55%. The left ventricle has normal function. The left ventricle has no regional wall motion abnormalities. The left ventricular internal cavity size was normal in size. There is mild left ventricular hypertrophy. Left ventricular diastolic parameters are indeterminate.  Right Ventricle: The right ventricular size is moderately enlarged. No increase in right ventricular wall thickness. Right ventricular systolic function is mildly reduced. There is moderately elevated pulmonary artery systolic pressure. The tricuspid regurgitant velocity is 2.98 m/s, and with an assumed right atrial pressure of 15 mmHg, the estimated right ventricular systolic pressure is 50.5 mmHg.  Left Atrium: Left atrial size was severely dilated.  Right Atrium: Right atrial size was severely dilated.  Pericardium: There is no evidence of pericardial effusion.  Mitral Valve: The mitral valve is degenerative in appearance. Severe mitral annular calcification. Severe mitral valve regurgitation. No evidence of mitral valve stenosis. The mean mitral valve gradient is 3.0 mmHg with average heart rate of 101 bpm.  Tricuspid Valve: The tricuspid valve is normal in structure. 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 No evidence of tricuspid stenosis.  Aortic Valve: The aortic valve is tricuspid. There is mild calcification of the aortic valve. Aortic valve regurgitation is mild. Aortic regurgitation PHT measures 451 msec. No aortic stenosis is present.  Pulmonic Valve: The pulmonic valve was normal in structure. Pulmonic valve regurgitation is trivial. No evidence of pulmonic stenosis.  Aorta: The aortic root is normal in size and structure.  Venous: The inferior vena cava is dilated in size with less than 50% respiratory variability, suggesting right atrial pressure of 15 mmHg. The inferior vena cava and the hepatic vein show a pattern of systolic flow reversal,  suggestive of tricuspid regurgitation.  IAS/Shunts: No atrial level shunt detected by color flow Doppler.  Additional Comments: There is pleural effusion in the right lateral region.   LEFT VENTRICLE PLAX 2D LVIDd:         4.80 cm LVIDs:         3.80 cm LV PW:         1.10 cm LV IVS:        1.20 cm LVOT diam:     2.10 cm LVOT Area:  3.46 cm   RIGHT VENTRICLE             IVC RV Basal diam:  3.60 cm     IVC diam: 2.60 cm RV S prime:     10.90 cm/s TAPSE (M-mode): 1.1 cm  LEFT ATRIUM              Index        RIGHT ATRIUM           Index LA diam:        5.10 cm  2.70 cm/m   RA Area:     29.70 cm LA Vol (A2C):   126.0 ml 66.67 ml/m  RA Volume:   104.00 ml 55.03 ml/m LA Vol (A4C):   94.0 ml  49.74 ml/m LA Biplane Vol: 109.0 ml 57.68 ml/m AORTIC VALVE AI PHT:      451 msec  AORTA Ao Root diam: 2.80 cm Ao Asc diam:  3.60 cm  MITRAL VALVE           TRICUSPID VALVE MV Mean grad: 3.0 mmHg TR Peak grad:   35.5 mmHg TR Vmax:        298.00 cm/s  SHUNTS Systemic Diam: 2.10 cm  Weston Brass MD Electronically signed by Weston Brass MD Signature Date/Time: 01/05/2023/8:37:43 PM    Final                  Assessment & Plan   Acute on chronic respiratory failure with hypoxia and hypercapnia Acute on chronic HFpEF with moderate right pleural effusion - multifocal with CAP, volume, and aspiration PNA - low BP, now s/p thoracentesis and chest tube - 80 IV lasix X1 today  Persistent atrial fibrillation Hypotension requiring midodrine and/or pressors - When off BIPAP can attempt transition to 200 mg PO daily if able to swallow ok - no improvement with digoxin earlier in the course (neither BP or AF) - holding metoprolol, if BP room would favor diuresis support over re-introduction of AF nodal agents   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      Riley Lam, MD FASE [facility] Cardiologist [facility]  [address] [place], [address] [place], [place] [address] [phone]  7:51 AM

## 2023-01-19 NOTE — Progress Notes (Signed)
   01/19/23 0810  BiPAP/CPAP/SIPAP  Reason BIPAP/CPAP not in use Other(comment) (standby.)   Pt taken off at this time and placed on 12L salter HFNC without complication.

## 2023-01-19 NOTE — Progress Notes (Signed)
NAME:  [name], MRN:  [id number], DOB:  May 19, 1941, LOS: 15 ADMISSION DATE:  01/04/2023, CONSULTATION DATE: 01/08/2023 REFERRING MD: Dr. Jomarie Longs, CHIEF COMPLAINT: Right pleural effusion  History of Present Illness:  66 F, history of A-fib, DVT/PE, Takotsubo cardiomyopathy with associated systolic CHF, CVA with residual deficits.  Admitted with weakness, dyspnea, lower extremity edema and encephalopathy on 7/27. Has been treated for volume overload but only moderate response to diuretics.  Right lower lobe infiltrate and severe volume loss consistent with pneumonia/atelectasis with coexisting right pleural effusion.  Underwent right thoracentesis with 1.1 L removed 8/1.  Then bronchoscopy on 8/2 for secretion clearance to hopefully resolve some of her right lower lobe and right middle lobe atelectasis.  She was intubated for that procedure, able to be extubated afterwards on 8/2.  She has required BiPAP on and off  Pertinent  Medical History  CVA. DVT, PE, Takotsubo CM, VF  Significant Hospital Events: Including procedures, antibiotic start and stop dates in addition to other pertinent events   Chest x-ray 01/09/2023-persistent atelectasis right lung Ultrasound chest 01/08/2023-right effusion, consolidation CT scan of the chest 01/09/2023-complete atelectasis right lung, consolidation left base Thoracentesis 8/1, 1.1 L removed, stopped due to chest discomfort Intubation for bronchoscopy 8/2 with right-sided secretions removed.  Extubated postprocedure 8/5 recurrent total right atelectasis, increased respiratory needs >> BiPAP 8/7 off pressors 8/8 Rt pig tail inserted  Interim History / Subjective:  Used Bipap overnight.  Feels short of breath this morning.  About 80 ml out of chest tube in last 24 hrs.  Objective   Blood pressure 106/78, pulse (!) 125, temperature (!) 97.4 F (36.3 C), temperature source Axillary, resp. rate 18, height 5\' 2"  (1.575 m), weight 86.4 kg, SpO2 96%.    FiO2  (%):  [50 %-60 %] 50 % PEEP:  [8 cmH20] 8 cmH20   Intake/Output Summary (Last 24 hours) at 01/19/2023 0855 Last data filed at 01/19/2023 0600 Gross per 24 hour  Intake 402.66 ml  Output 1200 ml  Net -797.34 ml   Filed Weights   01/17/23 0500 01/18/23 0500 01/19/23 0500  Weight: 88.4 kg 86.4 kg 86.4 kg    Examination:  General - alert Eyes - pupils reactive ENT - no sinus tenderness, no stridor Cardiac - irregular, tachycardic Chest - decreased BS on Rt, no air leak in Rt chest tube Abdomen - soft, non tender, + bowel sounds Extremities - no cyanosis, clubbing, or edema Skin - left leg in a wrap Neuro - follows commands  Resolved Hospital Problem list   Septic shock, Hyponatremia  Assessment & Plan:   Acute on chronic hypoxic, hypercapnic respiratory failure. - from RML and RLL CAP with parapneumonic effusion, acute on chronic combined CHF - DNR/DNI - high flow oxygen during the day to keep SpO2 > 90% - Bipap at bedtime and prn during the day - day 6 of ABx - change chest tube to -10 suction - repeat CT chest without contrast 8/12  Hypotension. - continue midorine  Acute on chronic combined CHF. Atrial fibrillation with RVR. 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 HLD. Hx of PE/DVT. - cardiology consulted - continue amiodarone - no additional procedures planned >> continue full dose lovenox for now; if stable then likely can transition to eliquis later next week; assess after reviewing CT chest on 8/12  Lt leg cellulitis. - continue ABx - wound care  Hx of gout. - continue allopurinol  Hx of depression. - continue wellbutrin  Peripheral neuropathy. - continue neurontin,  tramadol  Constipation. - continue bowel regimen  Best Practice:  Nutrition: Regular diet DVT: Lovenox SUP: Not indicated Lines: N/A Foley: N/A Code status: DNR/DNI Family discussion: pending  Labs:      Latest Ref Rng & Units 01/19/2023   12:18 AM 01/18/2023    3:07 AM 01/17/2023     2:11 AM  CMP  Glucose 70 - 99 mg/dL 295  284  132   BUN 8 - 23 mg/dL 12  10  10    Creatinine 0.44 - 1.00 mg/dL 4.40  1.02  7.25   Sodium 135 - 145 mmol/L 135  134  135   Potassium 3.5 - 5.1 mmol/L 4.1  4.4  4.6   Chloride 98 - 111 mmol/L 91  88  94   CO2 22 - 32 mmol/L 32  34  31   Calcium 8.9 - 10.3 mg/dL 9.5  9.6  8.9   Total Protein 6.5 - 8.1 g/dL   5.3   Total Bilirubin 0.3 - 1.2 mg/dL   0.7   Alkaline Phos 38 - 126 U/L   117   AST 15 - 41 U/L   46   ALT 0 - 44 U/L   34       Latest Ref Rng & Units 01/19/2023   12:18 AM 01/18/2023    3:07 AM 01/17/2023    2:11 AM  CBC  WBC 4.0 - 10.5 K/uL 8.9  9.6  8.5   Hemoglobin 12.0 - 15.0 g/dL 36.6  44.0  34.7   Hematocrit 36.0 - 46.0 % 40.4  43.9  37.2   Platelets 150 - 400 K/uL 189  193  169    ABG    Component Value Date/Time   PHART 7.345 (L) 01/14/2023 0022   PCO2ART 70.8 (HH) 01/14/2023 0022   PO2ART 63 (L) 01/14/2023 0022   HCO3 38.7 (H) 01/14/2023 0022   TCO2 41 (H) 01/14/2023 0022   ACIDBASEDEF 2.0 08/30/2009 2218   O2SAT 89 01/14/2023 0022    CBG (last 3)  Recent Labs    01/16/23 1936 01/17/23 0334  GLUCAP 114* 93    Signature:  Coralyn Helling, MD [place] [facility] Pager - [phone] or [phone] 01/19/2023, 8:55 AM

## 2023-01-20 ENCOUNTER — Inpatient Hospital Stay (HOSPITAL_COMMUNITY): Payer: Medicare Other

## 2023-01-20 DIAGNOSIS — J9622 Acute and chronic respiratory failure with hypercapnia: Secondary | ICD-10-CM | POA: Diagnosis not present

## 2023-01-20 DIAGNOSIS — J9621 Acute and chronic respiratory failure with hypoxia: Secondary | ICD-10-CM | POA: Diagnosis not present

## 2023-01-20 LAB — BASIC METABOLIC PANEL
Anion gap: 12 (ref 5–15)
BUN: 13 mg/dL (ref 8–23)
CO2: 36 mmol/L — ABNORMAL HIGH (ref 22–32)
Calcium: 9.9 mg/dL (ref 8.9–10.3)
Chloride: 89 mmol/L — ABNORMAL LOW (ref 98–111)
Creatinine, Ser: 0.83 mg/dL (ref 0.44–1.00)
GFR, Estimated: >60 mL/min (ref >60)
Glucose, Bld: 132 mg/dL — ABNORMAL HIGH (ref 70–99)
Potassium: 4.2 mmol/L (ref 3.5–5.1)
Sodium: 137 mmol/L (ref 135–145)

## 2023-01-20 LAB — PHOSPHORUS: Phosphorus: 4 mg/dL (ref 2.5–4.6)

## 2023-01-20 LAB — MAGNESIUM: Magnesium: 2.1 mg/dL (ref 1.7–2.4)

## 2023-01-20 MED ORDER — MIDODRINE HCL 5 MG PO TABS
10.0000 mg | ORAL_TABLET | Freq: Three times a day (TID) | ORAL | Status: DC
  Administered 2023-01-20 – 2023-01-30 (×31): 10 mg via ORAL
  Filled 2023-01-20 (×31): qty 2

## 2023-01-20 MED ORDER — MIDAZOLAM HCL 2 MG/2ML IJ SOLN: 4.0000 mg | INTRAMUSCULAR | Status: DC | PRN

## 2023-01-20 MED ORDER — FUROSEMIDE 10 MG/ML IJ SOLN
80.0000 mg | Freq: Two times a day (BID) | INTRAMUSCULAR | Status: DC
  Administered 2023-01-20 – 2023-01-21 (×4): 80 mg via INTRAVENOUS
  Filled 2023-01-20 (×5): qty 8

## 2023-01-20 MED ORDER — ALBUMIN HUMAN 25 % IV SOLN
25.0000 g | Freq: Four times a day (QID) | INTRAVENOUS | Status: AC
  Administered 2023-01-20 – 2023-01-21 (×4): 25 g via INTRAVENOUS
  Filled 2023-01-20 (×4): qty 100

## 2023-01-20 MED ORDER — SODIUM CHLORIDE 0.9% FLUSH: 10.0000 mL | Freq: Three times a day (TID) | INTRAVENOUS | Status: DC

## 2023-01-20 NOTE — Plan of Care (Signed)
Cardiology will follow peripherally, don't hesitate to reach out for questions.

## 2023-01-20 NOTE — Progress Notes (Signed)
Physical Therapy Treatment Patient Details Name: [name] MRN: [id number] DOB: 1940-06-23 Today's Date: 01/20/2023   History of Present Illness 82 y.o. female admitted 7/27 with weakness, SOB, AMS. Pt with Rt pleural effusion, s/p thoracentesis 8/1. 8/2 intubated, bronch and extubated. 8/4 Afib with RVR. 8/8 thoracentesis and chest tube placement. PMH: Takotsubo cardiomyopathy, CVA, DVT/PE, Vfib, chronic hypoxemic respiratory failure on 2L PRN , L LE non-healing wound/cellulitis    PT Comments  Pt greeted resting in bed and agreeable to session with steady progress towards acute goals. Pt continues to be limited by decreased activity tolerance and poor cardiopulmonary endurance. Pt able to come to sitting EOB with increased time and CGA for safety. Pt performing transfers and short gait in room (as pt limited by chest tube to wall suction) with grossly CGA with RW for support. Pt HR up to 134bpm with activity, resolving with seated rest at end of session. Current plan remains appropriate to address deficits and maximize functional independence and decrease caregiver burden. Pt continues to benefit from skilled PT services to progress toward functional mobility goals.      If plan is discharge home, recommend the following: A little help with walking and/or transfers;A little help with bathing/dressing/bathroom;Assist for transportation;Help with stairs or ramp for entrance;A lot of help with bathing/dressing/bathroom;Assistance with cooking/housework   Can travel by private vehicle     Yes  Equipment Recommendations  BSC/3in1    Recommendations for Other Services       Precautions / Restrictions Precautions Precautions: Fall Precaution Comments: watch SpO2, HFNC, R  posterolateral chest tube Restrictions Weight Bearing Restrictions: No     Mobility  Bed Mobility Overal bed mobility: Needs Assistance Bed Mobility: Supine to Sit     Supine to sit: HOB elevated, Contact guard      General bed mobility comments: CGA for safety    Transfers Overall transfer level: Needs assistance Equipment used: Rolling walker (2 wheels) Transfers: Sit to/from Stand Sit to Stand: Contact guard assist           General transfer comment: good recall for hand placement    Ambulation/Gait Ambulation/Gait assistance: Min assist Gait Distance (Feet): 4 Feet Assistive device: Rolling walker (2 wheels) Gait Pattern/deviations: Step-through pattern, Trunk flexed, Wide base of support       General Gait Details: cues for posture, proximity to RW and safety. Pt able to step foward and back a few feet to chair, limited by chest tube suction   Stairs             Wheelchair Mobility     Tilt Bed    Modified Rankin (Stroke Patients Only)       Balance Overall balance assessment: Needs assistance Sitting-balance support: No upper extremity supported, Feet supported Sitting balance-Leahy Scale: Fair     Standing balance support: Bilateral upper extremity supported, During functional activity, Reliant on assistive device for balance Standing balance-Leahy Scale: Poor Standing balance comment: dependent on RW                            Cognition Arousal: Alert Behavior During Therapy: WFL for tasks assessed/performed Overall Cognitive Status: Within Functional Limits for tasks assessed                                 General Comments: Pt hard of hearing, so intermittently needing repeated commands, but  overall pleasant, conversational, oriented.        Exercises General Exercises - Lower Extremity Long Arc Quad: AROM, Both, Seated, 10 reps Hip Flexion/Marching: AROM, Both, Seated, 10 reps    General Comments General comments (skin integrity, edema, etc.): HFNC 15L >93% throughout session, HR up to 134bpm with standing activity      Pertinent Vitals/Pain Pain Assessment Pain Assessment: No/denies pain Pain Intervention(s):  Monitored during session    Home Living                          Prior Function            PT Goals (current goals can now be found in the care plan section) Acute Rehab PT Goals Patient Stated Goal: get better PT Goal Formulation: With patient Time For Goal Achievement: 01/25/23 Progress towards PT goals: Progressing toward goals    Frequency    Min 1X/week      PT Plan      Co-evaluation              AM-PAC PT "6 Clicks" Mobility   Outcome Measure  Help needed turning from your back to your side while in a flat bed without using bedrails?: A Little Help needed moving from lying on your back to sitting on the side of a flat bed without using bedrails?: A Little Help needed moving to and from a bed to a chair (including a wheelchair)?: A Little Help needed standing up from a chair using your arms (e.g., wheelchair or bedside chair)?: A Little Help needed to walk in hospital room?: A Lot Help needed climbing 3-5 steps with a railing? : Total 6 Click Score: 15    End of Session Equipment Utilized During Treatment: Oxygen Activity Tolerance: Patient tolerated treatment well Patient left: in chair;with call bell/phone within reach Nurse Communication: Mobility status PT Visit Diagnosis: Muscle weakness (generalized) (M62.81);Difficulty in walking, not elsewhere classified (R26.2);Other abnormalities of gait and mobility (R26.89)     Time: 8469-6295 PT Time Calculation (min) (ACUTE ONLY): 28 min  Charges:    $Gait Training: 8-22 mins $Therapeutic Activity: 8-22 mins PT General Charges $$ ACUTE PT VISIT: 1 Visit                     Retha Bither R. PTA Acute Rehabilitation Services Office: [phone]   Catalina Antigua 01/20/2023, 11:52 AM

## 2023-01-20 NOTE — Progress Notes (Signed)
eLink Physician-Brief Progress Note Patient Name: [name] DOB: April 16, 1941 MRN: [id number]   Date of Service  01/20/2023  HPI/Events of Note  Chest tube management query. Chest tube clamped  eICU Interventions  Hold on flushes until tomorrow Change order for chest tube to clamp     Intervention Category Minor Interventions: Other:  [name] 01/20/2023, 9:25 PM

## 2023-01-20 NOTE — Plan of Care (Signed)

## 2023-01-20 NOTE — Plan of Care (Signed)
  Problem: Clinical Measurements: Goal: Will remain free from infection Outcome: Progressing Goal: Respiratory complications will improve Outcome: Progressing Goal: Cardiovascular complication will be avoided Outcome: Progressing   Problem: Coping: Goal: Level of anxiety will decrease Outcome: Progressing   

## 2023-01-20 NOTE — Progress Notes (Signed)
   NAME:  [name], MRN:  [id number], DOB:  10-Apr-1941, LOS: 16 ADMISSION DATE:  01/04/2023, CONSULTATION DATE: 01/08/2023 REFERRING MD: Dr. Jomarie Longs, CHIEF COMPLAINT: Right pleural effusion  History of Present Illness:  38 F, history of A-fib, DVT/PE, Takotsubo cardiomyopathy with associated systolic CHF, CVA with residual deficits.  Admitted with weakness, dyspnea, lower extremity edema and encephalopathy on 7/27. Has been treated for volume overload but only moderate response to diuretics.  Right lower lobe infiltrate and severe volume loss consistent with pneumonia/atelectasis with coexisting right pleural effusion.  Underwent right thoracentesis with 1.1 L removed 8/1.  Then bronchoscopy on 8/2 for secretion clearance to hopefully resolve some of her right lower lobe and right middle lobe atelectasis.  She was intubated for that procedure, able to be extubated afterwards on 8/2.  She has required BiPAP on and off  Pertinent  Medical History  CVA. DVT, PE, Takotsubo CM, VF  Significant Hospital Events: Including procedures, antibiotic start and stop dates in addition to other pertinent events   Chest x-ray 01/09/2023-persistent atelectasis right lung Ultrasound chest 01/08/2023-right effusion, consolidation CT scan of the chest 01/09/2023-complete atelectasis right lung, consolidation left base Thoracentesis 8/1, 1.1 L removed, stopped due to chest discomfort Intubation for bronchoscopy 8/2 with right-sided secretions removed.  Extubated postprocedure 8/5 recurrent total right atelectasis, increased respiratory needs >> BiPAP 8/7 off pressors 8/8 Rt pig tail inserted  Interim History / Subjective:  No events, some dyspnea with any minimal exertion.  Objective   Blood pressure 97/64, pulse (!) 122, temperature 98.3 F (36.8 C), temperature source Axillary, resp. rate (!) 28, height 5\' 2"  (1.575 m), weight 86 kg, SpO2 93%.    Vent Mode: PCV;BIPAP FiO2 (%):  [50 %-80 %] 70 % Set Rate:   [20 bmp] 20 bmp   Intake/Output Summary (Last 24 hours) at 01/20/2023 2440 Last data filed at 01/20/2023 0600 Gross per 24 hour  Intake 86.09 ml  Output 2880 ml  Net -2793.91 ml   Filed Weights   01/18/23 0500 01/19/23 0500 01/20/23 0500  Weight: 86.4 kg 86.4 kg 86 kg    Examination: No distress Scattered bruising Globally weak 1+ pitting edema +murmur  CT reviewed, small residual PTX, RLL remains down with maybe a very distal mucus plug   Resolved Hospital Problem list   Septic shock, Hyponatremia  Assessment & Plan:   Acute on chronic hypoxic, hypercapnic respiratory failure. Hypotension/vasoplegia CAP RLL complicated by parapneumonic effusion- pigtail in place, small PTX, completed course of abx Acute on chronic combined CHF. Atrial fibrillation with RVR. 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 HLD. Hx of PE/DVT. Lt leg cellulitis- currently wrapped, looking better per RN, continue local wound care, completed course of abx Hx gout, depression, constipation, peripheral neuropathy- continue PTA wellbutrin, allopurinol, neurontin, tramadol  - continue midodrine, amio - continue full dose lovenox - push diuresis today, increase midodrine - clamp chest tube, CXR tomorrow am or PRN increased WOB - flutter, IS - bowel regimen - will see how responds to aggressive diuresis, if BP's or kidneys don't tolerate and she remains at this much O2, we may be at end of life and need to start thinking about hospice  Best Practice:  Nutrition: Regular diet DVT: Lovenox SUP: Not indicated Lines: N/A Foley: N/A Code status: DNR/DNI Family discussion: pending  Myrla Halsted MD PCCM

## 2023-01-21 ENCOUNTER — Inpatient Hospital Stay (HOSPITAL_COMMUNITY): Payer: Medicare Other

## 2023-01-21 DIAGNOSIS — J9622 Acute and chronic respiratory failure with hypercapnia: Secondary | ICD-10-CM | POA: Diagnosis not present

## 2023-01-21 DIAGNOSIS — Z7189 Other specified counseling: Secondary | ICD-10-CM

## 2023-01-21 DIAGNOSIS — Z515 Encounter for palliative care: Secondary | ICD-10-CM

## 2023-01-21 DIAGNOSIS — J9621 Acute and chronic respiratory failure with hypoxia: Secondary | ICD-10-CM | POA: Diagnosis not present

## 2023-01-21 MED ORDER — STERILE WATER FOR INJECTION IJ SOLN
INTRAMUSCULAR | Status: AC
  Filled 2023-01-21: qty 10

## 2023-01-21 MED ORDER — APIXABAN 5 MG PO TABS: 5.0000 mg | ORAL_TABLET | Freq: Two times a day (BID) | ORAL | Status: DC

## 2023-01-21 MED ORDER — ACETAZOLAMIDE SODIUM 500 MG IJ SOLR
250.0000 mg | Freq: Two times a day (BID) | INTRAMUSCULAR | Status: AC
  Administered 2023-01-21 (×2): 250 mg via INTRAVENOUS
  Filled 2023-01-21 (×3): qty 250

## 2023-01-21 MED ORDER — APIXABAN 5 MG PO TABS
5.0000 mg | ORAL_TABLET | Freq: Two times a day (BID) | ORAL | Status: DC
  Administered 2023-01-21 – 2023-01-30 (×18): 5 mg via ORAL
  Filled 2023-01-21 (×19): qty 1

## 2023-01-21 NOTE — Progress Notes (Signed)
Brief Nutrition Note  Informed by RN that pt does not like the boost breeze flavors and reports that she likes strawberry. Visited at bedside and pt reports that the wild berry flavor is "too wild." Advised it was the only flavor available and that ensure was the only strawberry flavored supplement we had which pt does not like. Pt reports that she is eating the magic cup. Will removed boost breeze and allow pt to continue to eat her meals and consume magic cup.   Greig Castilla, RD, LDN Clinical Dietitian RD pager # available in AMION  After hours/weekend pager # available in [facility]

## 2023-01-21 NOTE — Consult Note (Signed)
Palliative Medicine Inpatient Consult Note  Consulting Provider: Dr. Katrinka Blazing  Reason for consult:   Palliative Care Consult Services Palliative Medicine Consult  Reason for Consult? i cant bring myself to have EOL talk with this sweetheart   01/21/2023  HPI:  Per intake H&P --> 82 F, history of A-fib, DVT/PE, Takotsubo cardiomyopathy with associated systolic CHF, CVA with residual deficits. Admitted with weakness, dyspnea, lower extremity edema and encephalopathy on 7/27.   Palliative care consulted to discuss goals of care in the setting of chronic disease and severe illness.  Clinical Assessment/Goals of Care:  *Please note that this is a verbal dictation therefore any spelling or grammatical errors are due to the "Dragon Medical One" system interpretation.  I have reviewed medical records including EPIC notes, labs and imaging, received report from bedside RN, assessed the patient who is sitting up in bed noted to be on 15 L high flow nasal cannula.    I met with [name] to further discuss diagnosis prognosis, GOC, EOL wishes, disposition and options.   I introduced Palliative Medicine as specialized medical care for people living with serious illness. It focuses on providing relief from the symptoms and stress of a serious illness. The goal is to improve quality of life for both the patient and the family.  Medical History Review and Understanding:  Reviewed patient's history of atrial fibrillation, pulmonary embolism, cardiomyopathy, CVA, and recent pneumonia.  Social History:  [name] shares that [place] is [place].  She had been married though was separated from her husband at the time of his passing.  She has 2 sons, 1 daughter, 13 grandchildren, and 24 great-grandchildren.  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  She gets enjoyment out of gardening flowers specifically.   She is a woman of faith and practices within the [facility] denomination.  Functional and Nutritional State:  Preceding hospitalization [name] was living in a single-family home with her oldest son.  He plans to move out in the oncoming weeks though does have a home located across the street from her therefore we will be checking frequently.  She was able to perform all B ADLs on her own and did use a cane for mobility.  Advance Directives:  A detailed discussion was had today regarding advanced directives.  [name] shares that her 3 children help with decision-making.  [name] did make advanced directives and would not want life-prolonging heroic measures -I shared I would try to obtain a copy through speaking with her family.  Code Status:  Concepts specific to code status, artifical feeding and hydration, continued IV antibiotics and rehospitalization was had.  The difference between a aggressive medical intervention path  and a palliative comfort care path for this patient at this time was had.   [name] is an established DO NOT RESUSCITATE DO NOT INTUBATE CODE STATUS.  Discussion:  Open and honest conversations were held in the setting of [name]'s prolonged hospital stay and severe pneumonia.  In addition we reviewed her heart failure and how this is playing into her overall health function.  Best case and worst-case scenarios were discussed inclusive of in the best case improvement off of high flow nasal cannula, mobility, adequate oral intake, and the ability to go to a rehab facility to optimize strength.  Worst case scenarios included inability to wean off oxygen and worsening respiratory function.  I did gently introduce the  topic of comfort care if the worst case scenario proves to be the reality.  [name] is clear that quality of life to her involves her being able to stand up and walk around as well as interact with her family.  If unable to do that she would not find that there is  quality in her life.  We reviewed allowing time for outcomes and additional conversations with [name] and her family in the oncoming days.  Discussed the importance of continued conversation with family and their  medical providers regarding overall plan of care and treatment options, ensuring decisions are within the context of the patients values and GOCs.  Decision Maker: [name] (Daughter) [phone] (Home Phone)   SUMMARY OF RECOMMENDATIONS   DNAR/DNI  Open and honest conversations in the setting patients significant PNA and heart failure  Allowing time for outcomes  Ongoing PMT support  Code Status/Advance Care Planning: DNAR/DNI  Palliative Prophylaxis:  Aspiration, Bowel Regimen, Delirium Protocol, Frequent Pain Assessment, Oral Care, Palliative Wound Care, and Turn Reposition  Additional Recommendations (Limitations, Scope, Preferences): Continue current care  Psycho-social/Spiritual:  Desire for further Chaplaincy support: Yes - is [facility] though her past visits Additional Recommendations: Education on PNA progression   Prognosis: Worrisome if unable to wean off O2.  Discharge Planning: Unclear   Vitals:   01/21/23 0726 01/21/23 0800  BP:  (!) 93/59  Pulse:    Resp:  19  Temp: 97.8 F (36.6 C)   SpO2:      Intake/Output Summary (Last 24 hours) at 01/21/2023 9528 Last data filed at 01/21/2023 0800 Gross per 24 hour  Intake 765.44 ml  Output 4400 ml  Net -3634.56 ml   Last Weight  Most recent update: 01/21/2023  5:09 AM    Weight  82.8 kg (182 lb 8.7 oz)            Gen:  Elderly Caucasian F, in moderate distress HEENT: moist mucous membranes CV: Irregular rate and rhythm  PULM: On 15LPM HFNC, breathing is even and nonlabored ABD: soft/nontender  EXT: Pedal edema  Neuro: Alert and oriented x3   PPS: 40%   This conversation/these recommendations were discussed with patient primary care team, Dr. Katrinka Blazing  Billing based on MDM:  High  Problems Addressed: One acute or chronic illness or injury that poses a threat to life or bodily function  Amount and/or Complexity of Data: Category 3:Discussion of management or test interpretation with external physician/other qualified health care professional/appropriate source (not separately reported)  Risks: Decision not to resuscitate or to de-escalate care because of poor prognosis ______________________________________________________ Lamarr Lulas [place] Palliative Medicine Team Team Cell Phone: [phone] Please utilize secure chat with additional questions, if there is no response within 30 minutes please call the above phone number  Palliative Medicine Team providers are available by phone from 7am to 7pm daily and can be reached through the team cell phone.  Should this patient require assistance outside of these hours, please call the patient's attending physician.

## 2023-01-21 NOTE — Care Management (Signed)
Received call from [facility] and informed that patient is active for [facility] RN, they are able to add any disciplines needed if patient discharges with home health services.

## 2023-01-21 NOTE — Plan of Care (Signed)

## 2023-01-21 NOTE — Progress Notes (Signed)
   NAME:  [name], MRN:  [id number], DOB:  1940-12-06, LOS: 17 ADMISSION DATE:  01/04/2023, CONSULTATION DATE: 01/08/2023 REFERRING MD: Dr. Jomarie Longs, CHIEF COMPLAINT: Right pleural effusion  History of Present Illness:  84 F, history of A-fib, DVT/PE, Takotsubo cardiomyopathy with associated systolic CHF, CVA with residual deficits.  Admitted with weakness, dyspnea, lower extremity edema and encephalopathy on 7/27. Has been treated for volume overload but only moderate response to diuretics.  Right lower lobe infiltrate and severe volume loss consistent with pneumonia/atelectasis with coexisting right pleural effusion.  Underwent right thoracentesis with 1.1 L removed 8/1.  Then bronchoscopy on 8/2 for secretion clearance to hopefully resolve some of her right lower lobe and right middle lobe atelectasis.  She was intubated for that procedure, able to be extubated afterwards on 8/2.  She has required BiPAP on and off  Pertinent  Medical History  CVA. DVT, PE, Takotsubo CM, VF  Significant Hospital Events: Including procedures, antibiotic start and stop dates in addition to other pertinent events   Chest x-ray 01/09/2023-persistent atelectasis right lung Ultrasound chest 01/08/2023-right effusion, consolidation CT scan of the chest 01/09/2023-complete atelectasis right lung, consolidation left base Thoracentesis 8/1, 1.1 L removed, stopped due to chest discomfort Intubation for bronchoscopy 8/2 with right-sided secretions removed.  Extubated postprocedure 8/5 recurrent total right atelectasis, increased respiratory needs >> BiPAP 8/7 off pressors 8/8 Rt pig tail inserted  Interim History / Subjective:  No events, overall feels a little better. Good diuresis  Objective   Blood pressure (!) 93/54, pulse (!) 226, temperature 97.8 F (36.6 C), temperature source Oral, resp. rate (!) 34, height 5\' 2"  (1.575 m), weight 82.8 kg, SpO2 90%.    Vent Mode: PCV;BIPAP FiO2 (%):  [70 %] 70 % Set Rate:   [20 bmp] 20 bmp Vt Set:  [400 mL] 400 mL PEEP:  [8 cmH20] 8 cmH20 Pressure Support:  [14 cmH20] 14 cmH20   Intake/Output Summary (Last 24 hours) at 01/21/2023 0741 Last data filed at 01/21/2023 0600 Gross per 24 hour  Intake 725.44 ml  Output 4400 ml  Net -3674.56 ml   Filed Weights   01/19/23 0500 01/20/23 0500 01/21/23 0500  Weight: 86.4 kg 86 kg 82.8 kg    Examination: No distress Improved edema Reduced breath sounds bases Globally weak Pigtail in place  CXR stable with 24h clamp trial  Resolved Hospital Problem list   Septic shock, Hyponatremia  Assessment & Plan:   Acute on chronic hypoxic, hypercapnic respiratory failure. Hypotension/vasoplegia CAP RLL complicated by parapneumonic effusion- pigtail in place, small PTX, completed course of abx Acute on chronic combined CHF. Atrial fibrillation with RVR. 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 HLD. Hx of PE/DVT. Lt leg cellulitis- currently wrapped, looking better per RN, continue local wound care, completed course of abx Hx gout, depression, constipation, peripheral neuropathy- continue PTA wellbutrin, allopurinol, neurontin, tramadol  - continue midodrine, amio - transition to eliquis, dc lovenox - continue diuresis as tolerated by renal function and BP - DC chest tube - flutter, IS - bowel regimen - I started conversation about EOL, she seemed open to talking to palliative team, I appreciate their help in continuing this conversation - wean O2 as able, encourage nutrition and mobility  Best Practice:  Nutrition: Regular diet DVT: eliquis SUP: Not indicated Lines: N/A Foley: N/A Code status: DNR/DNI  Stable to transfer to floor, appreciate TR[facility] taking over starting 01/22/23 Will follow from pulm perspective  Myrla Halsted MD PCCM  Myrla Halsted MD PCCM

## 2023-01-22 DIAGNOSIS — I5033 Acute on chronic diastolic (congestive) heart failure: Secondary | ICD-10-CM | POA: Diagnosis not present

## 2023-01-22 DIAGNOSIS — J9621 Acute and chronic respiratory failure with hypoxia: Secondary | ICD-10-CM | POA: Diagnosis not present

## 2023-01-22 DIAGNOSIS — I4819 Other persistent atrial fibrillation: Secondary | ICD-10-CM | POA: Diagnosis not present

## 2023-01-22 DIAGNOSIS — E876 Hypokalemia: Secondary | ICD-10-CM | POA: Diagnosis not present

## 2023-01-22 DIAGNOSIS — J9622 Acute and chronic respiratory failure with hypercapnia: Secondary | ICD-10-CM | POA: Diagnosis not present

## 2023-01-22 DIAGNOSIS — I4891 Unspecified atrial fibrillation: Secondary | ICD-10-CM | POA: Diagnosis not present

## 2023-01-22 LAB — CBC
HCT: 37.4 % (ref 36.0–46.0)
Hemoglobin: 11.5 g/dL — ABNORMAL LOW (ref 12.0–15.0)
MCH: 28.8 pg (ref 26.0–34.0)
MCHC: 30.7 g/dL (ref 30.0–36.0)
MCV: 93.5 fL (ref 80.0–100.0)
Platelets: 210 10*3/uL (ref 150–400)
RBC: 4 MIL/uL (ref 3.87–5.11)
RDW: 14.8 % (ref 11.5–15.5)
WBC: 7.9 10*3/uL (ref 4.0–10.5)
nRBC: 0 % (ref 0.0–0.2)

## 2023-01-22 LAB — MAGNESIUM: Magnesium: 2.1 mg/dL (ref 1.7–2.4)

## 2023-01-22 LAB — BASIC METABOLIC PANEL
Anion gap: 13 (ref 5–15)
BUN: 18 mg/dL (ref 8–23)
CO2: 37 mmol/L — ABNORMAL HIGH (ref 22–32)
Calcium: 9.8 mg/dL (ref 8.9–10.3)
Chloride: 87 mmol/L — ABNORMAL LOW (ref 98–111)
Creatinine, Ser: 0.86 mg/dL (ref 0.44–1.00)
GFR, Estimated: >60 mL/min (ref >60)
Glucose, Bld: 109 mg/dL — ABNORMAL HIGH (ref 70–99)
Potassium: 2.9 mmol/L — ABNORMAL LOW (ref 3.5–5.1)
Sodium: 137 mmol/L (ref 135–145)

## 2023-01-22 MED ORDER — POTASSIUM CHLORIDE CRYS ER 20 MEQ PO TBCR
40.0000 meq | EXTENDED_RELEASE_TABLET | ORAL | Status: AC
  Administered 2023-01-22 (×3): 40 meq via ORAL
  Filled 2023-01-22 (×3): qty 2

## 2023-01-22 MED ORDER — ACETAZOLAMIDE 250 MG PO TABS
250.0000 mg | ORAL_TABLET | Freq: Every day | ORAL | Status: AC
  Administered 2023-01-22 – 2023-01-23 (×2): 250 mg via ORAL
  Filled 2023-01-22 (×2): qty 1

## 2023-01-22 MED ORDER — ORAL CARE MOUTH RINSE: 15.0000 mL | OROMUCOSAL | Status: DC | PRN

## 2023-01-22 MED ORDER — FUROSEMIDE 40 MG PO TABS
40.0000 mg | ORAL_TABLET | Freq: Every day | ORAL | Status: DC
  Administered 2023-01-22: 40 mg via ORAL
  Filled 2023-01-22: qty 1

## 2023-01-22 MED ORDER — PANTOPRAZOLE SODIUM 40 MG PO TBEC
40.0000 mg | DELAYED_RELEASE_TABLET | Freq: Every day | ORAL | Status: DC
  Administered 2023-01-22 – 2023-01-30 (×9): 40 mg via ORAL
  Filled 2023-01-22 (×9): qty 1

## 2023-01-22 NOTE — Progress Notes (Deleted)
eLink Physician-Brief Progress Note Patient Name: [name] DOB: Mar 27, 1941 MRN: [id number]   Date of Service  01/22/2023  HPI/Events of Note  Patient AFIB in the 120's to 140's. Does not have anything prn ordered. HR seems to have trended as she started waking up  On phenylephrine  Patient does not have any morning labs or PCCM electrolyte protocol orders  eICU Interventions  AM CBC and electrolytes ordered Electrolyte replacement protocol already in place     Intervention Category Intermediate Interventions: Arrhythmia - evaluation and management;Electrolyte abnormality - evaluation and management  Darl Pikes 01/22/2023, 4:15 AM

## 2023-01-22 NOTE — Progress Notes (Addendum)
PROGRESS NOTE   [name]  NWG:[id number]    DOB: [birth year]    DOA: 01/04/2023  PCP: Herma Carson, MD   I have briefly reviewed patients previous medical records in [facility].  Chief Complaint  Patient presents with   Irregular Heart Beat    Brief Narrative:  82 year old female, PMH of A-fib, DVT/PE, Takotsubo cardiomyopathy, V-fib, chronic systolic CHF, CVA with residual deficits, presented 01/04/2023 with complaints of weakness, dyspnea, lower extremity edema and encephalopathy.  Admitted for acute on chronic hypoxic and hypercapnic respiratory failure, RLL CAP complicated by parapneumonic effusion requiring chest tube, acute on chronic combined CHF, A-fib with RVR and left leg cellulitis.  She had been transferred to ICU under PCCM care.  Transferred back to [facility] 8/14.  Assessment & Plan:  Principal Problem:   Acute on chronic respiratory failure with hypoxia and hypercapnia (HCC) Active Problems:   CAP (community acquired pneumonia)   Atrial fibrillation with rapid ventricular response (HCC)   Hypotension   Cellulitis   Acute congestive heart failure (HCC)   Acute respiratory failure with hypoxia and hypercapnia (HCC)   Aspiration into airway   Pressure injury of skin   Pleural effusion, left   Pleural effusion   Acute on chronic hypoxic, hypercapnic respiratory failure. CAP RLL complicated by parapneumonic effusion Acute on chronic combined CHF. Required right-sided chest tube, removed 8/13 Needing BiPAP at night and [place] oxygen during daytime.  Wean O2 down as tolerated. Aggressive pulmonary toilet including flutter valve, incentive spirometry and mobilize. Completed course of antibiotics. Recommend repeating chest x-ray in 4 weeks. See CHF discussion below. Palliative care input 8/13 appreciated.  DNR DNI confirmed.  Ongoing conversations regarding GOC.  Acute on chronic combined CHF: Improving on IV Lasix. -6.8 L thus far. Appears mildly volume  overloaded.  Continue IV Lasix.  Creatinine normal/stable. Consider transitioning to oral Lasix in the next day or 2.  Hypokalemia: Potassium 2.9, due to aggressive IV diuresis. Magnesium 2.1. Replace p.o. potassium aggressively and follow BMP in AM.  Atrial fibrillation with RVR. Continue amiodarone 200 Mg daily. Remains with mild RVR in the 100s-110s. Unable to add beta-blockers or calcium channel blockers, limited by hypotension. Consulted cardiology again 8/14.?  Increase amiodarone, versus add digoxin.  Per cardiology note from 8/11, did not respond to digoxin.  Cardiology signed off 8/12. Continue apixaban.  Hypotension: SBP consistently in the 80s-90s. Continue midodrine 10 Mg 3 times daily. Asymptomatic.  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  Outpatient follow-up with cardiology.  HLD. Currently not on meds.  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  Lt leg cellulitis Currently wrapped. Per prior RN report, looked good. Completed course of antibiotics.  Hx gout, depression, constipation, peripheral neuropathy continue PTA wellbutrin, allopurinol, neurontin, tramadol  Body mass index is 32.74 kg/m. Obesity/complicates care.  Outpatient follow-up.  Nutritional Status Nutrition Problem: Increased nutrient needs Etiology: wound healing Signs/Symptoms: estimated needs Interventions: Boost Breeze, MVI, Liberalize Diet  Pressure Ulcer: Pressure Injury 01/04/23 Pretibial Left Stage 2 -  Partial thickness loss of dermis presenting as a shallow open injury with a red, pink wound bed without slough. (Active)  01/04/23 2200  Location: Pretibial  Location Orientation: Left  Staging: Stage 2 -  Partial thickness loss of dermis presenting as a shallow open injury with a red, pink wound bed without slough.  Wound Description (Comments):   Present on Admission: Yes  Dressing Type Gauze (Comment) 01/21/23 2000    ACP Documents: None present DVT prophylaxis:  Full dose apixaban   Code Status: DNR:  Family Communication: None at bedside Disposition:  Status is: Inpatient Remains inpatient appropriate because: Ongoing requirement for BiPAP at night, high [place] oxygen during daytime, IV Lasix, severe hypokalemia     Consultants:   PCCM Cardiology Palliative care medicine  Procedures:   Chest x-ray 01/09/2023-persistent atelectasis right lung Ultrasound chest 01/08/2023-right effusion, consolidation CT scan of the chest 01/09/2023-complete atelectasis right lung, consolidation left base Thoracentesis 8/1, 1.1 L removed, stopped due to chest discomfort Intubation for bronchoscopy 8/2 with right-sided secretions removed.  Extubated postprocedure 8/5 recurrent total right atelectasis, increased respiratory needs >> BiPAP 8/7 off pressors 8/8 Rt pig tail inserted 8/13 right pigtail chest tube removed. Ongoing nightly BiPAP and [place] oxygen during the daytime.  Antimicrobials:   Completed course   Subjective:  Denies complaints.  Denies dyspnea.  States that she feels fine.  Objective:   Vitals:   01/22/23 0300 01/22/23 0400 01/22/23 0500 01/22/23 0750  BP:  (!) 79/62    Pulse: (!) 103 (!) 110    Resp: 15 (!) 23    Temp:  97.8 F (36.6 C)  98.4 F (36.9 C)  TempSrc:  Oral  Axillary  SpO2: 95% 99%    Weight:   81.2 kg   Height:        General exam: Elderly female, small built and frail, chronically ill looking, sitting up in bed eating breakfast.  No distress noted. Respiratory system: Breath sounds in the right lung fields, mostly in the base.  Otherwise clear to auscultation.  No wheezing or rhonchi.  No increased work of breathing. Cardiovascular system: S1 & S2 heard, irregular irregular and mildly tachycardic. No JVD, murmurs, rubs, gallops or clicks.  Trace ankle IMA.  Telemetry personally reviewed: A-fib with RVR in the 110s-120s. Gastrointestinal system: Abdomen is nondistended, soft and nontender. No organomegaly or masses felt.  Normal bowel sounds heard. Central nervous system: Alert and oriented. No focal neurological deficits.  Hard of hearing. Extremities: Symmetric 5 x 5 power.  Left lower leg wrapped, dressing clean and dry. Skin: No rashes, lesions or ulcers.  Bruising of upper extremities. Psychiatry: Judgement and insight appear normal. Mood & affect appropriate.     Data Reviewed:   I have personally reviewed following labs and imaging studies   CBC: Recent Labs  Lab 01/17/23 0211 01/18/23 0307 01/19/23 0018 01/22/23 0631  WBC 8.5 9.6 8.9 7.9  NEUTROABS 6.7  --   --   --   HGB 11.9* 13.6 12.4 11.5*  HCT 37.2 43.9 40.4 37.4  MCV 93.2 95.0 94.6 93.5  PLT 169 193 189 210    Basic Metabolic Panel: Recent Labs  Lab 01/16/23 0203 01/16/23 0205 01/16/23 0205 01/17/23 0211 01/18/23 0307 01/19/23 0018 01/20/23 0844 01/22/23 0631  NA  --  134*   < > 135 134* 135 137 137  K  --  3.2*   < > 4.6 4.4 4.1 4.2 2.9*  CL  --  91*   < > 94* 88* 91* 89* 87*  CO2  --  32   < > 31 34* 32 36* 37*  GLUCOSE  --  94   < > 109* 104* 107* 132* 109*  BUN  --  7*   < > 10 10 12 13 18   CREATININE  --  0.65   < > 0.96 0.69 0.64 0.83 0.86  CALCIUM  --  8.8*   < > 8.9 9.6 9.5 9.9 9.8  MG  --  1.8  --  2.1  --   --  2.1 2.1  PHOS 3.0  --   --   --   --   --  4.0  --    < > = values in this interval not displayed.    Liver Function Tests: Recent Labs  Lab 01/16/23 1254 01/17/23 0211  AST  --  46*  ALT  --  34  ALKPHOS  --  117  BILITOT  --  0.7  PROT 6.2* 5.3*  ALBUMIN 2.4* 2.1*    CBG: Recent Labs  Lab 01/16/23 0353 01/16/23 1936 01/17/23 0334  GLUCAP 106* 114* 93    Microbiology Studies:   Recent Results (from the past 240 hour(s))  Body fluid culture w Gram Stain     Status: None   Collection Time: 01/16/23 10:30 AM   Specimen: Pleural Fluid  Result Value Ref Range Status   Specimen Description PLEURAL  Final   Special Requests left  Final   Gram Stain   Final    FEW WBC PRESENT,  PREDOMINANTLY MONONUCLEAR NO ORGANISMS SEEN    Culture   Final    NO GROWTH 3 DAYS Performed at [facility], [address] [place], [place] [address]    Report Status 01/19/2023 FINAL  Final  Body fluid culture w Gram Stain     Status: None   Collection Time: 01/16/23 12:11 PM   Specimen: Peritoneal Washings; Body Fluid  Result Value Ref Range Status   Specimen Description PERITONEAL  Final   Special Requests right  Final   Gram Stain   Final    FEW WBC PRESENT,BOTH PMN AND MONONUCLEAR NO ORGANISMS SEEN    Culture   Final    NO GROWTH 3 DAYS Performed at [facility], [address], [place], [place] [address]    Report Status 01/19/2023 FINAL  Final    Radiology Studies:  DG Chest Port 1 View  Result Date: 01/21/2023 CLINICAL DATA:  Pneumothorax EXAM: PORTABLE CHEST 1 VIEW COMPARISON:  01/20/2023 FINDINGS: Interval repositioning of right-sided pigtail chest tube, tip remains about the right lung base. Complete consolidation of the right lower lobe and elevation of the right hemidiaphragm. No visible pneumothorax. Small layering left pleural effusion. Cardiomegaly. Osseous structures unremarkable. IMPRESSION: 1. Interval repositioning of right-sided pigtail chest tube, tip remains about the right lung base. No visible pneumothorax. 2. Complete consolidation of the right lower lobe and elevation of the right hemidiaphragm. 3. Small layering left pleural effusion. 4. Cardiomegaly. Electronically Signed   By: Jearld Lesch M.D.   On: 01/21/2023 09:51   DG CHEST PORT 1 VIEW  Result Date: 01/20/2023 CLINICAL DATA:  Pleural effusion EXAM: PORTABLE CHEST 1 VIEW COMPARISON:  01/19/2023 FINDINGS: Shallow inspiration. Diffuse cardiac enlargement with mild vascular congestion. Bilateral pleural effusions with basilar atelectasis or consolidation. A right chest tube is in place without change in position since prior study. No visible residual pneumothorax. Tortuous aorta. Overall  stable appearance since previous study. IMPRESSION: 1. Bilateral pleural effusions with basilar atelectasis or consolidation. 2. Right chest tube remains in place. No significant residual pneumothorax. 3. Cardiac enlargement with pulmonary vascular congestion Electronically Signed   By: Burman Nieves M.D.   On: 01/20/2023 17:44    Scheduled Meds:    allopurinol  100 mg Oral Daily   amiodarone  200 mg Oral Daily   apixaban  5 mg Oral Q12H   buPROPion  300 mg  Oral Daily   calcium carbonate  1 tablet Oral BID WC   Chlorhexidine Gluconate Cloth  6 each Topical Daily   cholecalciferol  1,000 Units Oral Daily   cyanocobalamin  1,000 mcg Oral Daily   fluticasone  2 spray Each Nare Daily   furosemide  80 mg Intravenous Q12H   gabapentin  400 mg Oral Daily   guaiFENesin  600 mg Oral BID   midodrine  10 mg Oral TID WC   multivitamin with minerals  1 tablet Oral Daily   pneumococcal 20-valent conjugate vaccine  0.5 mL Intramuscular Tomorrow-1000   polyethylene glycol  17 g Oral BID   senna-docusate  2 tablet Oral BID   traMADol  50 mg Oral BID    Continuous Infusions:    sodium chloride Stopped (01/14/23 0222)     LOS: 18 days     Marcellus Scott, MD,  FACP, FHM, SFHM, [facility], [facility]   [facility] [place]     To contact the attending provider between 7A-7P or the covering provider during after hours 7P-7A, please log into the web site [email or web address] and access using universal [place] password for that web site. If you do not have the password, please call the hospital operator.  01/22/2023, 8:11 AM

## 2023-01-22 NOTE — TOC Initial Note (Signed)
Transition of Care [facility]) - Initial/Assessment Note    Patient Details  Name: [name] MRN: [id number] Date of Birth: 14-Jun-1940  Transition of Care [facility]) CM/SW Contact:    Lorri Frederick, LCSW Phone Number: 01/22/2023, 11:25 AM  Clinical Narrative:     CSW spoke with pt regarding PT recommendation for SNF. Pt is agreeable to this, medicare choice document provided.  Permission given to send out referral in hub and to talk to daughter [name], son Annette Stable.  Pt lives with son, no current services.  Referral sent out in hub for SNF. [place] Must not working, still needs PASSR.              Expected Discharge Plan: Skilled Nursing Facility Barriers to Discharge: Continued Medical Work up, SNF Pending bed offer   Patient Goals and CMS Choice Patient states their goals for this hospitalization and ongoing recovery are:: return home CMS Medicare.gov Compare Post Acute Care list provided to:: Patient Choice offered to / list presented to : Patient      Expected Discharge Plan and Services In-house Referral: Clinical Social Work   Post Acute Care Choice: Skilled Nursing Facility Living arrangements for the past 2 months: Single Family Home                                      Prior Living Arrangements/Services Living arrangements for the past 2 months: Single Family Home Lives with:: Adult Children (son) Patient language and need for interpreter reviewed:: Yes Do you feel safe going back to the place where you live?: Yes      Need for Family Participation in Patient Care: Yes (Comment) Care giver support system in place?: Yes (comment) Current home services: Other (comment) (none) Criminal Activity/Legal Involvement Pertinent to Current Situation/Hospitalization: No - Comment as needed  Activities of Daily Living Home Assistive Devices/Equipment: Cane (specify quad or straight), Dentures (specify type), Eyeglasses ADL Screening (condition at time of admission) Patient's  cognitive ability adequate to safely complete daily activities?: Yes Is the patient deaf or have difficulty hearing?: Yes Does the patient have difficulty seeing, even when wearing glasses/contacts?: No Does the patient have difficulty concentrating, remembering, or making decisions?: No Patient able to express need for assistance with ADLs?: Yes Does the patient have difficulty dressing or bathing?: Yes Independently performs ADLs?: No Communication: Independent Dressing (OT): Needs assistance Is this a change from baseline?: Change from baseline, expected to last <3days Grooming: Independent, Independent with device (comment) (Independent with set up) Feeding: Independent Bathing: Needs assistance Is this a change from baseline?: Change from baseline, expected to last <3 days Toileting: Needs assistance Is this a change from baseline?: Change from baseline, expected to last <3 days In/Out Bed: Needs assistance Is this a change from baseline?: Change from baseline, expected to last <3 days Walks in Home: Independent with device (comment) (Cane) Does the patient have difficulty walking or climbing stairs?: Yes Weakness of Legs: Both Weakness of Arms/Hands: Both  Permission Sought/Granted Permission sought to share information with : Family Supports Permission granted to share information with : Yes, Verbal Permission Granted  Share Information with NAME: daughter [name], son Annette Stable  Permission granted to share info w AGENCY: SNF        Emotional Assessment Appearance:: Appears stated age Attitude/Demeanor/Rapport: Engaged Affect (typically observed): Appropriate, Pleasant Orientation: : Oriented to Self, Oriented to Place, Oriented to  Time, Oriented to Situation  Admission diagnosis:  Atrial fibrillation with rapid ventricular response (HCC) [I48.91] Acute respiratory failure with hypoxia and hypercapnia (HCC) [J96.01, J96.02] Acute on chronic respiratory failure with hypoxia  and hypercapnia (HCC) [Z61.09, J96.22] Patient Active Problem List   Diagnosis Date Noted   Pleural effusion 01/19/2023   Pleural effusion, left 01/16/2023   Acute respiratory failure with hypoxia and hypercapnia (HCC) 01/13/2023   Aspiration into airway 01/13/2023   Pressure injury of skin 01/13/2023   Acute on chronic respiratory failure with hypoxia and hypercapnia (HCC) 01/04/2023   CAP (community acquired pneumonia) 01/04/2023   Atrial fibrillation with rapid ventricular response (HCC) 01/04/2023   Hypotension 01/04/2023   Cellulitis 01/04/2023   Acute congestive heart failure (HCC) 01/04/2023   SOB (shortness of breath) 06/16/2013   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 [place] ([place]) [place] [address] Phone: [phone] Fax: [phone]  [facility] [address] [place] [address] Phone: [phone] Fax: [phone]     Social Determinants of Health (SDOH) Social History: SDOH Screenings   Food Insecurity: No Food Insecurity (01/11/2023)  Housing: Low Risk  (01/11/2023)  Transportation Needs: No Transportation Needs (01/11/2023)  Utilities: Not At Risk (01/11/2023)  Tobacco Use: Medium Risk (01/04/2023)   SDOH Interventions:     Readmission Risk Interventions     No data to display

## 2023-01-22 NOTE — NC FL2 (Addendum)
[place] MEDICAID FL2 LEVEL OF CARE FORM     IDENTIFICATION  Patient Name: [name] Birthdate: [birth year] Sex: female Admission Date (Current Location): 01/04/2023  [facility] and [place] Number:  Producer, television/film/video and Address:  [facility] [place], [address] [place], [place] [address]      Provider Number: [id number]  Attending Physician Name and Address:  Elease Etienne, MD  Relative Name and Phone Number:  Clarida,Pat Daughter [phone]    Current Level of Care: Hospital Recommended Level of Care: Skilled Nursing Facility Prior Approval Number:    Date Approved/Denied:   PASRR Number:  [id number]  Discharge Plan: SNF    Current Diagnoses: Patient Active Problem List   Diagnosis Date Noted   Pleural effusion 01/19/2023   Pleural effusion, left 01/16/2023   Acute respiratory failure with hypoxia and hypercapnia (HCC) 01/13/2023   Aspiration into airway 01/13/2023   Pressure injury of skin 01/13/2023   Acute on chronic respiratory failure with hypoxia and hypercapnia (HCC) 01/04/2023   CAP (community acquired pneumonia) 01/04/2023   Atrial fibrillation with rapid ventricular response (HCC) 01/04/2023   Hypotension 01/04/2023   Cellulitis 01/04/2023   Acute congestive heart failure (HCC) 01/04/2023   SOB (shortness of breath) 06/16/2013   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   Broken heart syndrome 11/26/2012   DVT (deep venous thrombosis) (HCC) 11/26/2012   Acute pulmonary embolism (HCC) 11/26/2012   DOE (dyspnea on exertion) 11/26/2012   Stress incontinence 11/26/2012    Orientation RESPIRATION BLADDER Height & Weight     Self, Time, Situation, Place  O2 Incontinent, External catheter Weight: 179 lb 0.2 oz (81.2 kg) Height:  5\' 2"  (157.5 cm)  BEHAVIORAL SYMPTOMS/MOOD NEUROLOGICAL BOWEL NUTRITION STATUS      Continent Diet  AMBULATORY STATUS COMMUNICATION OF NEEDS Skin   Limited Assist Verbally Skin abrasions, Other  (Comment) (ecchymosis)                       Personal Care Assistance Level of Assistance  Bathing, Feeding, Dressing Bathing Assistance: Limited assistance Feeding assistance: Limited assistance Dressing Assistance: Limited assistance     Functional Limitations Info  Sight, Hearing, Speech Sight Info: Adequate Hearing Info: Impaired Speech Info: Adequate    SPECIAL CARE FACTORS FREQUENCY  PT (By licensed PT), OT (By licensed OT)     PT Frequency: 5x week OT Frequency: 5x week            Contractures Contractures Info: Not present    Additional Factors Info  Code Status, Allergies Code Status Info: DNR Allergies Info: Codeine, Morphine And Codeine           Current Medications (01/22/2023):  This is the current hospital active medication list Current Facility-Administered Medications  Medication Dose Route Frequency Provider Last Rate Last Admin   0.9 %  sodium chloride infusion  250 mL Intravenous Continuous Ranee Gosselin, MD   Held at 01/14/23 0222   acetaminophen (TYLENOL) tablet 650 mg  650 mg Oral Q6H PRN Paliwal, Eliezer Lofts, MD   650 mg at 01/17/23 1449   acetaZOLAMIDE (DIAMOX) tablet 250 mg  250 mg Oral Q0600 Lorin Glass, MD   250 mg at 01/22/23 1036   allopurinol (ZYLOPRIM) tablet 100 mg  100 mg Oral Daily Olalere, Adewale A, MD   100 mg at 01/22/23 1036   amiodarone (PACERONE) tablet 200 mg  200 mg Oral Daily Coralyn Helling, MD   200 mg at 01/22/23 1035  apixaban (ELIQUIS) tablet 5 mg  5 mg Oral Q12H Knute Neu, RPH   5 mg at 01/22/23 1035   bisacodyl (DULCOLAX) suppository 10 mg  10 mg Rectal Daily PRN Paliwal, Eliezer Lofts, MD       buPROPion (WELLBUTRIN XL) 24 hr tablet 300 mg  300 mg Oral Daily Olalere, Adewale A, MD   300 mg at 01/22/23 1036   calcium carbonate (OS-CAL - dosed in mg of elemental calcium) tablet 1,250 mg  1 tablet Oral BID WC Olalere, Adewale A, MD   1,250 mg at 01/22/23 1610   Chlorhexidine Gluconate Cloth 2 % PADS 6 each  6 each  Topical Daily Leslye Peer, MD   6 each at 01/21/23 2111   cholecalciferol (VITAMIN D3) 25 MCG (1000 UNIT) tablet 1,000 Units  1,000 Units Oral Daily Olalere, Adewale A, MD   1,000 Units at 01/22/23 1035   cyanocobalamin (VITAMIN B12) tablet 1,000 mcg  1,000 mcg Oral Daily Olalere, Adewale A, MD   1,000 mcg at 01/22/23 1036   fluticasone (FLONASE) 50 MCG/ACT nasal spray 2 spray  2 spray Each Nare Daily Leslye Peer, MD   2 spray at 01/19/23 0949   furosemide (LASIX) tablet 40 mg  40 mg Oral Daily Lorin Glass, MD   40 mg at 01/22/23 1035   gabapentin (NEURONTIN) capsule 400 mg  400 mg Oral Daily Olalere, Adewale A, MD   400 mg at 01/22/23 1035   guaiFENesin (MUCINEX) 12 hr tablet 600 mg  600 mg Oral BID Olalere, Adewale A, MD   600 mg at 01/22/23 1036   ipratropium-albuterol (DUONEB) 0.5-2.5 (3) MG/3ML nebulizer solution 3 mL  3 mL Nebulization Q4H PRN Crosley, Debby, MD   3 mL at 01/20/23 1655   lactulose (CHRONULAC) enema 200 gm  300 mL Rectal Once PRN Paliwal, Aditya, MD       metoprolol tartrate (LOPRESSOR) injection 2.5 mg  2.5 mg Intravenous Q3H PRN Josiah Lobo T, MD       midodrine (PROAMATINE) tablet 10 mg  10 mg Oral TID WC Lorin Glass, MD   10 mg at 01/22/23 9604   multivitamin with minerals tablet 1 tablet  1 tablet Oral Daily Leslye Peer, MD   1 tablet at 01/22/23 1035   ondansetron (ZOFRAN) injection 4 mg  4 mg Intravenous Q8H PRN Marrianne Mood, MD   4 mg at 01/19/23 2311   Oral care mouth rinse  15 mL Mouth Rinse PRN Hongalgi, Theadora Rama D, MD       oxyCODONE (Oxy IR/ROXICODONE) immediate release tablet 5 mg  5 mg Oral Q4H PRN Paliwal, Aditya, MD   5 mg at 01/18/23 0325   pneumococcal 20-valent conjugate vaccine (PREVNAR 20) injection 0.5 mL  0.5 mL Intramuscular Tomorrow-1000 Byrum, Les Pou, MD       polyethylene glycol (MIRALAX / GLYCOLAX) packet 17 g  17 g Oral BID Paliwal, Aditya, MD   17 g at 01/19/23 0949   potassium chloride SA (KLOR-CON M) CR tablet 40 mEq   40 mEq Oral Q4H Elease Etienne, MD   40 mEq at 01/22/23 5409   senna-docusate (Senokot-S) tablet 2 tablet  2 tablet Oral BID Marrianne Mood, MD   2 tablet at 01/22/23 1035   sodium chloride (OCEAN) 0.65 % nasal spray 1 spray  1 spray Each Nare PRN Leslye Peer, MD       traMADol Janean Sark) tablet 50 mg  50 mg Oral BID Olalere, Adewale  A, MD   50 mg at 01/21/23 2110     Discharge Medications: Please see discharge summary for a list of discharge medications.  Relevant Imaging Results:  Relevant Lab Results:   Additional Information SSN: [id number]  Lorri Frederick, LCSW

## 2023-01-22 NOTE — Progress Notes (Signed)
Progress Note  Patient Name: [name] Date of Encounter: 01/22/2023 Primary Cardiologist: Sherilyn Cooter [facility] ([name])  Subjective   NAEO She denies symptoms of palpitations, LH    Vital Signs    Vitals:   01/22/23 0500 01/22/23 0750 01/22/23 0800 01/22/23 0840  BP:    (!) 92/59  Pulse:   94 (!) 123  Resp:   18 (!) 27  Temp:  98.4 F (36.9 C)    TempSrc:  Axillary    SpO2:   98% 95%  Weight: 81.2 kg     Height:        Intake/Output Summary (Last 24 hours) at 01/22/2023 0913 Last data filed at 01/22/2023 0600 Gross per 24 hour  Intake 2403 ml  Output 2600 ml  Net -197 ml   Filed Weights   01/20/23 0500 01/21/23 0500 01/22/23 0500  Weight: 86 kg 82.8 kg 81.2 kg    Physical Exam   Vitals:   01/22/23 0800 01/22/23 0840  BP:  (!) 92/59  Pulse: 94 (!) 123  Resp: 18 (!) 27  Temp:    SpO2: 98% 95%    GEN: no acute distress.   Neck: no sig JVD Cardiac: IRIR tachycardia Respiratory: no wob on [place], coarse BS, decreased BS R>L GI: , non-distended  MS: Non-pitting edema  Labs   Telemetry: AF RVR rates up to 120s; fluctuate   Chemistry Recent Labs  Lab 01/16/23 1254 01/17/23 0211 01/18/23 0307 01/19/23 0018 01/20/23 0844 01/22/23 0631  NA  --  135   < > 135 137 137  K  --  4.6   < > 4.1 4.2 2.9*  CL  --  94*   < > 91* 89* 87*  CO2  --  31   < > 32 36* 37*  GLUCOSE  --  109*   < > 107* 132* 109*  BUN  --  10   < > 12 13 18   CREATININE  --  0.96   < > 0.64 0.83 0.86  CALCIUM  --  8.9   < > 9.5 9.9 9.8  PROT 6.2* 5.3*  --   --   --   --   ALBUMIN 2.4* 2.1*  --   --   --   --   AST  --  46*  --   --   --   --   ALT  --  34  --   --   --   --   ALKPHOS  --  117  --   --   --   --   BILITOT  --  0.7  --   --   --   --   GFRNONAA  --  59*   < > >60 >60 >60  ANIONGAP  --  10   < > 12 12 13    < > = values in this interval not displayed.     Hematology Recent Labs  Lab 01/18/23 0307 01/19/23 0018 01/22/23 0631  WBC 9.6 8.9 7.9  RBC  4.62 4.27 4.00  HGB 13.6 12.4 11.5*  HCT 43.9 40.4 37.4  MCV 95.0 94.6 93.5  MCH 29.4 29.0 28.8  MCHC 31.0 30.7 30.7  RDW 15.0 14.9 14.8  PLT 193 189 210    Cardiac Studies   Cardiac Studies & Procedures       ECHOCARDIOGRAM  ECHOCARDIOGRAM COMPLETE 01/05/2023  Narrative ECHOCARDIOGRAM REPORT    Patient Name:   [name] Date of Exam: 01/05/2023 Medical Rec #:  [id number]      Height:       62.0 in Accession #:    [id number]     Weight:       194.7 lb Date of Birth:  1940-11-24      BSA:          1.890 m Patient Age:    82 years       BP:           102/63 mmHg Patient Gender: F              HR:           102 bpm. Exam Location:  Inpatient  Procedure: 2D Echo, Cardiac Doppler and Color Doppler  Indications:    acute systolic chf  History:        Patient has prior history of Echocardiogram examinations, most recent 07/26/2013. History of Takotsubo, Arrythmias:Atrial Fibrillation; Signs/Symptoms:Shortness of Breath.  Sonographer:    Delcie Roch RDCS Referring Phys: [id number] CHING T TU  IMPRESSIONS   1. Left ventricular ejection fraction, by estimation, is 55% with beat to beat variability. The left ventricle has normal function. The left ventricle has no regional wall motion abnormalities. There is mild left ventricular hypertrophy. Left ventricular diastolic parameters are indeterminate. 2. Right ventricular systolic function is mildly reduced. The right ventricular size is moderately enlarged. There is moderately elevated pulmonary artery systolic pressure. The estimated right ventricular systolic pressure is 50.5 mmHg. 3. Left atrial size was severely dilated. 4. Right atrial size was severely dilated. 5. The mitral valve is degenerative. Severe mitral valve regurgitation. No evidence of mitral stenosis. Severe mitral annular calcification. 6. 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 7. The aortic valve is tricuspid. There is mild calcification of the aortic  valve. Aortic valve regurgitation is mild. No aortic stenosis is present. 8. The inferior vena cava is dilated in size with <50% respiratory variability, suggesting right atrial pressure of 15 mmHg.  FINDINGS Left Ventricle: Left ventricular ejection fraction, by estimation, is 55%. The left ventricle has normal function. The left ventricle has no regional wall motion abnormalities. The left ventricular internal cavity size was normal in size. There is mild left ventricular hypertrophy. Left ventricular diastolic parameters are indeterminate.  Right Ventricle: The right ventricular size is moderately enlarged. No increase in right ventricular wall thickness. Right ventricular systolic function is mildly reduced. There is moderately elevated pulmonary artery systolic pressure. The tricuspid regurgitant velocity is 2.98 m/s, and with an assumed right atrial pressure of 15 mmHg, the estimated right ventricular systolic pressure is 50.5 mmHg.  Left Atrium: Left atrial size was severely dilated.  Right Atrium: Right atrial size was severely dilated.  Pericardium: There is no evidence of pericardial effusion.  Mitral Valve: The mitral valve is degenerative in appearance. Severe mitral annular calcification. Severe mitral valve regurgitation. No evidence of mitral valve stenosis. The mean mitral valve gradient is 3.0 mmHg with average heart rate of 101 bpm.  Tricuspid Valve: The tricuspid valve is normal in structure. 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 No evidence of tricuspid stenosis.  Aortic Valve: The aortic valve is tricuspid. There is mild calcification of the aortic valve. Aortic valve regurgitation is mild. Aortic regurgitation PHT measures 451 msec. No aortic stenosis is present.  Pulmonic Valve: The pulmonic valve was normal in structure. Pulmonic valve regurgitation is trivial. No evidence of pulmonic stenosis.  Aorta: The aortic root is normal in size and structure.  Venous: The  inferior vena cava is dilated in size with less than 50% respiratory variability, suggesting right atrial pressure of 15 mmHg. The inferior vena cava and the hepatic vein show a pattern of systolic flow reversal, suggestive of tricuspid regurgitation.  IAS/Shunts: No atrial level shunt detected by color flow Doppler.  Additional Comments: There is pleural effusion in the right lateral region.   LEFT VENTRICLE PLAX 2D LVIDd:         4.80 cm LVIDs:         3.80 cm LV PW:         1.10 cm LV IVS:        1.20 cm LVOT diam:     2.10 cm LVOT Area:     3.46 cm   RIGHT VENTRICLE             IVC RV Basal diam:  3.60 cm     IVC diam: 2.60 cm RV S prime:     10.90 cm/s TAPSE (M-mode): 1.1 cm  LEFT ATRIUM              Index        RIGHT ATRIUM           Index LA diam:        5.10 cm  2.70 cm/m   RA Area:     29.70 cm LA Vol (A2C):   126.0 ml 66.67 ml/m  RA Volume:   104.00 ml 55.03 ml/m LA Vol (A4C):   94.0 ml  49.74 ml/m LA Biplane Vol: 109.0 ml 57.68 ml/m AORTIC VALVE AI PHT:      451 msec  AORTA Ao Root diam: 2.80 cm Ao Asc diam:  3.60 cm  MITRAL VALVE           TRICUSPID VALVE MV Mean grad: 3.0 mmHg TR Peak grad:   35.5 mmHg TR Vmax:        298.00 cm/s  SHUNTS Systemic Diam: 2.10 cm  Weston Brass MD Electronically signed by Weston Brass MD Signature Date/Time: 01/05/2023/8:37:43 PM    Final                  Assessment & Plan   Acute on chronic respiratory failure with hypoxia and hypercapnia Acute on chronic HFpEF with moderate right pleural effusion - multifocal with CAP, volume, and aspiration PNA with R base collapse and consolidation - low BP, now s/p thoracentesis and chest tube - agree with oral diuretics ; looks intravascularly euvolemic  Persistent atrial fibrillation Hypotension requiring midodrine and/or pressors - she is asymptomatic ; hopefully as her PNA improves, her rates will go down. 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 Don't hesitate to reach out for questions   For questions or updates, please contact [facility] Please consult [email or web address] for contact info under Cardiology/STEMI.   Maisie Fus, MD

## 2023-01-22 NOTE — Progress Notes (Signed)
01/22/2023   I have seen and evaluated the patient for hypoxemia.   S:  O2 needs much improved today. In good spirits. Eating, up in chair during day.   O: Blood pressure 96/66, pulse 64, temperature 97.6 F (36.4 C), temperature source Oral, resp. rate 17, height 5\' 3"  (1.6 m), weight 81.6 kg, SpO2 94 %.    No distress Extensive bruising Global weakness stable Breath sounds diminished bases Sats mid 90s 6LPM  BMP pending More sluggish urine response today   A:  Acute on chronic hypoxemic respiratory failure improving after diuresis, antibiotics Acute on chronic heart failure improving Parapneumonic effusion resolved Severe muscular deconditioning Frail elderly Afib Hx PE Left leg cellulitis improved Hx gout, depression, constipation, neuropathy  P:  - f/u BMP, diuresis as tolerated - Continue midodrine - PT/OT, ? If eligible for CIR - Appreciate PMT input - Given marked improvement will be available PRN   Myrla Halsted MD [place] Prefer epic messenger for cross cover needs If after hours, please call E-link

## 2023-01-22 NOTE — Progress Notes (Signed)
PROGRESS NOTE   [name]  ZOX:[id number]    DOB: 1941-04-04    DOA: 01/04/2023  PCP: Herma Carson, MD   I have briefly reviewed patients previous medical records in [facility].  Chief Complaint  Patient presents with   Irregular Heart Beat    Brief Narrative:  82 year old female, PMH of A-fib, DVT/PE, Takotsubo cardiomyopathy, V-fib, chronic systolic CHF, CVA with residual deficits, presented 01/04/2023 with complaints of weakness, dyspnea, lower extremity edema and encephalopathy.  Admitted for acute on chronic hypoxic and hypercapnic respiratory failure, RLL CAP complicated by parapneumonic effusion requiring chest tube, acute on chronic combined CHF, A-fib with RVR and left leg cellulitis.  She had been transferred to ICU under PCCM care.   Chest x-ray 01/09/2023-persistent atelectasis right lung Ultrasound chest 01/08/2023-right effusion, consolidation CT scan of the chest 01/09/2023-complete atelectasis right lung, consolidation left base Thoracentesis 8/1, 1.1 L removed, stopped due to chest discomfort Intubation for bronchoscopy 8/2 with right-sided secretions removed.  Extubated postprocedure 8/5 recurrent total right atelectasis, increased respiratory needs >> BiPAP 8/7 off pressors 8/8 Rt pig tail inserted Code status: DNR Palliative consulted and following 8/14 Transferred back to [facility]  Subjective:   Assessment & Plan:   Acute on chronic hypoxic, hypercapnic respiratory failure. CAP RLL complicated by parapneumonic effusion Acute on chronic combined CHF. Initially RX w/ Diuretics, Foll by bronchoscopy for pulm toilet for white out R lung, then extubated after bronch 8/21foll by intermittent BIPAP -foll by right-sided chest tube, removed 8/13 -Needing BiPAP at night and [place] oxygen during daytime.  Wean O2 down as tolerated. Aggressive pulmonary toilet including flutter valve, incentive spirometry and mobilize. Completed course of antibiotics. Recommend repeating  chest x-ray in 4 weeks. See CHF discussion below. Palliative care input 8/13 appreciated.  DNR DNI confirmed.  Ongoing conversations regarding GOC.  Acute on chronic combined CHF: Improving on IV Lasix. -6.8 L thus far. Appears mildly volume overloaded.  Continue IV Lasix.  Creatinine normal/stable. Consider transitioning to oral Lasix in the next day or 2.  Hypokalemia: Potassium 2.9, due to aggressive IV diuresis. Magnesium 2.1. Replace p.o. potassium aggressively and follow BMP in AM.  Atrial fibrillation with RVR. Continue amiodarone 200 Mg daily. Remains with mild RVR in the 100s-110s. Unable to add beta-blockers or calcium channel blockers, limited by hypotension. Consulted cardiology again 8/14.?  Increase amiodarone, versus add digoxin.  Per cardiology note from 8/11, did not respond to digoxin.  Cardiology signed off 8/12. Continue apixaban.  Hypotension: SBP consistently in the 80s-90s. Continue midodrine 10 Mg 3 times daily. Asymptomatic.  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  Outpatient follow-up with cardiology.  HLD. Currently not on meds.  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  Lt leg cellulitis Currently wrapped. Per prior RN report, looked good. Completed course of antibiotics.  Hx gout, depression, constipation, peripheral neuropathy continue PTA wellbutrin, allopurinol, neurontin, tramadol  Body mass index is 32.74 kg/m. Obesity/complicates care.  Outpatient follow-up.  Nutritional Status Nutrition Problem: Increased nutrient needs Etiology: wound healing Signs/Symptoms: estimated needs Interventions: Boost Breeze, MVI, Liberalize Diet  Pressure Ulcer: Pressure Injury 01/04/23 Pretibial Left Stage 2 -  Partial thickness loss of dermis presenting as a shallow open injury with a red, pink wound bed without slough. (Active)  01/04/23 2200  Location: Pretibial  Location Orientation: Left  Staging: Stage 2 -  Partial thickness loss of  dermis presenting as a shallow open injury with a red, pink wound bed without slough.  Wound Description (Comments):  Present on Admission: Yes  Dressing Type Silver dressings;Gauze (Comment);ABD;Compression wrap 01/22/23 1000    DVT prophylaxis:   Full dose apixaban   Code Status: DNR:  Family Communication: None at bedside Disposition: TBD, remains hypoxic     Consultants:   PCCM Cardiology Palliative care medicine  Procedures:   Chest x-ray 01/09/2023-persistent atelectasis right lung Ultrasound chest 01/08/2023-right effusion, consolidation CT scan of the chest 01/09/2023-complete atelectasis right lung, consolidation left base Thoracentesis 8/1, 1.1 L removed, stopped due to chest discomfort Intubation for bronchoscopy 8/2 with right-sided secretions removed.  Extubated postprocedure 8/5 recurrent total right atelectasis, increased respiratory needs >> BiPAP 8/7 off pressors 8/8 Rt pig tail inserted 8/13 right pigtail chest tube removed. Ongoing nightly BiPAP and [place] oxygen during the daytime.  Antimicrobials:   Completed course  Objective:   Vitals:   01/22/23 1129 01/22/23 1250 01/22/23 1517 01/22/23 1600  BP:  95/64  99/74  Pulse:  (!) 119  (!) 124  Resp:  (!) 22  (!) 29  Temp: 99.3 F (37.4 C)  99.5 F (37.5 C)   TempSrc: Oral  Oral   SpO2:  93%  96%  Weight:      Height:           Data Reviewed:   I have personally reviewed following labs and imaging studies   CBC: Recent Labs  Lab 01/17/23 0211 01/18/23 0307 01/19/23 0018 01/22/23 0631  WBC 8.5 9.6 8.9 7.9  NEUTROABS 6.7  --   --   --   HGB 11.9* 13.6 12.4 11.5*  HCT 37.2 43.9 40.4 37.4  MCV 93.2 95.0 94.6 93.5  PLT 169 193 189 210    Basic Metabolic Panel: Recent Labs  Lab 01/16/23 0203 01/16/23 0205 01/16/23 0205 01/17/23 0211 01/18/23 0307 01/19/23 0018 01/20/23 0844 01/22/23 0631  NA  --  134*   < > 135 134* 135 137 137  K  --  3.2*   < > 4.6 4.4 4.1 4.2 2.9*  CL  --  91*    < > 94* 88* 91* 89* 87*  CO2  --  32   < > 31 34* 32 36* 37*  GLUCOSE  --  94   < > 109* 104* 107* 132* 109*  BUN  --  7*   < > 10 10 12 13 18   CREATININE  --  0.65   < > 0.96 0.69 0.64 0.83 0.86  CALCIUM  --  8.8*   < > 8.9 9.6 9.5 9.9 9.8  MG  --  1.8  --  2.1  --   --  2.1 2.1  PHOS 3.0  --   --   --   --   --  4.0  --    < > = values in this interval not displayed.    Liver Function Tests: Recent Labs  Lab 01/16/23 1254 01/17/23 0211  AST  --  46*  ALT  --  34  ALKPHOS  --  117  BILITOT  --  0.7  PROT 6.2* 5.3*  ALBUMIN 2.4* 2.1*    CBG: Recent Labs  Lab 01/16/23 0353 01/16/23 1936 01/17/23 0334  GLUCAP 106* 114* 93    Microbiology Studies:   Recent Results (from the past 240 hour(s))  Body fluid culture w Gram Stain     Status: None   Collection Time: 01/16/23 10:30 AM   Specimen: Pleural Fluid  Result Value Ref Range Status   Specimen Description PLEURAL  Final   Special Requests left  Final   Gram Stain   Final    FEW WBC PRESENT, PREDOMINANTLY MONONUCLEAR NO ORGANISMS SEEN    Culture   Final    NO GROWTH 3 DAYS Performed at [facility], [address] [place], [place] [address]    Report Status 01/19/2023 FINAL  Final  Body fluid culture w Gram Stain     Status: None   Collection Time: 01/16/23 12:11 PM   Specimen: Peritoneal Washings; Body Fluid  Result Value Ref Range Status   Specimen Description PERITONEAL  Final   Special Requests right  Final   Gram Stain   Final    FEW WBC PRESENT,BOTH PMN AND MONONUCLEAR NO ORGANISMS SEEN    Culture   Final    NO GROWTH 3 DAYS Performed at [facility], [address], [place], [place] [address]    Report Status 01/19/2023 FINAL  Final    Radiology Studies:  DG Chest Port 1 View  Result Date: 01/21/2023 CLINICAL DATA:  Pneumothorax EXAM: PORTABLE CHEST 1 VIEW COMPARISON:  01/20/2023 FINDINGS: Interval repositioning of right-sided pigtail chest tube, tip remains about the right lung  base. Complete consolidation of the right lower lobe and elevation of the right hemidiaphragm. No visible pneumothorax. Small layering left pleural effusion. Cardiomegaly. Osseous structures unremarkable. IMPRESSION: 1. Interval repositioning of right-sided pigtail chest tube, tip remains about the right lung base. No visible pneumothorax. 2. Complete consolidation of the right lower lobe and elevation of the right hemidiaphragm. 3. Small layering left pleural effusion. 4. Cardiomegaly. Electronically Signed   By: Jearld Lesch M.D.   On: 01/21/2023 09:51    Scheduled Meds:    acetaZOLAMIDE  250 mg Oral Q0600   allopurinol  100 mg Oral Daily   amiodarone  200 mg Oral Daily   apixaban  5 mg Oral Q12H   buPROPion  300 mg Oral Daily   calcium carbonate  1 tablet Oral BID WC   Chlorhexidine Gluconate Cloth  6 each Topical Daily   cholecalciferol  1,000 Units Oral Daily   cyanocobalamin  1,000 mcg Oral Daily   fluticasone  2 spray Each Nare Daily   furosemide  40 mg Oral Daily   gabapentin  400 mg Oral Daily   guaiFENesin  600 mg Oral BID   midodrine  10 mg Oral TID WC   multivitamin with minerals  1 tablet Oral Daily   pantoprazole  40 mg Oral Daily   pneumococcal 20-valent conjugate vaccine  0.5 mL Intramuscular Tomorrow-1000   polyethylene glycol  17 g Oral BID   senna-docusate  2 tablet Oral BID   traMADol  50 mg Oral BID    Continuous Infusions:    sodium chloride Stopped (01/14/23 0222)     LOS: 18 days     Zannie Cove, MD,   [facility]   01/22/2023, 7:09 PM

## 2023-01-22 NOTE — Progress Notes (Signed)
Physical Therapy Treatment Patient Details Name: [name] MRN: [id number] DOB: 16-Oct-1940 Today's Date: 01/22/2023   History of Present Illness 82 y.o. female admitted 7/27 with weakness, SOB, AMS. Pt with Rt pleural effusion, s/p thoracentesis 8/1. 8/2 intubated, bronch and extubated. 8/4 Afib with RVR. 8/8 thoracentesis and chest tube placement. 8/13 chest tube removed PMH: Takotsubo cardiomyopathy, CVA, DVT/PE, Vfib, chronic hypoxemic respiratory failure on 2L PRN , L LE non-healing wound/cellulitis    PT Comments  Pt greeted up in chair on arrival with slow but steady progress towards acute goals. Pt able to progress gait, ambulating 24' this session with RW support and min A for safety/line/O2 tank management with cues for upright posture and forward gaze with pt able to correct but unable to maintain. Gait distance limited to pt stated tolerance due to LE fatigue and weakness, with no overt LOB noted. Pt able to perform seated LE therex with cues for technique and encouragement for completion. Current plan remains appropriate to address deficits and maximize functional independence and decrease caregiver burden. Pt continues to benefit from skilled PT services to progress toward functional mobility goals.     If plan is discharge home, recommend the following: A little help with walking and/or transfers;A little help with bathing/dressing/bathroom;Assist for transportation;Help with stairs or ramp for entrance;A lot of help with bathing/dressing/bathroom;Assistance with cooking/housework   Can travel by private vehicle     Yes  Equipment Recommendations  BSC/3in1    Recommendations for Other Services       Precautions / Restrictions Precautions Precautions: Fall Precaution Comments: watch SpO2, HFNC Restrictions Weight Bearing Restrictions: No     Mobility  Bed Mobility Overal bed mobility: Needs Assistance             General bed mobility comments: pt up in chair  on arrival and at end fo session    Transfers Overall transfer level: Needs assistance Equipment used: Rolling walker (2 wheels) Transfers: Sit to/from Stand Sit to Stand: Contact guard assist           General transfer comment: good recall for hand placement    Ambulation/Gait Ambulation/Gait assistance: Min assist Gait Distance (Feet): 24 Feet Assistive device: Rolling walker (2 wheels) Gait Pattern/deviations: Step-through pattern, Trunk flexed, Wide base of support Gait velocity: decr     General Gait Details: short bout of gait in room, distance limited to pt stated LE fatigue/weakness and tolerance this date and requesting to return to chair, pt stating this is about half the distance she was able to amublate PTA, no LOB, max cues for upright posture and forward gaze with pt able to correct but unable to maintain   Stairs             Wheelchair Mobility     Tilt Bed    Modified Rankin (Stroke Patients Only)       Balance Overall balance assessment: Needs assistance Sitting-balance support: No upper extremity supported, Feet supported Sitting balance-Leahy Scale: Fair     Standing balance support: Bilateral upper extremity supported, During functional activity, Reliant on assistive device for balance Standing balance-Leahy Scale: Poor Standing balance comment: dependent on RW                            Cognition Arousal: Alert Behavior During Therapy: WFL for tasks assessed/performed Overall Cognitive Status: Within Functional Limits for tasks assessed  General Comments: Pt hard of hearing, so intermittently needing repeated commands, but overall pleasant, conversational, oriented.        Exercises General Exercises - Lower Extremity Long Arc Quad: AROM, Both, Seated, 10 reps Hip Flexion/Marching: AROM, Both, Seated, 20 reps    General Comments General comments (skin integrity, edema,  etc.): HFNC 6L SpO2 >92% throughout activity, HR up to 132bpm with activity, 111 at rest on arrival      Pertinent Vitals/Pain Pain Assessment Pain Assessment: No/denies pain    Home Living                          Prior Function            PT Goals (current goals can now be found in the care plan section) Acute Rehab PT Goals Patient Stated Goal: get better PT Goal Formulation: With patient Time For Goal Achievement: 01/25/23 Progress towards PT goals: Progressing toward goals    Frequency    Min 1X/week      PT Plan      Co-evaluation              AM-PAC PT "6 Clicks" Mobility   Outcome Measure  Help needed turning from your back to your side while in a flat bed without using bedrails?: A Little Help needed moving from lying on your back to sitting on the side of a flat bed without using bedrails?: A Little Help needed moving to and from a bed to a chair (including a wheelchair)?: A Little Help needed standing up from a chair using your arms (e.g., wheelchair or bedside chair)?: A Little Help needed to walk in hospital room?: A Little Help needed climbing 3-5 steps with a railing? : Total 6 Click Score: 16    End of Session Equipment Utilized During Treatment: Oxygen;Gait belt Activity Tolerance: Patient tolerated treatment well;Patient limited by fatigue Patient left: in chair;with call bell/phone within reach Nurse Communication: Mobility status PT Visit Diagnosis: Muscle weakness (generalized) (M62.81);Difficulty in walking, not elsewhere classified (R26.2);Other abnormalities of gait and mobility (R26.89)     Time: 5784-6962 PT Time Calculation (min) (ACUTE ONLY): 20 min  Charges:    $Gait Training: 8-22 mins PT General Charges $$ ACUTE PT VISIT: 1 Visit                     Verna Desrocher R. PTA Acute Rehabilitation Services Office: [phone]   Catalina Antigua 01/22/2023, 2:00 PM

## 2023-01-23 LAB — BASIC METABOLIC PANEL
Anion gap: 12 (ref 5–15)
BUN: 18 mg/dL (ref 8–23)
CO2: 32 mmol/L (ref 22–32)
Calcium: 10.1 mg/dL (ref 8.9–10.3)
Chloride: 90 mmol/L — ABNORMAL LOW (ref 98–111)
Creatinine, Ser: 0.76 mg/dL (ref 0.44–1.00)
GFR, Estimated: >60 mL/min (ref >60)
Glucose, Bld: 105 mg/dL — ABNORMAL HIGH (ref 70–99)
Potassium: 3.9 mmol/L (ref 3.5–5.1)
Sodium: 134 mmol/L — ABNORMAL LOW (ref 135–145)

## 2023-01-23 MED ORDER — TORSEMIDE 20 MG PO TABS
20.0000 mg | ORAL_TABLET | Freq: Every day | ORAL | Status: DC
  Administered 2023-01-23 – 2023-01-30 (×8): 20 mg via ORAL
  Filled 2023-01-23 (×8): qty 1

## 2023-01-23 MED ORDER — POTASSIUM CHLORIDE CRYS ER 20 MEQ PO TBCR
40.0000 meq | EXTENDED_RELEASE_TABLET | Freq: Every day | ORAL | Status: DC
  Administered 2023-01-23 – 2023-01-24 (×2): 40 meq via ORAL
  Filled 2023-01-23 (×2): qty 2

## 2023-01-23 MED ORDER — AMIODARONE HCL 200 MG PO TABS
200.0000 mg | ORAL_TABLET | Freq: Two times a day (BID) | ORAL | Status: DC
  Administered 2023-01-23 – 2023-01-30 (×14): 200 mg via ORAL
  Filled 2023-01-23 (×14): qty 1

## 2023-01-23 MED ORDER — DIPHENHYDRAMINE-ZINC ACETATE 2-0.1 % EX CREA: TOPICAL_CREAM | Freq: Every day | CUTANEOUS | Status: DC | PRN

## 2023-01-23 NOTE — Progress Notes (Signed)
pt complained about a spot on the side of her right leg that is tender there area is not open it is a raised area she says its itches. MD made aware. See new orders

## 2023-01-23 NOTE — Progress Notes (Signed)
   01/23/23 1717  Vitals  Temp (!) 97.4 F (36.3 C)  Temp Source Oral  BP 122/82  MAP (mmHg) 93  BP Location Left Arm  BP Method Automatic  Patient Position (if appropriate) Lying  ECG Heart Rate (!) 111  Resp (!) 22  MEWS COLOR  MEWS Score Color Yellow  Oxygen Therapy  SpO2 94 %  O2 Device Nasal Cannula  O2 Flow Rate (L/min) 3.5 L/min  ECG Monitoring  QRS interval 0.09  CV Strip Heart Rate 110  Cardiac Rhythm Atrial fibrillation  MEWS Score  MEWS Temp 0  MEWS Systolic 0  MEWS Pulse 2  MEWS RR 1  MEWS LOC 0  MEWS Score 3   Patient arrive from 59M to 4E 10, patient placed on monitor and CCMD made aware, Paitient vital signs obtained. 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 Skin above wrap is dry and flaky. Skin to bottom is reddened in areas appears due to moister.  Laurieanne Galloway, Randall An RN

## 2023-01-23 NOTE — Progress Notes (Addendum)
Pt transfer from 39M. Confirmed pt with existing PASRR: [id number]. Will f/u SNF with bed offers and provide updates as available.   Dellie Burns, MSW, LCSW [phone] (coverage)

## 2023-01-23 NOTE — Progress Notes (Signed)
Occupational Therapy Treatment Patient Details Name: [name] MRN: [id number] DOB: August 25, 1940 Today's Date: 01/23/2023   History of present illness 82 y.o. female admitted 7/27 with weakness, SOB, AMS. Pt with Rt pleural effusion, s/p thoracentesis 8/1. 8/2 intubated, bronch and extubated. 8/4 Afib with RVR. 8/8 thoracentesis and chest tube placement. 8/13 chest tube removed PMH: Takotsubo cardiomyopathy, CVA, DVT/PE, Vfib, chronic hypoxemic respiratory failure on 2L PRN , L LE non-healing wound/cellulitis   OT comments  Pt progressing toward established OT goals. Able to tolerate standing activity ~3 minutes this session with ~84ft mobility with RW and min guard A and rinsing dentures with min A for balance; more constant assist with onset of fatigue. Pt with good awareness and reporting fatigue to therapist. At start of session when asked to perform oral care, pt with poor insight into goal of activity; stating she already knew how to brush teeth; afterwards discussing activity tolerance and balance while standing with 1UE or no UE support, pt with greater understanding. Patient will benefit from continued inpatient follow up therapy, <3 hours/day       If plan is discharge home, recommend the following:  A lot of help with walking and/or transfers;A lot of help with bathing/dressing/bathroom;Assistance with cooking/housework;Help with stairs or ramp for entrance;Assist for transportation   Equipment Recommendations  Other (comment) (defer)    Recommendations for Other Services      Precautions / Restrictions Precautions Precautions: Fall Precaution Comments: watch SpO2, HFNC Restrictions Weight Bearing Restrictions: No       Mobility Bed Mobility               General bed mobility comments: up in chiar on arrival and departure    Transfers Overall transfer level: Needs assistance Equipment used: Rolling walker (2 wheels) Transfers: Sit to/from Stand Sit to Stand:  Contact guard assist           General transfer comment: good recall for hand placement     Balance Overall balance assessment: Needs assistance Sitting-balance support: No upper extremity supported, Feet supported Sitting balance-Leahy Scale: Fair     Standing balance support: Bilateral upper extremity supported, During functional activity, Reliant on assistive device for balance, Single extremity supported Standing balance-Leahy Scale: Poor Standing balance comment: dependent on RW; external support if only one UE for support (during grooming)                           ADL either performed or assessed with clinical judgement   ADL Overall ADL's : Needs assistance/impaired Eating/Feeding: Modified independent;Sitting Eating/Feeding Details (indicate cue type and reason): at end of session, self feeding in recliner Grooming: Minimal assistance;Standing;Oral care Grooming Details (indicate cue type and reason): min A for balance intermittently; more constant with fatigue.                 Toilet Transfer: Contact guard assist;Ambulation;Rolling walker (2 wheels) Toilet Transfer Details (indicate cue type and reason): simulated in room ~9 ft         Functional mobility during ADLs: Contact guard assist;Rolling walker (2 wheels) General ADL Comments: Pt ambulatory ~7 feet then turning around to walk to sink. After rinsing dentures at sink, beginning to feel fatigued. SpO2 >90 with good PLETH, however, during grooming SpO2 as low as 80 but poor pleth    Extremity/Trunk Assessment Upper Extremity Assessment Upper Extremity Assessment: Generalized weakness   Lower Extremity Assessment Lower Extremity Assessment: Defer to PT evaluation  Vision   Vision Assessment?: No apparent visual deficits   Perception Perception Perception: Within Functional Limits   Praxis Praxis Praxis: WFL    Cognition Arousal: Alert Behavior During Therapy: WFL for  tasks assessed/performed Overall Cognitive Status: Within Functional Limits for tasks assessed                                 General Comments: Pt hard of hearing, so intermittently needing repeated commands, but overall pleasant, conversational, oriented. Needing 1-2 cues for use of RW this session with turns        Exercises      Shoulder Instructions       General Comments HFNC 5L    Pertinent Vitals/ Pain       Pain Assessment Pain Assessment: Faces Faces Pain Scale: Hurts a little bit Pain Location: Posterior side of LLE Pain Descriptors / Indicators: Tender Pain Intervention(s): Limited activity within patient's tolerance, Monitored during session, Other (comment) (RN informed as pt reports this spot looks similar to what wound on foot initially looked like.)  Home Living                                          Prior Functioning/Environment              Frequency  Min 1X/week        Progress Toward Goals  OT Goals(current goals can now be found in the care plan section)  Progress towards OT goals: Progressing toward goals  Acute Rehab OT Goals Patient Stated Goal: get better OT Goal Formulation: With patient Time For Goal Achievement: 01/31/23 Potential to Achieve Goals: Good ADL Goals Pt Will Perform Grooming: with supervision;standing Pt Will Perform Upper Body Dressing: with modified independence;sitting Pt Will Perform Lower Body Dressing: with supervision;sit to/from stand Pt Will Transfer to Toilet: with supervision;ambulating;regular height toilet Additional ADL Goal #1: Pt will demonstrate increased activity tolerance as demonstrated by performance of ADL in standing >5 minutes without need for rest breaks.  Plan      Co-evaluation                 AM-PAC OT "6 Clicks" Daily Activity     Outcome Measure   Help from another person eating meals?: A Little Help from another person taking care of  personal grooming?: A Little Help from another person toileting, which includes using toliet, bedpan, or urinal?: A Lot Help from another person bathing (including washing, rinsing, drying)?: A Lot Help from another person to put on and taking off regular upper body clothing?: A Little Help from another person to put on and taking off regular lower body clothing?: A Lot 6 Click Score: 15    End of Session Equipment Utilized During Treatment: Gait belt;Rolling walker (2 wheels)  OT Visit Diagnosis: Unsteadiness on feet (R26.81);Muscle weakness (generalized) (M62.81)   Activity Tolerance Patient tolerated treatment well   Patient Left in chair;with call bell/phone within reach;with chair alarm set   Nurse Communication Mobility status (spot on back of LLE)        Time: 1610-9604 OT Time Calculation (min): 18 min  Charges: OT General Charges $OT Visit: 1 Visit OT Treatments $Self Care/Home Management : 8-22 mins  Tyler Deis, OTR/L [facility] Acute Rehabilitation Office: [phone]   Myrla Halsted 01/23/2023,  1:00 PM

## 2023-01-23 NOTE — Progress Notes (Addendum)
Nutrition Follow-up  DOCUMENTATION CODES:  Not applicable  INTERVENTION:  Continue current diet as ordered Ordering assistance Multivitamin w/ minerals daily Magic cup TID with meals, each supplement provides 290 kcal and 9 grams of protein  NUTRITION DIAGNOSIS:  Increased nutrient needs related to wound healing as evidenced by estimated needs.  - Ongoing  GOAL:  Patient will meet greater than or equal to 90% of their needs  - Ongoing  MONITOR:  PO intake, Supplement acceptance, I & O's, Labs  REASON FOR ASSESSMENT:   (Pressure Injury)    ASSESSMENT:  Pt with hx of CHF, previous stroke, PAF, and DVT presented to ED with SOB worsening over the last week. Found to have CHF exacerbation. Of note, pt with wounds and edema to the BLE that are cared for by home health.  8/01 - right thoracentesis, 1.1L of bloody fluid drained 8/02 - intubated, bronchoscopy, extubated 8/05 - respiratory decline, pt made NPO and requiring BiPAP 8/08 - diet advanced to heart healthy; Thoracentesis (500 mL removed); R chest tube placed 8/13 - chest tube removed  Pt resting in bedside chair at the time of assessment. States that she is feeling ok today and that she is trying to eat a little more. Average intake of meals ~75-80% which is acceptable. Still eating magic cups.  No complaints today. Has a productive cough.   Intake/Output Summary (Last 24 hours) at 01/23/2023 1134 Last data filed at 01/23/2023 0900 Gross per 24 hour  Intake 480 ml  Output 950 ml  Net -470 ml  Net IO Since Admission: -7,494.97 mL [01/23/23 1134]  Average Meal Intake: 8/3-8/14: 76% intake x 8 recorded meals  Nutritionally Relevant Medications: Scheduled Meds:  calcium carbonate  1 tablet Oral BID WC   cholecalciferol  1,000 Units Oral Daily   cyanocobalamin  1,000 mcg Oral Daily   multivitamin with minerals  1 tablet Oral Daily   pantoprazole  40 mg Oral Daily   polyethylene glycol  17 g Oral BID    senna-docusate  2 tablet Oral BID   PRN Meds: bisacodyl, lactulose, ondansetron  Labs Reviewed: Na 134, chloride 90 CBG ranges from 77-144 mg/dL over the last 24 hours  Diet Order:   Diet Order             Diet regular Room service appropriate? Yes with Assist; Fluid consistency: Thin  Diet effective now                   EDUCATION NEEDS:  Education needs have been addressed  Skin:  Skin Assessment: Reviewed RN Assessment Skin tear:  - left buttocks (2 x 6 cm) Stage 2: - left tibial area (1 x 0.5 cm)  Last BM:  8/13 - type 4  Height:  Ht Readings from Last 1 Encounters:  01/04/23 5\' 2"  (1.575 m)    Weight:  Wt Readings from Last 1 Encounters:  01/23/23 81.5 kg    Ideal Body Weight:  50 kg  BMI:  Body mass index is 32.86 kg/m.  Estimated Nutritional Needs:  Kcal:  1500-1700 kcal/d Protein:  75-90g/d Fluid:  1.5-1.7L/d    Greig Castilla, RD, LDN Clinical Dietitian RD pager # available in [facility]  After hours/weekend pager # available in [facility]

## 2023-01-24 ENCOUNTER — Encounter (HOSPITAL_COMMUNITY): Payer: Self-pay | Admitting: Family Medicine

## 2023-01-24 DIAGNOSIS — J9621 Acute and chronic respiratory failure with hypoxia: Secondary | ICD-10-CM | POA: Diagnosis not present

## 2023-01-24 DIAGNOSIS — J9622 Acute and chronic respiratory failure with hypercapnia: Secondary | ICD-10-CM | POA: Diagnosis not present

## 2023-01-24 LAB — CBC
HCT: 37.1 % (ref 36.0–46.0)
Hemoglobin: 11.3 g/dL — ABNORMAL LOW (ref 12.0–15.0)
MCH: 28.7 pg (ref 26.0–34.0)
MCHC: 30.5 g/dL (ref 30.0–36.0)
MCV: 94.2 fL (ref 80.0–100.0)
Platelets: 252 10*3/uL (ref 150–400)
RBC: 3.94 MIL/uL (ref 3.87–5.11)
RDW: 15 % (ref 11.5–15.5)
WBC: 6.5 10*3/uL (ref 4.0–10.5)
nRBC: 0 % (ref 0.0–0.2)

## 2023-01-24 LAB — BASIC METABOLIC PANEL
Anion gap: 11 (ref 5–15)
BUN: 21 mg/dL (ref 8–23)
CO2: 30 mmol/L (ref 22–32)
Calcium: 10.1 mg/dL (ref 8.9–10.3)
Chloride: 93 mmol/L — ABNORMAL LOW (ref 98–111)
Creatinine, Ser: 1.02 mg/dL — ABNORMAL HIGH (ref 0.44–1.00)
GFR, Estimated: 55 mL/min — ABNORMAL LOW (ref >60)
Glucose, Bld: 125 mg/dL — ABNORMAL HIGH (ref 70–99)
Potassium: 4.7 mmol/L (ref 3.5–5.1)
Sodium: 134 mmol/L — ABNORMAL LOW (ref 135–145)

## 2023-01-24 MED ORDER — METOPROLOL SUCCINATE ER 25 MG PO TB24
25.0000 mg | ORAL_TABLET | Freq: Every day | ORAL | Status: DC
  Administered 2023-01-24 – 2023-01-26 (×3): 25 mg via ORAL
  Filled 2023-01-24 (×3): qty 1

## 2023-01-24 MED ORDER — POTASSIUM CHLORIDE CRYS ER 20 MEQ PO TBCR: 20.0000 meq | EXTENDED_RELEASE_TABLET | Freq: Every day | ORAL | Status: DC

## 2023-01-24 MED ORDER — METOPROLOL SUCCINATE ER 25 MG PO TB24: 12.5000 mg | ORAL_TABLET | Freq: Every day | ORAL | Status: DC

## 2023-01-24 NOTE — Progress Notes (Signed)
Heart Failure Nurse Navigator Progress Note  PCP: Herma Carson, MD PCP-Cardiologist: None Admission Diagnosis: Acute respiratory failure with hypoxia and hypercapnia, Atrial fibrillation with RVR.  Admitted from: Home via EMS  Presentation:   [name] presented with weakness, shortness of breath,  , then slumped over when EMS arrived.A wound on left leg that is warm and with pus coming out, chronic lower extremity wounds and edema.  Wears 5 L oxygen at baseline. Placed on NRB. BNP 563, Lactic acid 3.1, EKG with Atrial fibrillation with RVR. CXR with persistent atelectasis of right lung. Thoracentesis 8/1 with 1.1 L removed, intubated on 8/2 for Bronchoscopy.   Patient was educated on the sign and symptoms of heart failure, daily weights, when to call her doctor or go to the ED, Diet/ fluid restrictions, taking all medications as prescribed and attending all medical appointments. A HF TOC per Dr. Jomarie Longs was scheduled for 02/04/2023 @ 11 am.   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   EF - 55-60, Tricuspid valve - mild-moderate regurgitation; 12/14/2009 - EF-55, mild to moderate tricuspid regurgitation     Social History   Socioeconomic History   Marital status: Widowed    Spouse name: Not on file   Number of children: 3   Years of education: Not on file   Highest education level: Not on file  Occupational History   Not on file  Tobacco Use   Smoking status: Former    Current packs/day: 0.00    Average packs/day: 1.5 packs/day for 51.0 years (76.5 ttl pk-yrs)    Types: Cigarettes    Start date: 06/11/1955    Quit date: 06/10/2006    Years since quitting: 16.6   Smokeless tobacco: Never  Vaping Use    Vaping status: Never Used  Substance and Sexual Activity   Alcohol use: No   Drug use: No   Sexual activity: Not on file  Other Topics Concern   Not on file  Social History Narrative   Not on file   Social Determinants of Health   Financial Resource Strain: Not on file  Food Insecurity: No Food Insecurity (01/11/2023)   Hunger Vital Sign    Worried About Running Out of Food in the Last Year: Never true    Ran Out of Food in the Last Year: Never true  Transportation Needs: No Transportation Needs (01/11/2023)   PRAPARE - Administrator, Civil Service (Medical): No    Lack of Transportation (Non-Medical): No  Physical Activity: Not on file  Stress: Not on file  Social Connections: Not on file   Education Assessment and Provision:  Detailed education and instructions provided on heart failure disease management including the following:  Signs and symptoms of Heart Failure When to call the physician Importance of daily weights Low sodium diet Fluid restriction Medication management Anticipated future follow-up appointments  Patient education given on each of the above topics.  Patient acknowledges understanding via teach back method and acceptance of all instructions.  Education Materials:  "Living Better With Heart Failure" Booklet, HF zone tool, & Daily Weight Tracker Tool.  Patient has scale at home: Yes Patient has pill box at home: yes  High Risk Criteria for Readmission and/or Poor Patient Outcomes: Heart failure hospital admissions (last 6 months): 0  No Show rate: 3% Difficult social situation: No Demonstrates medication adherence: No Primary Language: English Literacy level: Reading, writing, and comprehension  Barriers of Care:   Medication compliance Diet/ fluid restrictions Daily weights   Considerations/Referrals:   Referral made to Heart Failure Pharmacist Stewardship: Yes Referral made to Heart Failure CSW/NCM TOC: No Referral made  to Heart & Vascular TOC clinic: Yes, per Dr. Jomarie Longs, 02/04/2023 @ 11 am.   Items for Follow-up on DC/TOC: Continued HF education Medication compliance Diet/ fluid restrictions/daily weights    Rhae Hammock, BSN, RN Heart Failure Teacher, adult education Only

## 2023-01-24 NOTE — Progress Notes (Signed)
PROGRESS NOTE   [name]  NWG:[id number]    DOB: Sep 18, 1940    DOA: 01/04/2023  PCP: Herma Carson, MD   Brief Narrative:  82 year old female, PMH of A-fib, DVT/PE, Takotsubo cardiomyopathy, V-fib, chronic systolic CHF, CVA with residual deficits, presented 01/04/2023 with complaints of weakness, dyspnea, lower extremity edema and encephalopathy.  Admitted for acute on chronic hypoxic and hypercapnic respiratory failure, RLL CAP complicated by parapneumonic effusion requiring chest tube, acute on chronic combined CHF, A-fib with RVR and left leg cellulitis.  She had been transferred to ICU under PCCM care.   Chest x-ray 01/09/2023-persistent atelectasis right lung Ultrasound chest 01/08/2023-right effusion, consolidation CT scan of the chest 01/09/2023-complete atelectasis right lung, consolidation left base Thoracentesis 8/1, 1.1 L removed, stopped due to chest discomfort Intubation for bronchoscopy 8/2 with right-sided secretions removed.  Extubated postprocedure 8/5 recurrent total right atelectasis, increased respiratory needs >> BiPAP 8/7 off pressors 8/8 Rt pig tail inserted Code status: DNR Palliative consulted and following 8/14 Transferred back to [facility]  Subjective: Feels better today, breathing continues to improve, heart rate remains elevated   Assessment & Plan:   Acute on chronic hypoxic, hypercapnic respiratory failure. CAP RLL complicated by parapneumonic effusion Acute on chronic combined CHF. Initially RX w/ Diuretics, Foll by bronchoscopy for pulm toilet for white out R lung, then extubated after bronch 8/13foll by intermittent BIPAP -foll by right-sided chest tube, removed 8/13 -Needing BiPAP at night and [place] oxygen during daytime.  Wean O2 down as tolerated. -Continue aggressive pulmonary toilet including flutter valve, incentive spirometry and mobilize. -Completed course of antibiotics. -Recommend repeating chest x-ray in 4 weeks. -Palliative care input 8/13  appreciated.  DNR DNI confirmed.  Ongoing conversations regarding GOC. -Discharge planning, SNF in 48 hours if oxygen requirement and A-fib RVR improves  Acute on chronic combined CHF: Improving on IV Lasix. - 7.7 L negative, weight down 20 LB -Clinically appears euvolemic, transitioned to oral torsemide with low albumin   Hypokalemia: Potassium 2.9, due to aggressive IV diuresis. Magnesium 2.1. Replace p.o. potassium aggressively and follow BMP in AM.  Atrial fibrillation with RVR. Continue amiodarone 200 Mg daily. Remains with mild RVR in the 100s-110s. -Cards following, was limited by hypotension, on midodrine Continue apixaban. -Blood pressure improved will restart Toprol today, limited response to digoxin earlier  Hypotension: SBP consistently in the 80s-90s. Continue midodrine 10 Mg 3 times daily. Asymptomatic. 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  Outpatient follow-up with cardiology.  HLD. Currently not on meds.  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  Lt leg cellulitis Completed course of antibiotics.  Hx gout, depression, constipation, peripheral neuropathy continue PTA wellbutrin, allopurinol, neurontin, tramadol  Body mass index is 32.86 kg/m. Obesity/complicates care.  Outpatient follow-up.  Nutritional Status Nutrition Problem: Increased nutrient needs Etiology: wound healing Signs/Symptoms: estimated needs Interventions: Boost Breeze, MVI, Liberalize Diet  Pressure Ulcer: Pressure Injury 01/04/23 Pretibial Left Stage 2 -  Partial thickness loss of dermis presenting as a shallow open injury with a red, pink wound bed without slough. (Active)  01/04/23 2200  Location: Pretibial  Location Orientation: Left  Staging: Stage 2 -  Partial thickness loss of dermis presenting as a shallow open injury with a red, pink wound bed without slough.  Wound Description (Comments):   Present on  Admission: Yes  Dressing Type Silver hydrofiber;Compression wrap;Gauze (Comment) 01/24/23 5784    DVT prophylaxis:   Full dose apixaban   Code Status: DNR:  Family Communication: None at bedside Disposition: TBD, remains hypoxic     Consultants:   PCCM Cardiology Palliative care medicine  Procedures:   Chest x-ray 01/09/2023-persistent atelectasis right lung Ultrasound chest 01/08/2023-right effusion, consolidation CT scan of the chest 01/09/2023-complete atelectasis right lung, consolidation left base Thoracentesis 8/1, 1.1 L removed, stopped due to chest discomfort Intubation for bronchoscopy 8/2 with right-sided secretions removed.  Extubated postprocedure 8/5 recurrent total right atelectasis, increased respiratory needs >> BiPAP 8/7 off pressors 8/8 Rt pig tail inserted 8/13 right pigtail chest tube removed. Ongoing nightly BiPAP and [place] oxygen during the daytime.  Antimicrobials:   Completed course  Objective:   Vitals:   01/23/23 2334 01/24/23 0331 01/24/23 0500 01/24/23 0727  BP: 101/77 113/67  (!) 144/90  Pulse: (!) 110 (!) 114 (!) 102 (!) 116  Resp: 16 18    Temp: 98.3 F (36.8 C) (!) 97.5 F (36.4 C)  (!) 96.4 F (35.8 C)  TempSrc: Oral Oral  Axillary  SpO2: 93% 98% 95% 92%  Weight:   81.5 kg   Height:        Gen: Awake, Alert, Oriented X 3,  HEENT: no JVD Lungs: Improved air movement, decreased breath sounds at the bases CVS: S1-S2, irregularly irregular Abd: soft, Non tender, non distended, BS present Extremities: No edema Skin: no new rashes on exposed skin    Data Reviewed:   I have personally reviewed following labs and imaging studies   CBC: Recent Labs  Lab 01/19/23 0018 01/22/23 0631 01/24/23 0057  WBC 8.9 7.9 6.5  HGB 12.4 11.5* 11.3*  HCT 40.4 37.4 37.1  MCV 94.6 93.5 94.2  PLT 189 210 252    Basic Metabolic Panel: Recent Labs  Lab 01/19/23 0018 01/20/23 0844 01/22/23 0631 01/23/23 0055 01/24/23 0057  NA 135 137 137 134*  134*  K 4.1 4.2 2.9* 3.9 4.7  CL 91* 89* 87* 90* 93*  CO2 32 36* 37* 32 30  GLUCOSE 107* 132* 109* 105* 125*  BUN 12 13 18 18 21   CREATININE 0.64 0.83 0.86 0.76 1.02*  CALCIUM 9.5 9.9 9.8 10.1 10.1  MG  --  2.1 2.1  --   --   PHOS  --  4.0  --   --   --     Liver Function Tests: No results for input(s): "AST", "ALT", "ALKPHOS", "BILITOT", "PROT", "ALBUMIN" in the last 168 hours.   CBG: No results for input(s): "GLUCAP" in the last 168 hours.   Microbiology Studies:   Recent Results (from the past 240 hour(s))  Body fluid culture w Gram Stain     Status: None   Collection Time: 01/16/23 10:30 AM   Specimen: Pleural Fluid  Result Value Ref Range Status   Specimen Description PLEURAL  Final   Special Requests left  Final   Gram Stain   Final    FEW WBC PRESENT, PREDOMINANTLY MONONUCLEAR NO ORGANISMS SEEN    Culture   Final    NO GROWTH 3 DAYS Performed at [facility], [address] [place], [place] [address]    Report Status 01/19/2023 FINAL  Final  Body fluid culture w Gram Stain     Status: None   Collection Time: 01/16/23 12:11 PM   Specimen: Peritoneal Washings; Body Fluid  Result Value Ref Range Status   Specimen Description PERITONEAL  Final   Special Requests right  Final   Gram Stain   Final    FEW WBC PRESENT,BOTH PMN AND  MONONUCLEAR NO ORGANISMS SEEN    Culture   Final    NO GROWTH 3 DAYS Performed at [facility], [address], [place], [place] [address]    Report Status 01/19/2023 FINAL  Final    Radiology Studies:  No results found.  Scheduled Meds:    allopurinol  100 mg Oral Daily   amiodarone  200 mg Oral BID   apixaban  5 mg Oral Q12H   buPROPion  300 mg Oral Daily   calcium carbonate  1 tablet Oral BID WC   Chlorhexidine Gluconate Cloth  6 each Topical Daily   cholecalciferol  1,000 Units Oral Daily   cyanocobalamin  1,000 mcg Oral Daily   fluticasone  2 spray Each Nare Daily   gabapentin  400 mg Oral Daily    guaiFENesin  600 mg Oral BID   metoprolol succinate  25 mg Oral Daily   midodrine  10 mg Oral TID WC   multivitamin with minerals  1 tablet Oral Daily   pantoprazole  40 mg Oral Daily   pneumococcal 20-valent conjugate vaccine  0.5 mL Intramuscular Tomorrow-1000   polyethylene glycol  17 g Oral BID   potassium chloride  40 mEq Oral Daily   senna-docusate  2 tablet Oral BID   torsemide  20 mg Oral Daily   traMADol  50 mg Oral BID    Continuous Infusions:    sodium chloride Stopped (01/14/23 0222)     LOS: 20 days     Zannie Cove, MD,   [facility]   01/24/2023, 10:04 AM

## 2023-01-24 NOTE — TOC Progression Note (Addendum)
Transition of Care [facility]) - Progression Note    Patient Details  Name: [name] MRN: [id number] Date of Birth: 03/30/1941  [facility] CM/SW Contact  Dellie Burns [place], [place] Phone Number: 01/24/2023, 11:20 AM  Clinical Narrative: provided pt with current SNF bed offers at bedside. Pt's preferred facility [facility] unable to offer a bed. Pt plans to discuss offers with family later today and will update SW with choice. Spoke to [employer] via phone who reports she has a meet with Palliative Medicine at 2pm today and will review SNF offers with pt. Will need auth for SNF.   Dellie Burns, MSW, LCSW [phone] (coverage)        Expected Discharge Plan: Skilled Nursing Facility Barriers to Discharge: Continued Medical Work up, SNF Pending bed offer  Expected Discharge Plan and Services In-house Referral: Clinical Social Work   Post Acute Care Choice: Skilled Nursing Facility Living arrangements for the past 2 months: Single Family Home                                       Social Determinants of Health (SDOH) Interventions SDOH Screenings   Food Insecurity: No Food Insecurity (01/11/2023)  Housing: Low Risk  (01/11/2023)  Transportation Needs: No Transportation Needs (01/11/2023)  Utilities: Not At Risk (01/11/2023)  Tobacco Use: Medium Risk (01/04/2023)    Readmission Risk Interventions     No data to display

## 2023-01-24 NOTE — Progress Notes (Signed)
Physical Therapy Treatment Patient Details Name: [name] MRN: [id number] DOB: 17-Jan-1941 Today's Date: 01/24/2023   History of Present Illness 82 y.o. female admitted 7/27 with weakness, SOB, AMS. Pt with Rt pleural effusion, s/p thoracentesis 8/1. 8/2 intubated, bronch and extubated. 8/4 Afib with RVR. 8/8 thoracentesis and chest tube placement. 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 Pt requires min A-mod A to stand from a chair due to posterior bias. Pt able to tolerate a short gait distance in the room, however reports BLE weakness and sits quickly to a chair due to knees buckling. Pt able to walk back to the recliner without buckling or instability after a seated rest break. Pt able to perform standing marches before requiring another seated break. Pt deferring further mobility due to increased fatigue. Pt continues to benefit from PT services to progress toward functional mobility goals.     If plan is discharge home, recommend the following: A little help with walking and/or transfers;A little help with bathing/dressing/bathroom;Assist for transportation;Help with stairs or ramp for entrance;A lot of help with bathing/dressing/bathroom;Assistance with cooking/housework   Can travel by private vehicle     Yes  Equipment Recommendations  BSC/3in1    Recommendations for Other Services       Precautions / Restrictions Precautions Precautions: Fall Precaution Comments: watch SpO2 Restrictions Weight Bearing Restrictions: No     Mobility  Bed Mobility               General bed mobility comments: Pt beginning and ending session in recliner    Transfers Overall transfer level: Needs assistance Equipment used: Rolling walker (2 wheels) Transfers: Sit to/from Stand Sit to  Stand: Min assist, Mod assist           General transfer comment: STS from chair x2 with min-mod A for power up and cues for anterior lean    Ambulation/Gait Ambulation/Gait assistance: Min assist Gait Distance (Feet): 5 Feet (x2) Assistive device: Rolling walker (2 wheels) Gait Pattern/deviations: Step-through pattern, Trunk flexed, Wide base of support       General Gait Details: Pt demonstrating slow step-through pattern with cues for upright posture and min A for RW management. Pt reporting BLE weakness and demonstrating an uncontrolled sit to the chair requiring max A for guidance due to knees buckling. Pt able to walk back to the recliner after a seated rest break without LOB       Balance Overall balance assessment: Needs assistance Sitting-balance support: No upper extremity supported, Feet supported Sitting balance-Leahy Scale: Fair Sitting balance - Comments: sitting in recliner   Standing balance support: Bilateral upper extremity supported, During functional activity, Reliant on assistive device for balance Standing balance-Leahy Scale: Poor Standing balance comment: with RW support                            Cognition Arousal: Alert Behavior During Therapy: WFL for tasks assessed/performed Overall Cognitive Status: Within Functional Limits for tasks assessed                                          Exercises General Exercises - Lower Extremity Hip Flexion/Marching: AROM, Both, 10 reps,  Standing    General Comments General comments (skin integrity, edema, etc.): Pt on 2L O2 throughout session. Unable to get an SpO2 reading on monitor or portable pulse ox. Pt demonstrating mild DOE and requiring a seated rest break.      Pertinent Vitals/Pain Pain Assessment Pain Assessment: No/denies pain     PT Goals (current goals can now be found in the care plan section) Acute Rehab PT Goals Patient Stated Goal: get better PT Goal  Formulation: With patient Time For Goal Achievement: 01/25/23 Potential to Achieve Goals: Good Progress towards PT goals: Progressing toward goals    Frequency    Min 1X/week      PT Plan         AM-PAC PT "6 Clicks" Mobility   Outcome Measure  Help needed turning from your back to your side while in a flat bed without using bedrails?: A Little Help needed moving from lying on your back to sitting on the side of a flat bed without using bedrails?: A Little Help needed moving to and from a bed to a chair (including a wheelchair)?: A Little Help needed standing up from a chair using your arms (e.g., wheelchair or bedside chair)?: A Little Help needed to walk in hospital room?: A Little Help needed climbing 3-5 steps with a railing? : Total 6 Click Score: 16    End of Session Equipment Utilized During Treatment: Oxygen;Gait belt Activity Tolerance: Patient limited by fatigue Patient left: in chair;with call bell/phone within reach Nurse Communication: Mobility status PT Visit Diagnosis: Muscle weakness (generalized) (M62.81);Difficulty in walking, not elsewhere classified (R26.2);Other abnormalities of gait and mobility (R26.89)     Time: 4540-9811 PT Time Calculation (min) (ACUTE ONLY): 22 min  Charges:    $Therapeutic Activity: 8-22 mins PT General Charges $$ ACUTE PT VISIT: 1 Visit                     Johny Shock, PTA Acute Rehabilitation Services Secure Chat Preferred  [phone]    Johny Shock 01/24/2023, 3:22 PM

## 2023-01-24 NOTE — Progress Notes (Addendum)
Palliative Medicine Inpatient Follow Up Note HPI: 66 F, history of A-fib, DVT/PE, Takotsubo cardiomyopathy with associated systolic CHF, CVA with residual deficits. Admitted with weakness, dyspnea, lower extremity edema and encephalopathy on 7/27.    Palliative care consulted to discuss goals of care in the setting of chronic disease and severe illness.  Today's Discussion 01/24/2023  *Please note that this is a verbal dictation therefore any spelling or grammatical errors are due to the "Dragon [employer] One" system interpretation.  Chart reviewed inclusive of vital signs, progress notes, laboratory results, and diagnostic images.   I met with [name] at bedside this morning. She is sitting up in the recliner in NAD. WE discussed the plan to meet with her daughter this afternoon and I shared a MOST form with her which I stated we would review further.   I met with [name] and her daughter, [name] this afternoon. We discussed the tenuous nature of Chamari's hospitalization. We discussed her chronic disease burden - CHF and Afib in addition to her quite prolonged hospitalization d/t an acute pneumonia.   We reviewed the MOST form further and completed it per patients wishes. She would like to review it with both of her sons prior to signing it.   Created space and opportunity for patient to explore thoughts feelings and fears regarding current medical situation. She shares that he has not given up yet and remains hopeful for improvements.   Worst case scenario's discussed inclusive of declining to a point whereby present measures neglect to help patients long term outcomes. Broached the topic of hospice consideration should that occur. I described hospice as a service for patients who have a life expectancy of 6 months or less. The goal of hospice is the preservation of dignity and quality at the end phases of life. Under hospice care, the focus changes from curative to symptom relief.   We discussed  the idea of SNF with OP Palliative support. Patient is in agreement with this plan.   Questions and concerns addressed/Palliative Support Provided.   Objective Assessment: Vital Signs Vitals:   01/24/23 0500 01/24/23 0727  BP:  (!) 144/90  Pulse: (!) 102 (!) 116  Resp:    Temp:  (!) 96.4 F (35.8 C)  SpO2: 95% 92%    Intake/Output Summary (Last 24 hours) at 01/24/2023 1147 Last data filed at 01/24/2023 1117 Gross per 24 hour  Intake 240 ml  Output 950 ml  Net -710 ml   Last Weight  Most recent update: 01/24/2023  6:43 AM    Weight  81.5 kg (179 lb 10.8 oz)            Gen:  Elderly Caucasian F, in moderate distress HEENT: moist mucous membranes CV: Irregular rate and rhythm  PULM: On 3.5LPM HFNC, breathing is even and nonlabored ABD: soft/nontender  EXT: Pedal edema  Neuro: Alert and oriented x3   SUMMARY OF RECOMMENDATIONS   DNAR/DNI  MOST reviewed and completed   Open and honest conversations in the setting patients significant PNA and heart failure   Allowing time for outcomes  Patient agreeable to OP Palliative support   Ongoing PMT support  Time: 106 Billing based on MDM: High  ______________________________________________________________________________________ Lamarr Lulas [place] Palliative Medicine Team Team Cell Phone: [phone] Please utilize secure chat with additional questions, if there is no response within 30 minutes please call the above phone number  Palliative Medicine Team providers are available by phone from 7am to 7pm daily and can be  reached through the team cell phone.  Should this patient require assistance outside of these hours, please call the patient's attending physician.

## 2023-01-24 NOTE — Care Management (Signed)
Messaged with Marylene Land from [facility] [facility] to let her now patient will be going to SNF

## 2023-01-25 ENCOUNTER — Inpatient Hospital Stay (HOSPITAL_COMMUNITY): Payer: Medicare Other

## 2023-01-25 DIAGNOSIS — J9622 Acute and chronic respiratory failure with hypercapnia: Secondary | ICD-10-CM | POA: Diagnosis not present

## 2023-01-25 DIAGNOSIS — J9621 Acute and chronic respiratory failure with hypoxia: Secondary | ICD-10-CM | POA: Diagnosis not present

## 2023-01-25 LAB — BASIC METABOLIC PANEL
Anion gap: 11 (ref 5–15)
BUN: 25 mg/dL — ABNORMAL HIGH (ref 8–23)
CO2: 30 mmol/L (ref 22–32)
Calcium: 10.1 mg/dL (ref 8.9–10.3)
Chloride: 94 mmol/L — ABNORMAL LOW (ref 98–111)
Creatinine, Ser: 0.98 mg/dL (ref 0.44–1.00)
GFR, Estimated: 58 mL/min — ABNORMAL LOW (ref >60)
Glucose, Bld: 109 mg/dL — ABNORMAL HIGH (ref 70–99)
Potassium: 4.4 mmol/L (ref 3.5–5.1)
Sodium: 135 mmol/L (ref 135–145)

## 2023-01-25 NOTE — Plan of Care (Signed)

## 2023-01-25 NOTE — Progress Notes (Signed)
PROGRESS NOTE   [name]  WUJ:[id number]    DOB: [birth year]    DOA: 01/04/2023  PCP: Herma Carson, MD   Brief Narrative:  82 year old female, PMH of A-fib, DVT/PE, Takotsubo cardiomyopathy, V-fib, chronic systolic CHF, CVA with residual deficits, presented 01/04/2023 with complaints of weakness, dyspnea, lower extremity edema and encephalopathy.  Admitted for acute on chronic hypoxic and hypercapnic respiratory failure, RLL CAP complicated by parapneumonic effusion requiring chest tube, acute on chronic combined CHF, A-fib with RVR and left leg cellulitis.  She had been transferred to ICU under PCCM care.   Chest x-ray 01/09/2023-persistent atelectasis right lung Ultrasound chest 01/08/2023-right effusion, consolidation CT scan of the chest 01/09/2023-complete atelectasis right lung, consolidation left base Thoracentesis 8/1, 1.1 L removed, stopped due to chest discomfort Intubation for bronchoscopy 8/2 with right-sided secretions removed.  Extubated postprocedure 8/5 recurrent total right atelectasis, increased respiratory needs >> BiPAP 8/7 off pressors 8/8 Rt pig tail inserted Code status: DNR Palliative consulted and following 8/14 Transferred back to [facility]  Subjective: Feels a little weak and nauseated, breathing slowly improving, heart rate improving   Assessment & Plan:   Acute on chronic hypoxic, hypercapnic respiratory failure. CAP RLL complicated by parapneumonic effusion Acute on chronic combined CHF. Initially RX w/ Diuretics, Foll by bronchoscopy for pulm toilet for white out R lung, then extubated after bronch 8/68foll by intermittent BIPAP -foll by right-sided chest tube, removed 8/13 -Needing BiPAP at night and [place] oxygen during daytime.  Wean O2 down as tolerated. -Continue aggressive pulmonary toilet including flutter valve, incentive spirometry and mobilize. -Completed course of antibiotics -Palliative care input 8/13 appreciated.  DNR DNI confirmed.  Ongoing  conversations regarding GOC. -Repeat chest x-ray today, wean O2 as tolerated, oral diuretics now  Acute on chronic combined CHF: Improving on IV Lasix. - 7.7 L negative, weight down 20 LB -Clinically appears euvolemic, transitioned to oral torsemide with low albumin   Hypokalemia: -Replaced  Atrial fibrillation with RVR. Continue amiodarone 200 Mg daily. Remains with mild RVR in the 100s-110s. -Cards following, was limited by hypotension, on midodrine Continue apixaban. -Blood pressure improved, restarted Toprol yesterday, blood pressure improving, limited response to digoxin earlier  Hypotension: Continue midodrine 10 Mg 3 times daily. Asymptomatic. 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  Outpatient follow-up with cardiology.  HLD. Currently not on meds.  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  Lt leg cellulitis Completed course of antibiotics.  Hx gout, depression, constipation, peripheral neuropathy continue PTA wellbutrin, allopurinol, neurontin, tramadol  Body mass index is 33.19 kg/m. Obesity/complicates care.  Outpatient follow-up.  Nutritional Status Nutrition Problem: Increased nutrient needs Etiology: wound healing Signs/Symptoms: estimated needs Interventions: Boost Breeze, MVI, Liberalize Diet  Pressure Ulcer: Pressure Injury 01/04/23 Pretibial Left Stage 2 -  Partial thickness loss of dermis presenting as a shallow open injury with a red, pink wound bed without slough. (Active)  01/04/23 2200  Location: Pretibial  Location Orientation: Left  Staging: Stage 2 -  Partial thickness loss of dermis presenting as a shallow open injury with a red, pink wound bed without slough.  Wound Description (Comments):   Present on Admission: Yes  Dressing Type Silver hydrofiber;Compression wrap;Gauze (Comment) 01/24/23 9562    DVT prophylaxis:   Full dose apixaban   Code Status: DNR:  Family Communication: None at  bedside Disposition: TBD, remains hypoxic     Consultants:   PCCM Cardiology Palliative care medicine  Procedures:   Chest x-ray 01/09/2023-persistent atelectasis right  lung Ultrasound chest 01/08/2023-right effusion, consolidation CT scan of the chest 01/09/2023-complete atelectasis right lung, consolidation left base Thoracentesis 8/1, 1.1 L removed, stopped due to chest discomfort Intubation for bronchoscopy 8/2 with right-sided secretions removed.  Extubated postprocedure 8/5 recurrent total right atelectasis, increased respiratory needs >> BiPAP 8/7 off pressors 8/8 Rt pig tail inserted 8/13 right pigtail chest tube removed. Ongoing nightly BiPAP and [place] oxygen during the daytime.  Antimicrobials:   Completed course  Objective:   Vitals:   01/24/23 1953 01/24/23 2301 01/25/23 0330 01/25/23 0741  BP: 108/68 111/68 109/68 105/64  Pulse: 90 96 77 93  Resp: 17  18 18   Temp: 98.3 F (36.8 C) 98.3 F (36.8 C) 98.1 F (36.7 C) 97.9 F (36.6 C)  TempSrc: Oral Oral Oral Oral  SpO2: 98% 97% (!) 89% 96%  Weight:   82.3 kg   Height:        Gen: Pleasant elderly female sitting up in bed, AAOx3 HEENT: No JVD CVS: S1-S2, irregularly irregular Lungs: Decreased breath sounds at the bases Abdomen: Soft, nontender, bowel sounds present Extremities: No edema  Skin: no new rashes on exposed skin    Data Reviewed:   I have personally reviewed following labs and imaging studies   CBC: Recent Labs  Lab 01/19/23 0018 01/22/23 0631 01/24/23 0057  WBC 8.9 7.9 6.5  HGB 12.4 11.5* 11.3*  HCT 40.4 37.4 37.1  MCV 94.6 93.5 94.2  PLT 189 210 252    Basic Metabolic Panel: Recent Labs  Lab 01/20/23 0844 01/22/23 0631 01/23/23 0055 01/24/23 0057 01/25/23 0037  NA 137 137 134* 134* 135  K 4.2 2.9* 3.9 4.7 4.4  CL 89* 87* 90* 93* 94*  CO2 36* 37* 32 30 30  GLUCOSE 132* 109* 105* 125* 109*  BUN 13 18 18 21  25*  CREATININE 0.83 0.86 0.76 1.02* 0.98  CALCIUM 9.9 9.8 10.1  10.1 10.1  MG 2.1 2.1  --   --   --   PHOS 4.0  --   --   --   --     Liver Function Tests: No results for input(s): "AST", "ALT", "ALKPHOS", "BILITOT", "PROT", "ALBUMIN" in the last 168 hours.   CBG: No results for input(s): "GLUCAP" in the last 168 hours.   Microbiology Studies:   Recent Results (from the past 240 hour(s))  Body fluid culture w Gram Stain     Status: None   Collection Time: 01/16/23 10:30 AM   Specimen: Pleural Fluid  Result Value Ref Range Status   Specimen Description PLEURAL  Final   Special Requests left  Final   Gram Stain   Final    FEW WBC PRESENT, PREDOMINANTLY MONONUCLEAR NO ORGANISMS SEEN    Culture   Final    NO GROWTH 3 DAYS Performed at [facility], [address] [place], [place] [address]    Report Status 01/19/2023 FINAL  Final  Body fluid culture w Gram Stain     Status: None   Collection Time: 01/16/23 12:11 PM   Specimen: Peritoneal Washings; Body Fluid  Result Value Ref Range Status   Specimen Description PERITONEAL  Final   Special Requests right  Final   Gram Stain   Final    FEW WBC PRESENT,BOTH PMN AND MONONUCLEAR NO ORGANISMS SEEN    Culture   Final    NO GROWTH 3 DAYS Performed at [facility], [address], [place], [place] [address]    Report  Status 01/19/2023 FINAL  Final    Radiology Studies:  No results found.  Scheduled Meds:    allopurinol  100 mg Oral Daily   amiodarone  200 mg Oral BID   apixaban  5 mg Oral Q12H   buPROPion  300 mg Oral Daily   calcium carbonate  1 tablet Oral BID WC   Chlorhexidine Gluconate Cloth  6 each Topical Daily   cholecalciferol  1,000 Units Oral Daily   cyanocobalamin  1,000 mcg Oral Daily   fluticasone  2 spray Each Nare Daily   gabapentin  400 mg Oral Daily   guaiFENesin  600 mg Oral BID   metoprolol succinate  25 mg Oral Daily   midodrine  10 mg Oral TID WC   multivitamin with minerals  1 tablet Oral Daily   pantoprazole  40 mg Oral Daily    pneumococcal 20-valent conjugate vaccine  0.5 mL Intramuscular Tomorrow-1000   polyethylene glycol  17 g Oral BID   senna-docusate  2 tablet Oral BID   torsemide  20 mg Oral Daily   traMADol  50 mg Oral BID    Continuous Infusions:    sodium chloride Stopped (01/14/23 0222)     LOS: 21 days     Zannie Cove, MD,   [facility]   01/25/2023, 11:23 AM

## 2023-01-25 NOTE — Discharge Instructions (Signed)

## 2023-01-26 DIAGNOSIS — I4891 Unspecified atrial fibrillation: Secondary | ICD-10-CM | POA: Diagnosis not present

## 2023-01-26 DIAGNOSIS — J9601 Acute respiratory failure with hypoxia: Secondary | ICD-10-CM | POA: Diagnosis not present

## 2023-01-26 DIAGNOSIS — I509 Heart failure, unspecified: Secondary | ICD-10-CM | POA: Diagnosis not present

## 2023-01-26 DIAGNOSIS — J9621 Acute and chronic respiratory failure with hypoxia: Secondary | ICD-10-CM | POA: Diagnosis not present

## 2023-01-26 LAB — CBC
HCT: 37.6 % (ref 36.0–46.0)
Hemoglobin: 11.4 g/dL — ABNORMAL LOW (ref 12.0–15.0)
MCH: 29.3 pg (ref 26.0–34.0)
MCHC: 30.3 g/dL (ref 30.0–36.0)
MCV: 96.7 fL (ref 80.0–100.0)
Platelets: 270 10*3/uL (ref 150–400)
RBC: 3.89 MIL/uL (ref 3.87–5.11)
RDW: 15.2 % (ref 11.5–15.5)
WBC: 6.6 10*3/uL (ref 4.0–10.5)
nRBC: 0 % (ref 0.0–0.2)

## 2023-01-26 LAB — BASIC METABOLIC PANEL
Anion gap: 11 (ref 5–15)
BUN: 31 mg/dL — ABNORMAL HIGH (ref 8–23)
CO2: 30 mmol/L (ref 22–32)
Calcium: 10 mg/dL (ref 8.9–10.3)
Chloride: 94 mmol/L — ABNORMAL LOW (ref 98–111)
Creatinine, Ser: 1.35 mg/dL — ABNORMAL HIGH (ref 0.44–1.00)
GFR, Estimated: 39 mL/min — ABNORMAL LOW (ref >60)
Glucose, Bld: 170 mg/dL — ABNORMAL HIGH (ref 70–99)
Potassium: 4.4 mmol/L (ref 3.5–5.1)
Sodium: 135 mmol/L (ref 135–145)

## 2023-01-26 MED ORDER — PROCHLORPERAZINE EDISYLATE 10 MG/2ML IJ SOLN
10.0000 mg | Freq: Four times a day (QID) | INTRAMUSCULAR | Status: DC | PRN
  Administered 2023-01-26: 10 mg via INTRAVENOUS
  Filled 2023-01-26: qty 2

## 2023-01-26 NOTE — TOC Progression Note (Addendum)
Transition of Care [facility]) - Progression Note    Patient Details  Name: [name] MRN: [id number] Date of Birth: Oct 01, 1940  [facility] of [facility]) CM/SW Contact  Patrice Paradise, LCSW Phone Number: 01/26/2023, 11:21 AM  Clinical Narrative:     CSW spoke with pt's daughter [name] to confirm bed choice. Pat chose [facility] stating that it was near where her son works and he can check on daughter. Pat asked for a medical update. CSW explained that she can message the MD and ask him to call her. CSW also inquired about a potential DC date in order to start auth. CSW messaged [facility] to confirm bed awaiting response.  TOC team will continue to assist with discharge planning needs.    Expected Discharge Plan: Skilled Nursing Facility Barriers to Discharge: Continued Medical Work up, SNF Pending bed offer  Expected Discharge Plan and Services In-house Referral: Clinical Social Work   Post Acute Care Choice: Skilled Nursing Facility Living arrangements for the past 2 months: Single Family Home                                       Social Determinants of Health (SDOH) Interventions SDOH Screenings   Food Insecurity: No Food Insecurity (01/24/2023)  Housing: Low Risk  (01/24/2023)  Transportation Needs: No Transportation Needs (01/24/2023)  Utilities: Not At Risk (01/11/2023)  Alcohol Screen: Low Risk  (01/24/2023)  Financial Resource Strain: Low Risk  (01/24/2023)  Tobacco Use: Medium Risk (01/04/2023)    Readmission Risk Interventions     No data to display

## 2023-01-26 NOTE — Progress Notes (Signed)
Patient complaining of nausea.  States that she noticed feeling this way about 5 days ago and wants to know if a medication was changed.  Zofran provided and patient states the feeling is not relieved with medication.  Minimal emesis.  Reported to physician.

## 2023-01-26 NOTE — Hospital Course (Addendum)
The patient is an 82 year old female, PMH of A-fib, DVT/PE, Takotsubo cardiomyopathy, V-fib, chronic systolic CHF, CVA with residual deficits, presented 01/04/2023 with complaints of weakness, dyspnea, lower extremity edema and encephalopathy.  Admitted for acute on chronic hypoxic and hypercapnic respiratory failure, RLL CAP complicated by parapneumonic effusion requiring chest tube, acute on chronic combined CHF, A-fib with RVR and left leg cellulitis.  She had been transferred to ICU under PCCM care.   Chest x-ray 01/09/2023-persistent atelectasis right lung Ultrasound chest 01/08/2023-right effusion, consolidation CT scan of the chest 01/09/2023-complete atelectasis right lung, consolidation left base Thoracentesis 8/1, 1.1 L removed, stopped due to chest discomfort Intubation for bronchoscopy 8/2 with right-sided secretions removed.  Extubated postprocedure 8/5 recurrent total right atelectasis, increased respiratory needs >> BiPAP 8/7 off pressors 8/8 Rt pig tail inserted Code status: DNR Palliative consulted and following 8/14 Transferred back to [facility]  Respiratory Status is improving and Palliative continuing to have GOC discussions. Diuresis has been changed to po now.  Assessment and Plan:  Acute on chronic hypoxic, hypercapnic respiratory failure. CAP RLL complicated by parapneumonic effusion Acute on chronic combined CHF improving  -Initially RX w/ Diuretics, -Pulmonary consulted and did Bronchoscopy for pulm toilet for white out R lung, then extubated after bronch 8/40foll by intermittent BIPAP -Subsequently required a right-sided chest tube, removed 8/13 -Needing BiPAP at night and [place] oxygen during daytime.  Wean O2 down as tolerated. SpO2: 94 % O2 Flow Rate (L/min): 4 L/min FiO2 (%): 70 % -Continue aggressive pulmonary toilet including flutter valve, incentive spirometry and mobilize. -Completed course of antibiotics -Palliative care input 8/13 appreciated.  DNR DNI confirmed.   Ongoing conversations regarding GOC and further discussion they are allowing time for outcomes -Repeat chest x-ray yesterday, wean O2 as tolerated, oral diuretics now -Chest x-ray 8/17 done showed "Right chest tube is no longer seen. There is no pneumothorax. Parenchymal opacities in the medial right mid and lower lung appear unchanged. Minimal retrocardiac opacities persist. The cardiomediastinal silhouette appears stable. There is a small left pleural effusion, unchanged. The osseous structures are stable."   Acute on chronic combined CHF: -Was Improving on IV Lasix and now transitioned to Oral Torsemide 20 mg po Daily  -Strict I's and O's and Daily Weights Filed Weights   01/24/23 0500 01/25/23 0330 01/26/23 0500  Weight: 81.5 kg 82.3 kg 84.2 kg   Intake/Output Summary (Last 24 hours) at 01/26/2023 1847 Last data filed at 01/26/2023 0345 Gross per 24 hour  Intake 120 ml  Output 500 ml  Net -380 ml  -Continue to Monitor for S/Sx of Volume Overload  -Repeat CXR in the AM   Atrial fibrillation with RVR. -Continue amiodarone 200 Mg daily. -Remains with mild RVR in the 100s-110s. -Cardiology was following -Rate Control was limited by hypotension, on midodrine -Continue apixaban. -Blood pressure improved, restarted Metoprolol Succinate 25 mg po Daily the day before yesterday, blood pressure improving, limited response to digoxin earlier -Continue to Monitor on Telemetry    Hypotension -Continue midodrine 10 Mg 3 times daily. -Asymptomatic. 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   -Will need Outpatient follow-up with cardiology.   HLD. -Currently not on meds.   Hx of PE/DVT. -Remains on Anticoagulation with Apixaban 5 mg po BID    Left Leg Cellulitis -Completed course of antibiotics. -WBC Trend: Recent Labs  Lab 01/16/23 0205 01/17/23 0211 01/18/23 4696 01/19/23 0018  01/22/23 0631 01/24/23 0057 01/26/23 0344  WBC 6.5 8.5 9.6 8.9 7.9 6.5 6.6  -Continue to Monitor and Trend   Hx of Gout -C/w Allopurinol 100 mg po Daily   Peripheral Neuropathy -C/w Gabapentin 400 mg po Daily and Tramadol  Depression and Anxiety -C/w Buproprion 300 mg po Daily and   Constipation -C/w Bisacodyl 10 mg RC Daily prn Moderate/Severe Constipation and Lactulose 300 mL RC Severe Constipation and Senna-Docusate 2 tab po BID along with Miralax 17 grams po BID   AKI -BUN/Cr Trend: Recent Labs  Lab 01/19/23 0018 01/20/23 0844 01/22/23 0631 01/23/23 0055 01/24/23 0057 01/25/23 0037 01/26/23 0344  BUN 12 13 18 18 21  25* 31*  CREATININE 0.64 0.83 0.86 0.76 1.02* 0.98 1.35*  -Avoid Nephrotoxic Medications, Contrast Dyes, Hypotension and Dehydration to Ensure Adequate Renal Perfusion and will need to Renally Adjust Meds -IV Diuresis has now stopped and changed to po and will need close monitoring -Continue to Monitor and Trend Renal Function carefully and repeat CMP in the AM   Hypokalemia, improved -Patient's K+ Level Trend: Recent Labs  Lab 01/19/23 0018 01/20/23 0844 01/22/23 0631 01/23/23 0055 01/24/23 0057 01/25/23 0037 01/26/23 0344  K 4.1 4.2 2.9* 3.9 4.7 4.4 4.4  -Continue to Monitor and Replete as Necessary -Repeat CMP in the AM   Normocytic Anemia -Hgb/Hct Trend: Recent Labs  Lab 01/16/23 0205 01/17/23 0211 01/18/23 0307 01/19/23 0018 01/22/23 0631 01/24/23 0057 01/26/23 0344  HGB 11.4* 11.9* 13.6 12.4 11.5* 11.3* 11.4*  HCT 37.3 37.2 43.9 40.4 37.4 37.1 37.6  MCV 93.3 93.2 95.0 94.6 93.5 94.2 96.7  -Check Anemia Panel in the AM -Continue to Monitor for S/Sx of Bleeding; No overt bleeding noted -Repeat CBC in the AM  Increased Nutrient Needs Nutrition Status: Nutrition Problem: Increased nutrient needs Etiology: wound healing Signs/Symptoms: estimated needs Interventions: Boost Breeze, MVI, Liberalize Diet  Pressure Ulcer,  poA Pressure Injury 01/04/23 Pretibial Left Stage 2 -  Partial thickness loss of dermis presenting as a shallow open injury with a red, pink wound bed without slough. (Active)  01/04/23 2200  Location: Pretibial  Location Orientation: Left  Staging: Stage 2 -  Partial thickness loss of dermis presenting as a shallow open injury with a red, pink wound bed without slough.  Wound Description (Comments):   Present on Admission: Yes   Obesity -Complicates overall prognosis and care -Estimated body mass index is 33.95 kg/m as calculated from the following:   Height as of this encounter: 5\' 2"  (1.575 m).   Weight as of this encounter: 84.2 kg.  -Weight Loss and Dietary Counseling given

## 2023-01-26 NOTE — Progress Notes (Addendum)
PROGRESS NOTE    [name]  ZOX:[id number] DOB: 1940/12/17 DOA: 01/04/2023 PCP: Herma Carson, MD   Brief Narrative:  The patient is an 82 year old female, PMH of A-fib, DVT/PE, Takotsubo cardiomyopathy, V-fib, chronic systolic CHF, CVA with residual deficits, presented 01/04/2023 with complaints of weakness, dyspnea, lower extremity edema and encephalopathy.  Admitted for acute on chronic hypoxic and hypercapnic respiratory failure, RLL CAP complicated by parapneumonic effusion requiring chest tube, acute on chronic combined CHF, A-fib with RVR and left leg cellulitis.  She had been transferred to ICU under PCCM care.   Chest x-ray 01/09/2023-persistent atelectasis right lung Ultrasound chest 01/08/2023-right effusion, consolidation CT scan of the chest 01/09/2023-complete atelectasis right lung, consolidation left base Thoracentesis 8/1, 1.1 L removed, stopped due to chest discomfort Intubation for bronchoscopy 8/2 with right-sided secretions removed.  Extubated postprocedure 8/5 recurrent total right atelectasis, increased respiratory needs >> BiPAP 8/7 off pressors 8/8 Rt pig tail inserted Code status: DNR Palliative consulted and following 8/14 Transferred back to [facility]  Respiratory Status is improving and Palliative continuing to have GOC discussions. Diuresis has been changed to po now.  Assessment and Plan:  Acute on chronic hypoxic, hypercapnic respiratory failure. CAP RLL complicated by parapneumonic effusion Acute on chronic combined CHF improving  -Initially RX w/ Diuretics, -Pulmonary consulted and did Bronchoscopy for pulm toilet for white out R lung, then extubated after bronch 8/96foll by intermittent BIPAP -Subsequently required a right-sided chest tube, removed 8/13 -Needing BiPAP at night and [place] oxygen during daytime.  Wean O2 down as tolerated. SpO2: 94 % O2 Flow Rate (L/min): 4 L/min FiO2 (%): 70 % -Continue aggressive pulmonary toilet including flutter valve,  incentive spirometry and mobilize. -Completed course of antibiotics -Palliative care input 8/13 appreciated.  DNR DNI confirmed.  Ongoing conversations regarding GOC and further discussion they are allowing time for outcomes -Repeat chest x-ray yesterday, wean O2 as tolerated, oral diuretics now -Chest x-ray 8/17 done showed "Right chest tube is no longer seen. There is no pneumothorax. Parenchymal opacities in the medial right mid and lower lung appear unchanged. Minimal retrocardiac opacities persist. The cardiomediastinal silhouette appears stable. There is a small left pleural effusion, unchanged. The osseous structures are stable."   Acute on chronic combined CHF: -Was Improving on IV Lasix and now transitioned to Oral Torsemide 20 mg po Daily  -Strict I's and O's and Daily Weights Filed Weights   01/24/23 0500 01/25/23 0330 01/26/23 0500  Weight: 81.5 kg 82.3 kg 84.2 kg   Intake/Output Summary (Last 24 hours) at 01/26/2023 1847 Last data filed at 01/26/2023 0345 Gross per 24 hour  Intake 120 ml  Output 500 ml  Net -380 ml  -Continue to Monitor for S/Sx of Volume Overload  -Repeat CXR in the AM   Atrial fibrillation with RVR. -Continue amiodarone 200 Mg daily. -Remains with mild RVR in the 100s-110s. -Cardiology was following -Rate Control was limited by hypotension, on midodrine -Continue apixaban. -Blood pressure improved, restarted Metoprolol Succinate 25 mg po Daily the day before yesterday, blood pressure improving, limited response to digoxin earlier -Continue to Monitor on Telemetry    Hypotension -Continue midodrine 10 Mg 3 times daily. -Asymptomatic. 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   -Will need Outpatient follow-up with cardiology.   HLD. -Currently not on meds.   Hx of PE/DVT. -Remains on Anticoagulation with Apixaban 5 mg po BID  Left Leg  Cellulitis -Completed course of antibiotics. -WBC Trend: Recent Labs  Lab 01/16/23 0205 01/17/23 0211 01/18/23 0307 01/19/23 0018 01/22/23 0631 01/24/23 0057 01/26/23 0344  WBC 6.5 8.5 9.6 8.9 7.9 6.5 6.6  -Continue to Monitor and Trend   Hx of Gout -C/w Allopurinol 100 mg po Daily   Peripheral Neuropathy -C/w Gabapentin 400 mg po Daily and Tramadol  Depression and Anxiety -C/w Buproprion 300 mg po Daily and   Constipation -C/w Bisacodyl 10 mg RC Daily prn Moderate/Severe Constipation and Lactulose 300 mL RC Severe Constipation and Senna-Docusate 2 tab po BID along with Miralax 17 grams po BID   AKI -BUN/Cr Trend: Recent Labs  Lab 01/19/23 0018 01/20/23 0844 01/22/23 0631 01/23/23 0055 01/24/23 0057 01/25/23 0037 01/26/23 0344  BUN 12 13 18 18 21  25* 31*  CREATININE 0.64 0.83 0.86 0.76 1.02* 0.98 1.35*  -Avoid Nephrotoxic Medications, Contrast Dyes, Hypotension and Dehydration to Ensure Adequate Renal Perfusion and will need to Renally Adjust Meds -IV Diuresis has now stopped and changed to po and will need close monitoring -Continue to Monitor and Trend Renal Function carefully and repeat CMP in the AM   Hypokalemia, improved -Patient's K+ Level Trend: Recent Labs  Lab 01/19/23 0018 01/20/23 0844 01/22/23 0631 01/23/23 0055 01/24/23 0057 01/25/23 0037 01/26/23 0344  K 4.1 4.2 2.9* 3.9 4.7 4.4 4.4  -Continue to Monitor and Replete as Necessary -Repeat CMP in the AM   Normocytic Anemia -Hgb/Hct Trend: Recent Labs  Lab 01/16/23 0205 01/17/23 0211 01/18/23 0307 01/19/23 0018 01/22/23 0631 01/24/23 0057 01/26/23 0344  HGB 11.4* 11.9* 13.6 12.4 11.5* 11.3* 11.4*  HCT 37.3 37.2 43.9 40.4 37.4 37.1 37.6  MCV 93.3 93.2 95.0 94.6 93.5 94.2 96.7  -Check Anemia Panel in the AM -Continue to Monitor for S/Sx of Bleeding; No overt bleeding noted -Repeat CBC in the AM  Increased Nutrient Needs Nutrition Status: Nutrition Problem: Increased nutrient  needs Etiology: wound healing Signs/Symptoms: estimated needs Interventions: Boost Breeze, MVI, Liberalize Diet  Pressure Ulcer, poA Pressure Injury 01/04/23 Pretibial Left Stage 2 -  Partial thickness loss of dermis presenting as a shallow open injury with a red, pink wound bed without slough. (Active)  01/04/23 2200  Location: Pretibial  Location Orientation: Left  Staging: Stage 2 -  Partial thickness loss of dermis presenting as a shallow open injury with a red, pink wound bed without slough.  Wound Description (Comments):   Present on Admission: Yes   Obesity -Complicates overall prognosis and care -Estimated body mass index is 33.95 kg/m as calculated from the following:   Height as of this encounter: 5\' 2"  (1.575 m).   Weight as of this encounter: 84.2 kg.  -Weight Loss and Dietary Counseling given   DVT prophylaxis:  apixaban (ELIQUIS) tablet 5 mg    Code Status: DNR Family Communication: No family present at bedside  Disposition Plan:  Level of care: Progressive Status is: Inpatient Remains inpatient appropriate because: Needs further clinical improvement and needs SNF bed and auth   Consultants:  Palliative Care Medicine PCCM/Pulmonary  Procedures:  As delineated  Antimicrobials:  Anti-infectives (From admission, onward)    Start     Dose/Rate Route Frequency Ordered Stop   01/18/23 1400  amoxicillin-clavulanate (AUGMENTIN) 875-125 MG per tablet 1 tablet        1 tablet Oral Every 12 hours 01/18/23 1312 01/19/23 2146   01/14/23 0930  Ampicillin-Sulbactam (UNASYN) 3 g in sodium chloride 0.9 % 100 mL IVPB  Status:  Discontinued        3 g 200 mL/hr over 30 Minutes Intravenous Every 6 hours 01/14/23 0833 01/18/23 1312   01/11/23 1000  azithromycin (ZITHROMAX) tablet 500 mg        500 mg Oral Daily 01/10/23 1609 01/14/23 2359   01/10/23 1411  azithromycin (ZITHROMAX) tablet 500 mg  Status:  Discontinued        500 mg Per Tube Daily 01/10/23 1411 01/10/23 1609    01/10/23 1000  azithromycin (ZITHROMAX) tablet 500 mg  Status:  Discontinued        500 mg Oral Daily 01/10/23 0700 01/10/23 1411   01/10/23 0800  cefTRIAXone (ROCEPHIN) 1 g in sodium chloride 0.9 % 100 mL IVPB        1 g 200 mL/hr over 30 Minutes Intravenous Every 24 hours 01/10/23 0701 01/13/23 0851   01/05/23 2200  doxycycline (VIBRA-TABS) tablet 100 mg  Status:  Discontinued        100 mg Oral Every 12 hours 01/05/23 0717 01/08/23 1104   01/05/23 0600  doxycycline (VIBRAMYCIN) 100 mg in sodium chloride 0.9 % 250 mL IVPB  Status:  Discontinued        100 mg 125 mL/hr over 120 Minutes Intravenous Every 12 hours 01/04/23 2007 01/05/23 0717   01/04/23 1500  cefTRIAXone (ROCEPHIN) 2 g in sodium chloride 0.9 % 100 mL IVPB        2 g 200 mL/hr over 30 Minutes Intravenous Every 24 hours 01/04/23 1457 01/08/23 1921   01/04/23 1500  doxycycline (VIBRAMYCIN) 100 mg in sodium chloride 0.9 % 250 mL IVPB        100 mg 125 mL/hr over 120 Minutes Intravenous  Once 01/04/23 1457 01/04/23 1942       Subjective: Seen and examined at bedside thinks her breathing is doing okay.  Denied any chest pain.  She is hard of hearing.  Denies any specific complaints and thinks her swelling is improved.  Objective: Vitals:   01/26/23 0500 01/26/23 0821 01/26/23 1148 01/26/23 1616  BP:  101/81 107/71 125/84  Pulse:  96 98 99  Resp:  16 17 19   Temp:   97.7 F (36.5 C) 98.1 F (36.7 C)  TempSrc:   Oral Oral  SpO2:  92% 92% 94%  Weight: 84.2 kg     Height:        Intake/Output Summary (Last 24 hours) at 01/26/2023 1859 Last data filed at 01/26/2023 0345 Gross per 24 hour  Intake 120 ml  Output 500 ml  Net -380 ml   Filed Weights   01/24/23 0500 01/25/23 0330 01/26/23 0500  Weight: 81.5 kg 82.3 kg 84.2 kg   Examination: Physical Exam:  Constitutional: WN/WD obese chronically ill-appearing elderly Caucasian female in no acute distress sitting in the chair Respiratory: Diminished to auscultation  bilaterally, no wheezing, rales, rhonchi or crackles. Normal respiratory effort and patient is not tachypenic. No accessory muscle use.  Unlabored breathing Cardiovascular: RRR, no murmurs / rubs / gallops. S1 and S2 auscultated.  Mild 1+ lower extremity edema Abdomen: Soft, non-tender, distended secondary body habitus. Bowel sounds positive.  GU: Deferred. Musculoskeletal: No clubbing / cyanosis of digits/nails. No joint deformity upper and lower extremities.  Skin: No rashes, lesions, ulcers on limited skin evaluation but left leg is slightly bigger but more erythematous. No induration; Warm and dry.  Neurologic: CN 2-12 grossly intact with no focal deficits but she is hard of hearing. Romberg sign and cerebellar reflexes not assessed.  Psychiatric: Normal judgment and insight. Alert and oriented x 3. Normal mood and appropriate affect.   Data Reviewed: I have personally reviewed following labs and imaging studies  CBC: Recent Labs  Lab 01/22/23 0631 01/24/23 0057 01/26/23 0344  WBC 7.9 6.5 6.6  HGB 11.5* 11.3* 11.4*  HCT 37.4 37.1 37.6  MCV 93.5 94.2 96.7  PLT 210 252 270   Basic Metabolic Panel: Recent Labs  Lab 01/20/23 0844 01/22/23 0631 01/23/23 0055 01/24/23 0057 01/25/23 0037 01/26/23 0344  NA 137 137 134* 134* 135 135  K 4.2 2.9* 3.9 4.7 4.4 4.4  CL 89* 87* 90* 93* 94* 94*  CO2 36* 37* 32 30 30 30   GLUCOSE 132* 109* 105* 125* 109* 170*  BUN 13 18 18 21  25* 31*  CREATININE 0.83 0.86 0.76 1.02* 0.98 1.35*  CALCIUM 9.9 9.8 10.1 10.1 10.1 10.0  MG 2.1 2.1  --   --   --   --   PHOS 4.0  --   --   --   --   --    GFR: Estimated Creatinine Clearance: 32.3 mL/min (A) (by C-G formula based on SCr of 1.35 mg/dL (H)). Liver Function Tests: No results for input(s): "AST", "ALT", "ALKPHOS", "BILITOT", "PROT", "ALBUMIN" in the last 168 hours. No results for input(s): "LIPASE", "AMYLASE" in the last 168 hours. No results for input(s): "AMMONIA" in the last 168  hours. Coagulation Profile: No results for input(s): "INR", "PROTIME" in the last 168 hours. Cardiac Enzymes: No results for input(s): "CKTOTAL", "CKMB", "CKMBINDEX", "TROPONINI" in the last 168 hours. BNP (last 3 results) No results for input(s): "PROBNP" in the last 8760 hours. HbA1C: No results for input(s): "HGBA1C" in the last 72 hours. CBG: No results for input(s): "GLUCAP" in the last 168 hours. Lipid Profile: No results for input(s): "CHOL", "HDL", "LDLCALC", "TRIG", "CHOLHDL", "LDLDIRECT" in the last 72 hours. Thyroid Function Tests: No results for input(s): "TSH", "T4TOTAL", "FREET4", "T3FREE", "THYROIDAB" in the last 72 hours. Anemia Panel: No results for input(s): "VITAMINB12", "FOLATE", "FERRITIN", "TIBC", "IRON", "RETICCTPCT" in the last 72 hours. Sepsis Labs: No results for input(s): "PROCALCITON", "LATICACIDVEN" in the last 168 hours.  No results found for this or any previous visit (from the past 240 hour(s)).    Radiology Studies: DG Chest 2 View  Result Date: 01/25/2023 CLINICAL DATA:  Hypoxia EXAM: CHEST - 2 VIEW COMPARISON:  Chest x-ray 01/21/2023.  Chest CT 01/20/2023. FINDINGS: Right chest tube is no longer seen. There is no pneumothorax. Parenchymal opacities in the medial right mid and lower lung appear unchanged. Minimal retrocardiac opacities persist. The cardiomediastinal silhouette appears stable. There is a small left pleural effusion, unchanged. The osseous structures are stable. IMPRESSION: 1. Right chest tube is no longer seen. No pneumothorax. 2. Unchanged parenchymal opacities in the medial right mid and lower lung. Electronically Signed   By: Darliss Cheney M.D.   On: 01/25/2023 17:47    Scheduled Meds:  allopurinol  100 mg Oral Daily   amiodarone  200 mg Oral BID   apixaban  5 mg Oral Q12H   buPROPion  300 mg Oral Daily   calcium carbonate  1 tablet Oral BID WC   Chlorhexidine Gluconate Cloth  6 each Topical Daily   cholecalciferol  1,000 Units  Oral Daily   cyanocobalamin  1,000 mcg Oral Daily   fluticasone  2 spray Each Nare Daily   gabapentin  400 mg Oral Daily   guaiFENesin  600 mg Oral BID  metoprolol succinate  25 mg Oral Daily   midodrine  10 mg Oral TID WC   multivitamin with minerals  1 tablet Oral Daily   pantoprazole  40 mg Oral Daily   pneumococcal 20-valent conjugate vaccine  0.5 mL Intramuscular Tomorrow-1000   polyethylene glycol  17 g Oral BID   senna-docusate  2 tablet Oral BID   torsemide  20 mg Oral Daily   traMADol  50 mg Oral BID   Continuous Infusions:  sodium chloride Stopped (01/14/23 0222)    LOS: 22 days   Marguerita Merles, DO [facility] Available via Epic secure chat 7am-7pm After these hours, please refer to coverage provider listed on [email or web address] 01/26/2023, 6:59 PM

## 2023-01-26 NOTE — Plan of Care (Signed)
  Problem: Education: Goal: Knowledge of General Education information will improve Description: Including pain rating scale, medication(s)/side effects and non-pharmacologic comfort measures Outcome: Progressing   Problem: Health Behavior/Discharge Planning: Goal: Ability to manage health-related needs will improve Outcome: Not Progressing   Problem: Clinical Measurements: Goal: Ability to maintain clinical measurements within normal limits will improve Outcome: Progressing

## 2023-01-26 NOTE — Progress Notes (Signed)
   Palliative Medicine Inpatient Follow Up Note HPI: 38 F, history of A-fib, DVT/PE, Takotsubo cardiomyopathy with associated systolic CHF, CVA with residual deficits. Admitted with weakness, dyspnea, lower extremity edema and encephalopathy on 7/27.    Palliative care consulted to discuss goals of care in the setting of chronic disease and severe illness.  Today's Discussion 01/26/2023  *Please note that this is a verbal dictation therefore any spelling or grammatical errors are due to the "Dragon Medical One" system interpretation.  Chart reviewed inclusive of vital signs, progress notes, laboratory results, and diagnostic images.   I met with [name] at bedside this morning. She shares that her nausea which she has been experiencing on and off is better this morning. She states her remedy of "coke and crackers" is what has helped.   We reviewed the MOST form again as below:  Cardiopulmonary Resuscitation: Do Not Attempt Resuscitation (DNR/No CPR)  Medical Interventions: Limited Additional Interventions: Use medical treatment, IV fluids and cardiac monitoring as indicated, DO NOT USE intubation or mechanical ventilation. May consider use of less invasive airway support such as BiPAP or CPAP. Also provide comfort measures. Transfer to the hospital if indicated. Avoid intensive care.   Antibiotics: Antibiotics if indicated  IV Fluids: IV fluids if indicated  Feeding Tube: Feeding tube for a defined trial period   [place] is in agreement with transition to SNF when a bed is available.   Questions and concerns addressed/Palliative Support Provided.   Objective Assessment: Vital Signs Vitals:   01/26/23 0821 01/26/23 1148  BP: 101/81 107/71  Pulse: 96 98  Resp: 16 17  Temp:  97.7 F (36.5 C)  SpO2: 92% 92%    Intake/Output Summary (Last 24 hours) at 01/26/2023 1159 Last data filed at 01/26/2023 0345 Gross per 24 hour  Intake 120 ml  Output 800 ml  Net -680 ml   Last Weight  Most  recent update: 01/26/2023  5:48 AM    Weight  84.2 kg (185 lb 10 oz)            Gen:  Elderly Caucasian F, in moderate distress HEENT: moist mucous membranes CV: Irregular rate and rhythm  PULM: On 3.5LPM HFNC, breathing is even and nonlabored ABD: soft/nontender  EXT: Pedal edema  Neuro: Alert and oriented x3   SUMMARY OF RECOMMENDATIONS   DNAR/DNI  MOST Completed, paper copy placed onto the chart electric copy can be found in [facility]  DNR Form Completed, paper copy placed onto the chart electric copy can be found in [employer]  Allowing time for outcomes  Patient agreeable to OP Palliative support   Ongoing PMT support  Billing based on MDM: High  ______________________________________________________________________________________ Lamarr Lulas [place] Palliative Medicine Team Team Cell Phone: [phone] Please utilize secure chat with additional questions, if there is no response within 30 minutes please call the above phone number  Palliative Medicine Team providers are available by phone from 7am to 7pm daily and can be reached through the team cell phone.  Should this patient require assistance outside of these hours, please call the patient's attending physician.

## 2023-01-26 NOTE — Plan of Care (Signed)

## 2023-01-27 ENCOUNTER — Inpatient Hospital Stay (HOSPITAL_COMMUNITY): Payer: Medicare Other

## 2023-01-27 DIAGNOSIS — J9622 Acute and chronic respiratory failure with hypercapnia: Secondary | ICD-10-CM | POA: Diagnosis not present

## 2023-01-27 DIAGNOSIS — J9621 Acute and chronic respiratory failure with hypoxia: Secondary | ICD-10-CM | POA: Diagnosis not present

## 2023-01-27 LAB — PHOSPHORUS: Phosphorus: 4.3 mg/dL (ref 2.5–4.6)

## 2023-01-27 LAB — FOLATE: Folate: 27.1 ng/mL (ref >5.9)

## 2023-01-27 LAB — COMPREHENSIVE METABOLIC PANEL
ALT: 32 U/L (ref 0–44)
AST: 40 U/L (ref 15–41)
Albumin: 3 g/dL — ABNORMAL LOW (ref 3.5–5.0)
Alkaline Phosphatase: 123 U/L (ref 38–126)
Anion gap: 11 (ref 5–15)
BUN: 33 mg/dL — ABNORMAL HIGH (ref 8–23)
CO2: 32 mmol/L (ref 22–32)
Calcium: 9.7 mg/dL (ref 8.9–10.3)
Chloride: 91 mmol/L — ABNORMAL LOW (ref 98–111)
Creatinine, Ser: 1.33 mg/dL — ABNORMAL HIGH (ref 0.44–1.00)
GFR, Estimated: 40 mL/min — ABNORMAL LOW (ref >60)
Glucose, Bld: 83 mg/dL (ref 70–99)
Potassium: 3.9 mmol/L (ref 3.5–5.1)
Sodium: 134 mmol/L — ABNORMAL LOW (ref 135–145)
Total Bilirubin: 0.8 mg/dL (ref 0.3–1.2)
Total Protein: 6.4 g/dL — ABNORMAL LOW (ref 6.5–8.1)

## 2023-01-27 LAB — CBC WITH DIFFERENTIAL/PLATELET
Abs Immature Granulocytes: 0.35 10*3/uL — ABNORMAL HIGH (ref 0.00–0.07)
Basophils Absolute: 0 10*3/uL (ref 0.0–0.1)
Basophils Relative: 1 %
Eosinophils Absolute: 0 10*3/uL (ref 0.0–0.5)
Eosinophils Relative: 1 %
HCT: 37.2 % (ref 36.0–46.0)
Hemoglobin: 11.2 g/dL — ABNORMAL LOW (ref 12.0–15.0)
Immature Granulocytes: 5 %
Lymphocytes Relative: 19 %
Lymphs Abs: 1.2 10*3/uL (ref 0.7–4.0)
MCH: 28.4 pg (ref 26.0–34.0)
MCHC: 30.1 g/dL (ref 30.0–36.0)
MCV: 94.4 fL (ref 80.0–100.0)
Monocytes Absolute: 0.6 10*3/uL (ref 0.1–1.0)
Monocytes Relative: 10 %
Neutro Abs: 4.2 10*3/uL (ref 1.7–7.7)
Neutrophils Relative %: 64 %
Platelets: 273 10*3/uL (ref 150–400)
RBC: 3.94 MIL/uL (ref 3.87–5.11)
RDW: 15.1 % (ref 11.5–15.5)
WBC: 6.4 10*3/uL (ref 4.0–10.5)
nRBC: 0.3 % — ABNORMAL HIGH (ref 0.0–0.2)

## 2023-01-27 LAB — IRON AND TIBC
Iron: 48 ug/dL (ref 28–170)
Saturation Ratios: 16 % (ref 10.4–31.8)
TIBC: 298 ug/dL (ref 250–450)
UIBC: 250 ug/dL

## 2023-01-27 LAB — RETICULOCYTES
Immature Retic Fract: 26.9 % — ABNORMAL HIGH (ref 2.3–15.9)
RBC.: 3.95 MIL/uL (ref 3.87–5.11)
Retic Count, Absolute: 96.8 10*3/uL (ref 19.0–186.0)
Retic Ct Pct: 2.5 % (ref 0.4–3.1)

## 2023-01-27 LAB — VITAMIN B12: Vitamin B-12: 3017 pg/mL — ABNORMAL HIGH (ref 180–914)

## 2023-01-27 LAB — FERRITIN: Ferritin: 477 ng/mL — ABNORMAL HIGH (ref 11–307)

## 2023-01-27 LAB — MAGNESIUM: Magnesium: 2.3 mg/dL (ref 1.7–2.4)

## 2023-01-27 MED ORDER — METOPROLOL SUCCINATE ER 25 MG PO TB24
12.5000 mg | ORAL_TABLET | Freq: Every day | ORAL | Status: DC
  Administered 2023-01-27 – 2023-01-30 (×3): 12.5 mg via ORAL
  Filled 2023-01-27 (×4): qty 1

## 2023-01-27 NOTE — Plan of Care (Signed)
POc progressing

## 2023-01-27 NOTE — Progress Notes (Addendum)
   Palliative Medicine Inpatient Follow Up Note HPI: 37 F, history of A-fib, DVT/PE, Takotsubo cardiomyopathy with associated systolic CHF, CVA with residual deficits. Admitted with weakness, dyspnea, lower extremity edema and encephalopathy on 7/27.    Palliative care consulted to discuss goals of care in the setting of chronic disease and severe illness.  Today's Discussion 01/27/2023  *Please note that this is a verbal dictation therefore any spelling or grammatical errors are due to the "Dragon Medical One" system interpretation.  Chart reviewed inclusive of vital signs, progress notes, laboratory results, and diagnostic images. Required bipap overnight, RR status improved thereafter.   I met with Baeley at bedside this morning, she is awake and alert in NAD. She shares that her breakfast tray is not near her. I was able to assist in adjusting this.   At this time awaiting placement - family has requested [facility].   Questions and concerns addressed/Palliative Support Provided.   Objective Assessment: Vital Signs Vitals:   01/27/23 0500 01/27/23 0901  BP: (!) 90/53 97/66  Pulse: 86   Resp: 19   Temp: 97.6 F (36.4 C) 98.9 F (37.2 C)  SpO2: 98% 96%    Intake/Output Summary (Last 24 hours) at 01/27/2023 0913 Last data filed at 01/27/2023 1610 Gross per 24 hour  Intake --  Output 550 ml  Net -550 ml   Last Weight  Most recent update: 01/27/2023  4:16 AM    Weight  81.2 kg (179 lb 0.2 oz)            Gen:  Elderly Caucasian F, in moderate distress HEENT: moist mucous membranes CV: Irregular rate and rhythm  PULM: On 6LPM [place], breathing is even and nonlabored ABD: soft/nontender  EXT: Pedal edema  Neuro: Alert and oriented x3   SUMMARY OF RECOMMENDATIONS   DNAR/DNI  MOST/DNR Form Completed, paper copy placed onto the chart electric copy can be found in [employer]  Allowing time for outcomes  Referral for OP Palliative support   Ongoing PMT support  Billing  based on MDM: Low ______________________________________________________________________________________ Lamarr Lulas [place] Palliative Medicine Team Team Cell Phone: [phone] Please utilize secure chat with additional questions, if there is no response within 30 minutes please call the above phone number  Palliative Medicine Team providers are available by phone from 7am to 7pm daily and can be reached through the team cell phone.  Should this patient require assistance outside of these hours, please call the patient's attending physician.

## 2023-01-27 NOTE — Plan of Care (Signed)

## 2023-01-27 NOTE — TOC Progression Note (Signed)
Transition of Care [facility]) - Progression Note    Patient Details  Name: [name] MRN: [id number] Date of Birth: Jan 28, 1941  [facility] CM/SW Contact  Delilah Shan, LCSWA Phone Number: 01/27/2023, 12:17 PM  Clinical Narrative:     CSW spoke with Whitney Post with [facility] who confirmed SNF bed for patient. CSW following to start insurance authorization closer to patient being medically ready for dc.  Expected Discharge Plan: Skilled Nursing Facility Barriers to Discharge: Continued Medical Work up, SNF Pending bed offer  Expected Discharge Plan and Services In-house Referral: Clinical Social Work   Post Acute Care Choice: Skilled Nursing Facility Living arrangements for the past 2 months: Single Family Home                                       Social Determinants of Health (SDOH) Interventions SDOH Screenings   Food Insecurity: No Food Insecurity (01/24/2023)  Housing: Low Risk  (01/24/2023)  Transportation Needs: No Transportation Needs (01/24/2023)  Utilities: Not At Risk (01/11/2023)  Alcohol Screen: Low Risk  (01/24/2023)  Financial Resource Strain: Low Risk  (01/24/2023)  Tobacco Use: Medium Risk (01/04/2023)    Readmission Risk Interventions     No data to display

## 2023-01-27 NOTE — Progress Notes (Signed)
PROGRESS NOTE   [name]  ZOX:[id number]    DOB: 25-Feb-1941    DOA: 01/04/2023  PCP: Herma Carson, MD   Brief Narrative:  82 year old female, PMH of A-fib, DVT/PE, Takotsubo cardiomyopathy, V-fib, chronic systolic CHF, CVA with residual deficits, presented 01/04/2023 with complaints of weakness, dyspnea, lower extremity edema and encephalopathy.  Admitted for acute on chronic hypoxic and hypercapnic respiratory failure, RLL CAP complicated by parapneumonic effusion requiring chest tube, acute on chronic combined CHF, A-fib with RVR and left leg cellulitis.  She had been transferred to ICU under PCCM care.   Chest x-ray 01/09/2023-persistent atelectasis right lung Ultrasound chest 01/08/2023-right effusion, consolidation CT scan of the chest 01/09/2023-complete atelectasis right lung, consolidation left base Thoracentesis 8/1, 1.1 L removed, stopped due to chest discomfort Intubation for bronchoscopy 8/2 with right-sided secretions removed.  Extubated postprocedure 8/5 recurrent total right atelectasis, increased respiratory needs >> BiPAP 8/7 off pressors 8/8 Rt pig tail inserted Code status: DNR Palliative consulted and following 8/14 Transferred back to [facility]  Subjective: Feels a little weak and nauseated, breathing slowly improving, heart rate improving   Assessment & Plan:   Acute on chronic hypoxic, hypercapnic respiratory failure. CAP RLL complicated by parapneumonic effusion Acute on chronic combined CHF. Initially RX w/ Diuretics, Foll by bronchoscopy for pulm toilet for white out R lung, then extubated after bronch 8/28foll by intermittent BIPAP -foll by right-sided chest tube, removed 8/13 -Needing BiPAP at night and [place] oxygen during daytime.  Wean O2 down as tolerated. -Continue aggressive pulmonary toilet  -Completed course of antibiotics -Palliative care input 8/13 appreciated.  DNR DNI confirmed.  Ongoing conversations regarding GOC. -Increased oxygen requirements  yesterday, repeat x-ray with small pleural effusions and some persistent infiltrates, do not suspect new infection, continue torsemide, encouraged incentive spirometry, flutter valve, out of bed to chair  Acute on chronic combined CHF: Improving on IV Lasix. -8.6 L negative, weight down 20 LB -Clinically appears euvolemic, transitioned to oral torsemide with low albumin   Hypokalemia: -Replaced  Atrial fibrillation with RVR. Continue amiodarone 200 Mg daily. Remains with mild RVR in the 100s-110s. -Cards following, was limited by hypotension, on midodrine Continue apixaban. -Restarted on Toprol few days ago, blood pressure soft but stable, limited response to digoxin earlier  Hypotension: Continue midodrine 10 Mg 3 times daily. Asymptomatic. 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  Outpatient follow-up with cardiology.  HLD. Currently not on meds.  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  Lt leg cellulitis Completed course of antibiotics.  Hx gout, depression, constipation, peripheral neuropathy continue PTA wellbutrin, allopurinol, neurontin, tramadol  Body mass index is 32.74 kg/m. Obesity/complicates care.  Outpatient follow-up.  Nutritional Status Nutrition Problem: Increased nutrient needs Etiology: wound healing Signs/Symptoms: estimated needs Interventions: Boost Breeze, MVI, Liberalize Diet  Pressure Ulcer: Pressure Injury 01/04/23 Pretibial Left Stage 2 -  Partial thickness loss of dermis presenting as a shallow open injury with a red, pink wound bed without slough. (Active)  01/04/23 2200  Location: Pretibial  Location Orientation: Left  Staging: Stage 2 -  Partial thickness loss of dermis presenting as a shallow open injury with a red, pink wound bed without slough.  Wound Description (Comments):   Present on Admission: Yes  Dressing Type Silver hydrofiber 01/27/23 0800    DVT prophylaxis:   Full dose apixaban   Code  Status: DNR:  Family Communication: None at bedside Disposition: TBD, remains hypoxic     Consultants:   PCCM  Cardiology Palliative care medicine  Procedures:   Chest x-ray 01/09/2023-persistent atelectasis right lung Ultrasound chest 01/08/2023-right effusion, consolidation CT scan of the chest 01/09/2023-complete atelectasis right lung, consolidation left base Thoracentesis 8/1, 1.1 L removed, stopped due to chest discomfort Intubation for bronchoscopy 8/2 with right-sided secretions removed.  Extubated postprocedure 8/5 recurrent total right atelectasis, increased respiratory needs >> BiPAP 8/7 off pressors 8/8 Rt pig tail inserted 8/13 right pigtail chest tube removed. Ongoing nightly BiPAP and [place] oxygen during the daytime.  Antimicrobials:   Completed course  Objective:   Vitals:   01/27/23 0300 01/27/23 0415 01/27/23 0500 01/27/23 0901  BP:   (!) 90/53 97/66  Pulse: 88  86   Resp: 19  19 18   Temp:   97.6 F (36.4 C) 98.9 F (37.2 C)  TempSrc:   Axillary Oral  SpO2: 97%  98% 96%  Weight:  81.2 kg    Height:        Gen: Pleasant elderly female sitting up in bed, AAOx3 HEENT: No JVD CVS: S1-S2, irregularly irregular Lungs: Decreased breath sounds at the bases Abdomen: Soft, nontender, bowel sounds present Extremities: No edema  Skin: no new rashes on exposed skin    Data Reviewed:   I have personally reviewed following labs and imaging studies   CBC: Recent Labs  Lab 01/24/23 0057 01/26/23 0344 01/27/23 0311  WBC 6.5 6.6 6.4  NEUTROABS  --   --  4.2  HGB 11.3* 11.4* 11.2*  HCT 37.1 37.6 37.2  MCV 94.2 96.7 94.4  PLT 252 270 273    Basic Metabolic Panel: Recent Labs  Lab 01/22/23 0631 01/23/23 0055 01/24/23 0057 01/25/23 0037 01/26/23 0344 01/27/23 0311  NA 137 134* 134* 135 135 134*  K 2.9* 3.9 4.7 4.4 4.4 3.9  CL 87* 90* 93* 94* 94* 91*  CO2 37* 32 30 30 30  32  GLUCOSE 109* 105* 125* 109* 170* 83  BUN 18 18 21  25* 31* 33*  CREATININE  0.86 0.76 1.02* 0.98 1.35* 1.33*  CALCIUM 9.8 10.1 10.1 10.1 10.0 9.7  MG 2.1  --   --   --   --  2.3  PHOS  --   --   --   --   --  4.3    Liver Function Tests: Recent Labs  Lab 01/27/23 0311  AST 40  ALT 32  ALKPHOS 123  BILITOT 0.8  PROT 6.4*  ALBUMIN 3.0*     CBG: No results for input(s): "GLUCAP" in the last 168 hours.   Microbiology Studies:   No results found for this or any previous visit (from the past 240 hour(s)).   Radiology Studies:  DG CHEST PORT 1 VIEW  Result Date: 01/27/2023 CLINICAL DATA:  Shortness of breath EXAM: PORTABLE CHEST 1 VIEW COMPARISON:  Chest radiograph 01/25/2023 FINDINGS: No change from 01/25/2023. Stable cardiomediastinal silhouette. Aortic atherosclerotic calcification. Right lower and perihilar consolidation. Retrocardiac consolidation. Small bilateral pleural effusions. No pneumothorax. IMPRESSION: Unchanged bilateral airspace opacities. Small bilateral pleural effusions. Electronically Signed   By: Minerva Fester M.D.   On: 01/27/2023 01:19   DG Chest 2 View  Result Date: 01/25/2023 CLINICAL DATA:  Hypoxia EXAM: CHEST - 2 VIEW COMPARISON:  Chest x-ray 01/21/2023.  Chest CT 01/20/2023. FINDINGS: Right chest tube is no longer seen. There is no pneumothorax. Parenchymal opacities in the medial right mid and lower lung appear unchanged. Minimal retrocardiac opacities persist. The cardiomediastinal silhouette appears stable. There is a small left pleural effusion, unchanged.  The osseous structures are stable. IMPRESSION: 1. Right chest tube is no longer seen. No pneumothorax. 2. Unchanged parenchymal opacities in the medial right mid and lower lung. Electronically Signed   By: Darliss Cheney M.D.   On: 01/25/2023 17:47    Scheduled Meds:    allopurinol  100 mg Oral Daily   amiodarone  200 mg Oral BID   apixaban  5 mg Oral Q12H   buPROPion  300 mg Oral Daily   calcium carbonate  1 tablet Oral BID WC   Chlorhexidine Gluconate Cloth  6 each  Topical Daily   cholecalciferol  1,000 Units Oral Daily   cyanocobalamin  1,000 mcg Oral Daily   fluticasone  2 spray Each Nare Daily   gabapentin  400 mg Oral Daily   guaiFENesin  600 mg Oral BID   metoprolol succinate  12.5 mg Oral Daily   midodrine  10 mg Oral TID WC   multivitamin with minerals  1 tablet Oral Daily   pantoprazole  40 mg Oral Daily   pneumococcal 20-valent conjugate vaccine  0.5 mL Intramuscular Tomorrow-1000   polyethylene glycol  17 g Oral BID   senna-docusate  2 tablet Oral BID   torsemide  20 mg Oral Daily   traMADol  50 mg Oral BID    Continuous Infusions:    sodium chloride Stopped (01/14/23 0222)     LOS: 23 days     Zannie Cove, MD,   [facility]   01/27/2023, 10:11 AM

## 2023-01-27 NOTE — Progress Notes (Signed)
Pt placed on Bipap due to O2 desaturation and a slight increased work of breathing. Pt tolerating well. Will continue to monitor.

## 2023-01-27 NOTE — Progress Notes (Signed)
Physical Therapy Treatment Patient Details Name: [name] MRN: [id number] DOB: May 08, 1941 Today's Date: 01/27/2023   History of Present Illness 82 y.o. female admitted 7/27 with weakness, SOB, AMS. Pt with Rt pleural effusion, s/p thoracentesis 8/1. 8/2 intubated, bronch and extubated. 8/4 Afib with RVR. 8/8 thoracentesis and chest tube placement. 8/13 chest tube removed PMH: Takotsubo cardiomyopathy, CVA, DVT/PE, Vfib, chronic hypoxemic respiratory failure on 2L PRN , L LE non-healing wound/cellulitis    PT Comments  Pt received in sitting in the recliner and agreeable to session with encouragement. Pt able to stand from recliner with min A, however demonstrates a posterior bias with limited improvement with cues. Pt demonstrates one posterior LOB with an uncontrolled sit back to recliner during first standing trial. Pt able to stand for 2 more trials with focus on anterior weight shift and marching. Once pt begins leaning posteriorly requiring mod A to maintain balance, pt continues to lean against therapist and is unable to correct without a seated rest break. Pt demonstrates improved anterior weight shift during final trial with less physical assist, however requires assist during marching due to increased instability. Pt continues to benefit from PT services to progress toward functional mobility goals.    If plan is discharge home, recommend the following: A little help with walking and/or transfers;A little help with bathing/dressing/bathroom;Assist for transportation;Help with stairs or ramp for entrance;A lot of help with bathing/dressing/bathroom;Assistance with cooking/housework   Can travel by private vehicle     Yes  Equipment Recommendations  BSC/3in1    Recommendations for Other Services       Precautions / Restrictions Precautions Precautions: Fall Precaution Comments: watch SpO2 Restrictions Weight Bearing Restrictions: No     Mobility  Bed Mobility                General bed mobility comments: Pt beginning and ending session in recliner    Transfers Overall transfer level: Needs assistance Equipment used: Rolling walker (2 wheels) Transfers: Sit to/from Stand Sit to Stand: Min assist           General transfer comment: STS from recliner x3 with min A for power up and anterior weight shift due to posterior bias. Pt with one LOB to recliner due to posterior lean despite cues    Ambulation/Gait             Pre-gait activities: marching General Gait Details: Unable due to posterior bias and LOB in standing       Balance Overall balance assessment: Needs assistance Sitting-balance support: No upper extremity supported, Feet supported Sitting balance-Leahy Scale: Fair Sitting balance - Comments: sitting in recliner   Standing balance support: Bilateral upper extremity supported, During functional activity, Reliant on assistive device for balance Standing balance-Leahy Scale: Poor Standing balance comment: with RW support and up to mod A due to posterior lean                            Cognition Arousal: Alert Behavior During Therapy: WFL for tasks assessed/performed Overall Cognitive Status: Within Functional Limits for tasks assessed                                          Exercises General Exercises - Lower Extremity Long Arc Quad: AROM, Both, Seated, 10 reps Hip Flexion/Marching: AROM, Both, 10 reps, Standing (x2)  General Comments        Pertinent Vitals/Pain Pain Assessment Pain Assessment: Faces Faces Pain Scale: Hurts a little bit Pain Location: B knees Pain Descriptors / Indicators: Aching Pain Intervention(s): Monitored during session, Limited activity within patient's tolerance     PT Goals (current goals can now be found in the care plan section) Acute Rehab PT Goals Patient Stated Goal: get better PT Goal Formulation: With patient Time For Goal Achievement:  01/25/23 Potential to Achieve Goals: Good Progress towards PT goals: Progressing toward goals    Frequency    Min 1X/week      PT Plan Current plan remains appropriate       AM-PAC PT "6 Clicks" Mobility   Outcome Measure  Help needed turning from your back to your side while in a flat bed without using bedrails?: A Little Help needed moving from lying on your back to sitting on the side of a flat bed without using bedrails?: A Little Help needed moving to and from a bed to a chair (including a wheelchair)?: A Little Help needed standing up from a chair using your arms (e.g., wheelchair or bedside chair)?: A Little Help needed to walk in hospital room?: A Lot Help needed climbing 3-5 steps with a railing? : Total 6 Click Score: 15    End of Session Equipment Utilized During Treatment: Oxygen;Gait belt Activity Tolerance: Patient limited by fatigue Patient left: in chair;with call bell/phone within reach Nurse Communication: Mobility status PT Visit Diagnosis: Muscle weakness (generalized) (M62.81);Difficulty in walking, not elsewhere classified (R26.2);Other abnormalities of gait and mobility (R26.89)     Time: 1914-7829 PT Time Calculation (min) (ACUTE ONLY): 18 min  Charges:    $Therapeutic Activity: 8-22 mins PT General Charges $$ ACUTE PT VISIT: 1 Visit                     Johny Shock, PTA Acute Rehabilitation Services Secure Chat Preferred  [phone]    Johny Shock 01/27/2023, 2:39 PM

## 2023-01-28 DIAGNOSIS — Z7189 Other specified counseling: Secondary | ICD-10-CM | POA: Diagnosis not present

## 2023-01-28 DIAGNOSIS — Z515 Encounter for palliative care: Secondary | ICD-10-CM | POA: Diagnosis not present

## 2023-01-28 LAB — BASIC METABOLIC PANEL
Anion gap: 11 (ref 5–15)
BUN: 32 mg/dL — ABNORMAL HIGH (ref 8–23)
CO2: 33 mmol/L — ABNORMAL HIGH (ref 22–32)
Calcium: 9.4 mg/dL (ref 8.9–10.3)
Chloride: 92 mmol/L — ABNORMAL LOW (ref 98–111)
Creatinine, Ser: 1.42 mg/dL — ABNORMAL HIGH (ref 0.44–1.00)
GFR, Estimated: 37 mL/min — ABNORMAL LOW (ref >60)
Glucose, Bld: 100 mg/dL — ABNORMAL HIGH (ref 70–99)
Potassium: 3.6 mmol/L (ref 3.5–5.1)
Sodium: 136 mmol/L (ref 135–145)

## 2023-01-28 LAB — CBC
HCT: 36.9 % (ref 36.0–46.0)
Hemoglobin: 11.3 g/dL — ABNORMAL LOW (ref 12.0–15.0)
MCH: 29.3 pg (ref 26.0–34.0)
MCHC: 30.6 g/dL (ref 30.0–36.0)
MCV: 95.6 fL (ref 80.0–100.0)
Platelets: 264 10*3/uL (ref 150–400)
RBC: 3.86 MIL/uL — ABNORMAL LOW (ref 3.87–5.11)
RDW: 15.4 % (ref 11.5–15.5)
WBC: 6.7 10*3/uL (ref 4.0–10.5)
nRBC: 0.3 % — ABNORMAL HIGH (ref 0.0–0.2)

## 2023-01-28 NOTE — Progress Notes (Addendum)
PROGRESS NOTE   [name]  KGM:[id number]    DOB: Oct 15, 1940    DOA: 01/04/2023  PCP: Herma Carson, MD   Brief Narrative:  82 year old female, PMH of A-fib, DVT/PE, Takotsubo cardiomyopathy, V-fib, chronic systolic CHF, CVA with residual deficits, presented 01/04/2023 with complaints of weakness, dyspnea, lower extremity edema and encephalopathy.  Admitted for acute on chronic hypoxic and hypercapnic respiratory failure, RLL CAP complicated by parapneumonic effusion requiring chest tube, acute on chronic combined CHF, A-fib with RVR and left leg cellulitis.  She had been transferred to ICU under PCCM care.   Chest x-ray 01/09/2023-persistent atelectasis right lung Ultrasound chest 01/08/2023-right effusion, consolidation CT scan of the chest 01/09/2023-complete atelectasis right lung, consolidation left base Thoracentesis 8/1, 1.1 L removed, stopped due to chest discomfort Intubation for bronchoscopy 8/2 with right-sided secretions removed.  Extubated postprocedure 8/5 recurrent total right atelectasis, increased respiratory needs >> BiPAP 8/7 off pressors 8/8 Rt pig tail inserted Code status: DNR Palliative consulted and following 8/14 Transferred back to [facility] course continues to be complicated by hypoxia, pulmonary toileting issues and volume  Subjective: feels better, O2 sats all over the place   Assessment & Plan:   Acute on chronic hypoxic, hypercapnic respiratory failure. CAP RLL complicated by parapneumonic effusion Acute on chronic combined CHF. Severe MR Initially RX w/ Diuretics, Foll by bronchoscopy for pulm toilet for white out R lung, then extubated after bronch 8/87foll by intermittent BIPAP -foll by right-sided chest tube, removed 8/13 -Needing BiPAP at night and [place] oxygen during daytime.  Wean O2 down as tolerated. -Continue aggressive pulmonary toilet  -Completed course of antibiotics -Palliative care input 8/13 appreciated.  DNR DNI confirmed.  Ongoing  conversations regarding GOC. -Increased oxygen requirements 8/18, repeat x-ray with small pleural effusions and some persistent infiltrates, do not suspect new infection, continue torsemide, encouraged incentive spirometry, flutter valve, out of bed to chair -Her finger O2 sats are unreliable, has very poor peripheral circulation  Acute on chronic combined CHF: Severe MR, TR Improving on IV Lasix. -Last echo this admission with a EF of 55%, mildly reduced RV, severe mitral regurgitation, previously seen by cardiology, now off -8.6 L negative, weight down 20 LB -Clinically appears euvolemic, transitioned to oral torsemide with low albumin, continue same -Follow-up with cardiology, outpatient palliative care as well  Hypokalemia: -Replaced  Atrial fibrillation with RVR. Continue amiodarone 200 Mg daily. Remains with mild RVR in the 100s-110s. -Cards following, was limited by hypotension, on midodrine Continue apixaban. -Restarted on Toprol few days ago, blood pressure soft but stable, limited response to digoxin earlier  Hypotension: Continue midodrine 10 Mg 3 times daily. Asymptomatic. 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  Outpatient follow-up with cardiology.  HLD. Currently not on meds.  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  Lt leg cellulitis Completed course of antibiotics.  Hx gout, depression, constipation, peripheral neuropathy continue PTA wellbutrin, allopurinol, neurontin, tramadol  Body mass index is 32.74 kg/m. Obesity/complicates care.  Outpatient follow-up.  Nutritional Status Nutrition Problem: Increased nutrient needs Etiology: wound healing Signs/Symptoms: estimated needs Interventions: Boost Breeze, MVI, Liberalize Diet  Pressure Ulcer: Pressure Injury 01/04/23 Pretibial Left Stage 2 -  Partial thickness loss of dermis presenting as a shallow open injury with a red, pink wound bed without slough. (Active)  01/04/23 2200   Location: Pretibial  Location Orientation: Left  Staging: Stage 2 -  Partial thickness loss of dermis presenting as a shallow open injury with a red, pink wound bed  without slough.  Wound Description (Comments):   Present on Admission: Yes  Dressing Type Silver hydrofiber;ABD 01/27/23 2000    DVT prophylaxis:   Full dose apixaban   Code Status: DNR:  Family Communication: None at bedside Disposition: SNF tomorrow if stable     Consultants:   PCCM Cardiology Palliative care medicine  Procedures:   Chest x-ray 01/09/2023-persistent atelectasis right lung Ultrasound chest 01/08/2023-right effusion, consolidation CT scan of the chest 01/09/2023-complete atelectasis right lung, consolidation left base Thoracentesis 8/1, 1.1 L removed, stopped due to chest discomfort Intubation for bronchoscopy 8/2 with right-sided secretions removed.  Extubated postprocedure 8/5 recurrent total right atelectasis, increased respiratory needs >> BiPAP 8/7 off pressors 8/8 Rt pig tail inserted 8/13 right pigtail chest tube removed. Ongoing nightly BiPAP and [place] oxygen during the daytime.  Antimicrobials:   Completed course  Objective:   Vitals:   01/28/23 0810 01/28/23 0915 01/28/23 1108 01/28/23 1145  BP: (!) 97/57 98/82 (!) 82/52   Pulse: 88 95 90 93  Resp: 16 16 18 17   Temp: (!) 96.8 F (36 C) 98.2 F (36.8 C) 97.8 F (36.6 C)   TempSrc: Axillary Oral Oral   SpO2: 100% (!) 87% 91% 95%  Weight:      Height:        Gen: Pleasant elderly female sitting up in bed, AAOx3 HEENT: No JVD CVS: S1-S2, irregularly irregular Lungs: Decreased breath sounds at the bases Abdomen: Soft, nontender, bowel sounds present Extremities: No edema  Skin: no new rashes on exposed skin    Data Reviewed:   I have personally reviewed following labs and imaging studies   CBC: Recent Labs  Lab 01/26/23 0344 01/27/23 0311 01/28/23 0430  WBC 6.6 6.4 6.7  NEUTROABS  --  4.2  --   HGB 11.4* 11.2* 11.3*   HCT 37.6 37.2 36.9  MCV 96.7 94.4 95.6  PLT 270 273 264    Basic Metabolic Panel: Recent Labs  Lab 01/22/23 0631 01/23/23 0055 01/24/23 0057 01/25/23 0037 01/26/23 0344 01/27/23 0311 01/28/23 0430  NA 137   < > 134* 135 135 134* 136  K 2.9*   < > 4.7 4.4 4.4 3.9 3.6  CL 87*   < > 93* 94* 94* 91* 92*  CO2 37*   < > 30 30 30  32 33*  GLUCOSE 109*   < > 125* 109* 170* 83 100*  BUN 18   < > 21 25* 31* 33* 32*  CREATININE 0.86   < > 1.02* 0.98 1.35* 1.33* 1.42*  CALCIUM 9.8   < > 10.1 10.1 10.0 9.7 9.4  MG 2.1  --   --   --   --  2.3  --   PHOS  --   --   --   --   --  4.3  --    < > = values in this interval not displayed.    Liver Function Tests: Recent Labs  Lab 01/27/23 0311  AST 40  ALT 32  ALKPHOS 123  BILITOT 0.8  PROT 6.4*  ALBUMIN 3.0*     CBG: No results for input(s): "GLUCAP" in the last 168 hours.   Microbiology Studies:   No results found for this or any previous visit (from the past 240 hour(s)).   Radiology Studies:  DG CHEST PORT 1 VIEW  Result Date: 01/27/2023 CLINICAL DATA:  Shortness of breath EXAM: PORTABLE CHEST 1 VIEW COMPARISON:  Chest radiograph 01/25/2023 FINDINGS: No change from 01/25/2023. Stable cardiomediastinal  silhouette. Aortic atherosclerotic calcification. Right lower and perihilar consolidation. Retrocardiac consolidation. Small bilateral pleural effusions. No pneumothorax. IMPRESSION: Unchanged bilateral airspace opacities. Small bilateral pleural effusions. Electronically Signed   By: Minerva Fester M.D.   On: 01/27/2023 01:19    Scheduled Meds:    allopurinol  100 mg Oral Daily   amiodarone  200 mg Oral BID   apixaban  5 mg Oral Q12H   buPROPion  300 mg Oral Daily   calcium carbonate  1 tablet Oral BID WC   Chlorhexidine Gluconate Cloth  6 each Topical Daily   cholecalciferol  1,000 Units Oral Daily   cyanocobalamin  1,000 mcg Oral Daily   fluticasone  2 spray Each Nare Daily   gabapentin  400 mg Oral Daily    guaiFENesin  600 mg Oral BID   metoprolol succinate  12.5 mg Oral Daily   midodrine  10 mg Oral TID WC   multivitamin with minerals  1 tablet Oral Daily   pantoprazole  40 mg Oral Daily   pneumococcal 20-valent conjugate vaccine  0.5 mL Intramuscular Tomorrow-1000   polyethylene glycol  17 g Oral BID   senna-docusate  2 tablet Oral BID   torsemide  20 mg Oral Daily   traMADol  50 mg Oral BID    Continuous Infusions:    sodium chloride Stopped (01/14/23 0222)     LOS: 24 days     Zannie Cove, MD,   [facility]   01/28/2023, 11:59 AM

## 2023-01-28 NOTE — Progress Notes (Signed)
RT at bedside to assess pt. RT found pt on 4L [place] w/BiPAP on stby at bedside.

## 2023-01-28 NOTE — Progress Notes (Addendum)
Per Child psychotherapist, BiPAP settings needed. Set rate 10 FiO2 40% IPAP 10 EPAP 6

## 2023-01-28 NOTE — Progress Notes (Signed)
 [facility] Liaison Note  Notified by [facility] manager of patient/family request for [facility] palliative services at home after discharge.   [facility] hospital liaison will follow patient for discharge disposition.   Please call with any hospice or outpatient palliative care related questions.   Thank you for the opportunity to participate in this patient's care.   Dionicio Stall, [facility] Liaison [phone]

## 2023-01-28 NOTE — Progress Notes (Signed)
RT at bedside to assess pt. Pt still on BiPAP at this time.

## 2023-01-28 NOTE — Progress Notes (Signed)
Occupational Therapy Treatment Patient Details Name: [name] MRN: [id number] DOB: April 14, 1941 Today's Date: 01/28/2023   History of present illness 82 y.o. female admitted 7/27 with weakness, SOB, AMS. Pt with Rt pleural effusion, s/p thoracentesis 8/1. 8/2 intubated, bronch and extubated. 8/4 Afib with RVR. 8/8 thoracentesis and chest tube placement. 8/13 chest tube removed PMH: Takotsubo cardiomyopathy, CVA, DVT/PE, Vfib, chronic hypoxemic respiratory failure on 2L PRN , L LE non-healing wound/cellulitis   OT comments  Patient supine in bed upon arrival into room and patient completed supine to sit EOB with HOB elevated and CGA.  Patient completed sit to stand from EOB with  min A and completed bed to bedside chair transfer CGA and increased time use RW.  Patient completed face hygiene with Supervision at seated level.  Patient required mod A for donning clean underwear.  Patient left sitting in bedside chair transfer with all needs within reach.      If plan is discharge home, recommend the following:  A lot of help with walking and/or transfers;A lot of help with bathing/dressing/bathroom;Assistance with cooking/housework;Help with stairs or ramp for entrance;Assist for transportation   Equipment Recommendations       Recommendations for Other Services      Precautions / Restrictions Precautions Precautions: Fall Precaution Comments: watch SpO2 Restrictions Weight Bearing Restrictions: No       Mobility Bed Mobility Overal bed mobility: Needs Assistance Bed Mobility: Supine to Sit     Supine to sit: HOB elevated, Contact guard          Transfers Overall transfer level: Needs assistance Equipment used: Rolling walker (2 wheels) Transfers: Sit to/from Stand Sit to Stand: Min assist     Step pivot transfers: Contact guard assist           Balance Overall balance assessment: Needs assistance Sitting-balance support: No upper extremity supported, Feet  supported       Standing balance support: Bilateral upper extremity supported, Reliant on assistive device for balance                               ADL either performed or assessed with clinical judgement   ADL Overall ADL's : Needs assistance/impaired     Grooming: Wash/dry face;Set up;Sitting   Upper Body Bathing: Set up;Sitting       Upper Body Dressing : Set up;Sitting                   Functional mobility during ADLs: Contact guard assist;Rolling walker (2 wheels)      Extremity/Trunk Assessment Upper Extremity Assessment Upper Extremity Assessment: Generalized weakness RUE Deficits / Details: Decr shoulder ROM (prior shoulder surgery) to ~40 degrees            Vision   Vision Assessment?: No apparent visual deficits   Perception     Praxis      Cognition Arousal: Alert Behavior During Therapy: WFL for tasks assessed/performed Overall Cognitive Status: Within Functional Limits for tasks assessed                                          Exercises      Shoulder Instructions       General Comments      Pertinent Vitals/ Pain       Pain Assessment Pain Assessment: No/denies  pain Pain Score: 0-No pain  Home Living                                          Prior Functioning/Environment              Frequency  Min 1X/week        Progress Toward Goals  OT Goals(current goals can now be found in the care plan section)  Progress towards OT goals: Progressing toward goals  Acute Rehab OT Goals OT Goal Formulation: With patient Time For Goal Achievement: 01/31/23 Potential to Achieve Goals: Good ADL Goals Pt Will Perform Grooming: with supervision;standing Pt Will Perform Upper Body Dressing: with modified independence;sitting Pt Will Perform Lower Body Dressing: with supervision;sit to/from stand Pt Will Transfer to Toilet: with supervision;ambulating;regular height  toilet Additional ADL Goal #1: Pt will demonstrate increased activity tolerance as demonstrated by performance of ADL in standing >5 minutes without need for rest breaks.  Plan      Co-evaluation                 AM-PAC OT "6 Clicks" Daily Activity     Outcome Measure   Help from another person eating meals?: A Little Help from another person taking care of personal grooming?: A Little Help from another person toileting, which includes using toliet, bedpan, or urinal?: A Lot Help from another person bathing (including washing, rinsing, drying)?: A Lot Help from another person to put on and taking off regular upper body clothing?: A Little Help from another person to put on and taking off regular lower body clothing?: A Lot 6 Click Score: 15    End of Session Equipment Utilized During Treatment: Gait belt;Rolling walker (2 wheels)  OT Visit Diagnosis: Unsteadiness on feet (R26.81);Muscle weakness (generalized) (M62.81)   Activity Tolerance Patient tolerated treatment well   Patient Left in chair;with call bell/phone within reach;with chair alarm set   Nurse Communication Mobility status        Time: 1000-1035 OT Time Calculation (min): 35 min  Charges: OT General Charges $OT Visit: 1 Visit OT Treatments $Self Care/Home Management : 23-37 mins Governor Specking OT/L  Denice Paradise 01/28/2023, 1:13 PM

## 2023-01-28 NOTE — Progress Notes (Signed)
Mobility Specialist Progress Note:   01/28/23 1506  Mobility  Activity Ambulated with assistance in room  Level of Assistance Minimal assist, patient does 75% or more  Assistive Device Front wheel walker  Distance Ambulated (ft) 15 ft  Activity Response Tolerated well  Mobility Referral Yes  $Mobility charge 1 Mobility  Mobility Specialist Start Time (ACUTE ONLY) 1440  Mobility Specialist Stop Time (ACUTE ONLY) 1500  Mobility Specialist Time Calculation (min) (ACUTE ONLY) 20 min   Pre Mobility: 104 HR , 107/47 BP , 91% SpO2 4 L During Mobility: 124 HR  Post Mobility: 105 HR , 95% SpO2 4 L  Pt received in chair, agreeable to mobility. MinA to stand. CG during ambulation. Pt was able to ambulate in room without rest break. C/o legs feeling slightly weak otherwise asymptomatic throughout. Unable to get accurate SpO2 reading during ambulation. Pt denied any SOB during session, asymptomatic on 4 L. Pt left in chair with call bell in reach and all needs met.   Leory Plowman  Mobility Specialist Please contact via [employer] office at [phone]

## 2023-01-28 NOTE — Progress Notes (Signed)
   01/27/23 2327  BiPAP/CPAP/SIPAP  BiPAP/CPAP/SIPAP Pt Type Adult  BiPAP/CPAP/SIPAP SERVO  Mask Type Full face mask  Mask Size Medium  Set Rate 10 breaths/min  Respiratory Rate 22 breaths/min  IPAP 10 cmH20 (PC 10 or inspiratory pressire 10cmh2o)  Minute Ventilation 10.4  Leak 42  Peak Inspiratory Pressure (PIP) 16  Tidal Volume (Vt) 489  Patient Home Equipment No  Auto Titrate No  Press High Alarm 25 cmH2O  Press Low Alarm 5 cmH2O  BiPAP/CPAP /SiPAP Vitals  Pulse Rate 85  BP 91/60  SpO2 96 %  Bilateral Breath Sounds Diminished

## 2023-01-28 NOTE — TOC Progression Note (Addendum)
Transition of Care [facility]) - Progression Note    Patient Details  Name: [name] MRN: [id number] Date of Birth: 02-14-1941  [facility] CM/SW Contact  Delilah Shan, LCSWA Phone Number: 01/28/2023, 1:02 PM  Clinical Narrative:      Update- CSW received call back from [place] with [facility] who informed CSW will need order from MD for Bipap. Will need to say when patient needs to wear Bipap with the settings. CSW informed MD.   Update- MD informed CSW that patient will need Bipap at SNF. CSW informed Logan with [employer]. Logan request for note be put in with bipap settings. CSW informed Katie with Respiratory. Katie confirmed she will put in note with patients Bipap settings. CSW informed Whitney Post that note should be in shortly.   CSW started insurance authorization for patient. Auth ID# [id number]. Patient has SNF bed at [facility].CSW received consult for outpatient palliative referral for patient. CSW spoke with patient at bedside. Patient request for CSW to call her daughter [name]. CSW spoke with patients daughter [name]. CSW updated patients daughter [name] on status of SNF placement. [name] gave CSW permission to make referral to [facility] for palliative services to follow patient at snf. CSW made referral to [facility] with [facility] for palliative services to follow patient at snf.CSW will continue to follow.   Expected Discharge Plan: Skilled Nursing Facility Barriers to Discharge: Continued Medical Work up, SNF Pending bed offer  Expected Discharge Plan and Services In-house Referral: Clinical Social Work   Post Acute Care Choice: Skilled Nursing Facility Living arrangements for the past 2 months: Single Family Home                                       Social Determinants of Health (SDOH) Interventions SDOH Screenings   Food Insecurity: No Food Insecurity (01/24/2023)  Housing: Low Risk  (01/24/2023)  Transportation Needs: No Transportation Needs (01/24/2023)   Utilities: Not At Risk (01/11/2023)  Alcohol Screen: Low Risk  (01/24/2023)  Financial Resource Strain: Low Risk  (01/24/2023)  Tobacco Use: Medium Risk (01/04/2023)    Readmission Risk Interventions     No data to display

## 2023-01-28 NOTE — Plan of Care (Signed)

## 2023-01-29 DIAGNOSIS — J9622 Acute and chronic respiratory failure with hypercapnia: Secondary | ICD-10-CM | POA: Diagnosis not present

## 2023-01-29 DIAGNOSIS — J9621 Acute and chronic respiratory failure with hypoxia: Secondary | ICD-10-CM | POA: Diagnosis not present

## 2023-01-29 LAB — BASIC METABOLIC PANEL
Anion gap: 12 (ref 5–15)
BUN: 29 mg/dL — ABNORMAL HIGH (ref 8–23)
CO2: 33 mmol/L — ABNORMAL HIGH (ref 22–32)
Calcium: 9.5 mg/dL (ref 8.9–10.3)
Chloride: 92 mmol/L — ABNORMAL LOW (ref 98–111)
Creatinine, Ser: 1.34 mg/dL — ABNORMAL HIGH (ref 0.44–1.00)
GFR, Estimated: 40 mL/min — ABNORMAL LOW (ref >60)
Glucose, Bld: 104 mg/dL — ABNORMAL HIGH (ref 70–99)
Potassium: 3.9 mmol/L (ref 3.5–5.1)
Sodium: 137 mmol/L (ref 135–145)

## 2023-01-29 MED ORDER — SENNOSIDES-DOCUSATE SODIUM 8.6-50 MG PO TABS: 2.0000 | ORAL_TABLET | Freq: Every day | ORAL | Status: DC

## 2023-01-29 MED ORDER — GABAPENTIN 300 MG PO CAPS: 300.0000 mg | ORAL_CAPSULE | Freq: Every day | ORAL | Status: DC

## 2023-01-29 MED ORDER — TRAMADOL HCL 50 MG PO TABS: 50.0000 mg | ORAL_TABLET | Freq: Two times a day (BID) | ORAL | 0 refills | Status: DC

## 2023-01-29 MED ORDER — TORSEMIDE 20 MG PO TABS: 20.0000 mg | ORAL_TABLET | Freq: Every day | ORAL | Status: DC

## 2023-01-29 MED ORDER — AMIODARONE HCL 200 MG PO TABS: 200.0000 mg | ORAL_TABLET | Freq: Two times a day (BID) | ORAL | Status: DC

## 2023-01-29 MED ORDER — POLYETHYLENE GLYCOL 3350 17 G PO PACK: 17.0000 g | PACK | Freq: Every day | ORAL | Status: DC | PRN

## 2023-01-29 MED ORDER — POTASSIUM CHLORIDE CRYS ER 20 MEQ PO TBCR: 40.0000 meq | EXTENDED_RELEASE_TABLET | Freq: Every day | ORAL | Status: DC

## 2023-01-29 MED ORDER — MIDODRINE HCL 10 MG PO TABS: 10.0000 mg | ORAL_TABLET | Freq: Three times a day (TID) | ORAL | Status: DC

## 2023-01-29 MED ORDER — METOPROLOL SUCCINATE ER 25 MG PO TB24: 12.5000 mg | ORAL_TABLET | Freq: Every day | ORAL | Status: DC

## 2023-01-29 NOTE — Care Management Important Message (Signed)
Important Message  Patient Details  Name: [name] MRN: [id number] Date of Birth: Feb 18, 1941   Medicare Important Message Given:  Yes     Renie Ora 01/29/2023, 11:48 AM

## 2023-01-29 NOTE — TOC Progression Note (Addendum)
Transition of Care [facility]) - Progression Note    Patient Details  Name: [name] MRN: [id number] Date of Birth: 15-Dec-1940  Transition of Care [facility]) CM/SW Contact  Eduard Roux, [place] Phone Number: 01/29/2023, 2:12 PM  Clinical Narrative:     Sent message -Still waiting on confirmation [facility] SNF has bipap.  Expected Discharge Plan: Skilled Nursing Facility Barriers to Discharge: Continued Medical Work up, SNF Pending bed offer  Expected Discharge Plan and Services In-house Referral: Clinical Social Work   Post Acute Care Choice: Skilled Nursing Facility Living arrangements for the past 2 months: Single Family Home Expected Discharge Date: 01/29/23                                     Social Determinants of Health (SDOH) Interventions SDOH Screenings   Food Insecurity: No Food Insecurity (01/24/2023)  Housing: Low Risk  (01/24/2023)  Transportation Needs: No Transportation Needs (01/24/2023)  Utilities: Not At Risk (01/11/2023)  Alcohol Screen: Low Risk  (01/24/2023)  Financial Resource Strain: Low Risk  (01/24/2023)  Tobacco Use: Medium Risk (01/04/2023)    Readmission Risk Interventions     No data to display

## 2023-01-29 NOTE — TOC Progression Note (Signed)
Transition of Care [facility]) - Progression Note    Patient Details  Name: [name] MRN: [id number] Date of Birth: [birth year]  Transition of [facility]) CM/SW Contact  Eduard Roux, [place] Phone Number: 01/29/2023, 4:16 PM  Clinical Narrative:     [facility]- reports they do not have bi-pap and should have it tomorrow.  Antony Blackbird, MSW, LCSW Clinical Social Worker    Expected Discharge Plan: Skilled Nursing Facility Barriers to Discharge: Continued Medical Work up, SNF Pending bed offer  Expected Discharge Plan and Services In-house Referral: Clinical Social Work   Post Acute Care Choice: Skilled Nursing Facility Living arrangements for the past 2 months: Single Family Home Expected Discharge Date: 01/29/23                                     Social Determinants of Health (SDOH) Interventions SDOH Screenings   Food Insecurity: No Food Insecurity (01/24/2023)  Housing: Low Risk  (01/24/2023)  Transportation Needs: No Transportation Needs (01/24/2023)  Utilities: Not At Risk (01/11/2023)  Alcohol Screen: Low Risk  (01/24/2023)  Financial Resource Strain: Low Risk  (01/24/2023)  Tobacco Use: Medium Risk (01/04/2023)    Readmission Risk Interventions     No data to display

## 2023-01-29 NOTE — Progress Notes (Signed)
   Palliative Medicine Inpatient Follow Up Note   Chart reviewed.  Plan for transfer to [facility] SNF with OP Palliative support.  Goals defined at this time.   No charge ______________________________________________________________________________________ Lamarr Lulas [place] Palliative Medicine Team Team Cell Phone: [phone] Please utilize secure chat with additional questions, if there is no response within 30 minutes please call the above phone number  Palliative Medicine Team providers are available by phone from 7am to 7pm daily and can be reached through the team cell phone.  Should this patient require assistance outside of these hours, please call the patient's attending physician.

## 2023-01-29 NOTE — Progress Notes (Addendum)
RT at bedside to assess pt. RT found pt on 5L [place] with BiPAP on stby at bedside.

## 2023-01-29 NOTE — Discharge Summary (Addendum)
Physician Discharge Summary  [name] ZOX:[id number] DOB: 04-15-1941 DOA: 01/04/2023  PCP: Herma Carson, MD  Admit date: 01/04/2023 Discharge date: 01/29/2023  Time spent:45 minutes  Recommendations for Outpatient Follow-up:  SNF for short-term rehab Outpatient palliative care Heart failure TOC clinic in 1 week Cardiology at [facility] in 1 month FYI Her finger pulse ox/O2 sats are extremely unreliable, has very poor peripheral circulation, forehead better option but takes time Continue nightly BiPAP   Discharge Diagnoses:  Principal Problem:   Acute on chronic respiratory failure with hypoxia and hypercapnia (HCC) Prolonged respiratory failure Acute on chronic diastolic CHF Severe mitral regurgitation Severe tricuspid regurgitation   CAP (community acquired pneumonia)   Atrial fibrillation with rapid ventricular response (HCC)   Hypotension   Cellulitis   Acute congestive heart failure (HCC)   Acute respiratory failure with hypoxia and hypercapnia (HCC)   Aspiration into airway   Pressure injury of skin   Pleural effusion, left   Pleural effusion   Discharge Condition: Stable  Diet recommendation: Low so DM, heart healthy  Filed Weights   01/27/23 0415 01/28/23 0600 01/29/23 0433  Weight: 81.2 kg 81.2 kg 81.5 kg    History of present illness:  82 year old female, PMH of A-fib, DVT/PE, Takotsubo cardiomyopathy, V-fib, chronic systolic CHF, CVA with residual deficits, presented 01/04/2023 with complaints of weakness, dyspnea, lower extremity edema and encephalopathy.  Admitted for acute on chronic hypoxic and hypercapnic respiratory failure, RLL CAP complicated by parapneumonic effusion requiring chest tube, acute on chronic combined CHF, A-fib with RVR and left leg cellulitis.  She had been transferred to ICU under PCCM care.   Chest x-ray 01/09/2023-persistent atelectasis right lung Ultrasound chest 01/08/2023-right effusion, consolidation CT scan of the  chest 01/09/2023-complete atelectasis right lung, consolidation left base Thoracentesis 8/1, 1.1 L removed, stopped due to chest discomfort Intubation for bronchoscopy 8/2 with right-sided secretions removed.  Extubated postprocedure 8/5 recurrent total right atelectasis, increased respiratory needs >> BiPAP 8/7 off pressors 8/8 Rt pig tail inserted Code status: DNR Palliative consulted and following 8/14 Transferred back to [facility] course continues to be complicated by hypoxia, pulmonary toileting issues and volume  Hospital Course:   Acute on chronic hypoxic, hypercapnic respiratory failure. CAP RLL complicated by parapneumonic effusion Acute on chronic combined CHF. Initially RX w/ Diuretics, Foll by bronchoscopy for pulm toilet for white out R lung, then extubated after bronch 8/2 foll by intermittent BIPAP -foll by right-sided chest tube, removed 8/13 -Needing BiPAP at night and [place] oxygen during daytime.  Weaned down to 3 L nasal cannula during the day -Completed course of antibiotics -Palliative care input 8/13 appreciated.  DNR DNI confirmed.  Ongoing conversations regarding GOC.  Now plan for outpatient palliative care follow-up -Her finger O2 sats are unreliable, has very poor peripheral circulation   Acute on chronic combined CHF: Severe MR, TR Improving on IV Lasix. -Last echo this admission with a EF of 55%, mildly reduced RV, severe mitral regurgitation, previously seen by cardiology, now off -8.6 L negative, weight down 20 LB -Clinically appears euvolemic, transitioned to oral torsemide with low albumin, low dose Toprol resumed for A-fib RVR -GDMT limited by hypotension, has required midodrine through this admission -Follow-up with cardiology, outpatient palliative care as well for severe valvular heart disease   Hypokalemia: -Replaced   Atrial fibrillation with RVR. Continue amiodarone 200 Mg daily. -Was followed by cardiology, was limited by hypotension, on  midodrine Continue apixaban. -Restarted on Toprol few days ago, blood pressure soft  but stable, limited response to digoxin earlier  COPD, former smoker   Hypotension: Continue midodrine 10 Mg 3 times daily. Asymptomatic. -BP fluctuates,   HLD. Currently not on meds.   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   Lt leg cellulitis Completed course of antibiotics.   Hx gout, depression, constipation, peripheral neuropathy continue PTA wellbutrin, allopurinol, neurontin, tramadol   Body mass index is 32.74 kg/m. Obesity/complicates care.  Outpatient follow-up.   Nutritional Status Nutrition Problem: Increased nutrient needs Etiology: wound healing Signs/Symptoms: estimated needs Interventions: Boost Breeze, MVI, Liberalize Diet   Pressure Ulcer:     Pressure Injury 01/04/23 Pretibial Left Stage 2 -  Partial thickness loss of dermis presenting as a shallow open injury with a red, pink wound bed without slough. (Active)  01/04/23 2200  Location: Pretibial  Location Orientation: Left  Staging: Stage 2 -  Partial thickness loss of dermis presenting as a shallow open injury with a red, pink wound bed without slough.  Wound Description (Comments):   Present on Admission: Yes  Dressing Type Silver hydrofiber;ABD 01/27/23 2000     Discharge Exam: Vitals:   01/29/23 0433 01/29/23 0709  BP: (!) 110/56 (!) 99/42  Pulse:  84  Resp: 18 19  Temp: 98.1 F (36.7 C) 97.9 F (36.6 C)  SpO2:  99%    Gen: Pleasant elderly female sitting up in bed, AAOx3 HEENT: No JVD CVS: S1-S2, irregularly irregular Lungs: Decreased breath sounds at the bases Abdomen: Soft, nontender, bowel sounds present Extremities: No edema  Skin: no new rashes on exposed skin     Discharge Instructions    Allergies as of 01/29/2023   No Known Allergies      Medication List     STOP taking these medications    bumetanide 2 MG tablet Commonly known as: BUMEX   digoxin 0.125 MG tablet Commonly  known as: LANOXIN   methylPREDNISolone 4 MG Tbpk tablet Commonly known as: MEDROL DOSEPAK   metoprolol tartrate 25 MG tablet Commonly known as: LOPRESSOR       TAKE these medications    allopurinol 100 MG tablet Commonly known as: ZYLOPRIM Take 100 mg by mouth daily.   amiodarone 200 MG tablet Commonly known as: PACERONE Take 1 tablet (200 mg total) by mouth 2 (two) times daily. Take 200 mg twice daily for 2 weeks and then 200 mg daily   aspirin 81 MG tablet Take 81 mg by mouth daily.   buPROPion 300 MG 24 hr tablet Commonly known as: WELLBUTRIN XL Take 300 mg by mouth daily.   calcium-vitamin D 500-200 MG-UNIT tablet Commonly known as: OSCAL WITH D Take 1 tablet by mouth 2 (two) times daily.   cyanocobalamin 1000 MCG tablet Commonly known as: VITAMIN B12 Take 1,000 mcg by mouth daily.   Eliquis 5 MG Tabs tablet Generic drug: apixaban Take 5 mg by mouth 2 (two) times daily.   gabapentin 300 MG capsule Commonly known as: NEURONTIN Take 1 capsule (300 mg total) by mouth at bedtime. What changed:  how much to take when to take this   metoprolol succinate 25 MG 24 hr tablet Commonly known as: TOPROL-XL Take 0.5 tablets (12.5 mg total) by mouth daily. Start taking on: January 30, 2023   midodrine 10 MG tablet Commonly known as: PROAMATINE Take 1 tablet (10 mg total) by mouth 3 (three) times daily with meals.   pantoprazole 40 MG tablet Commonly known as: PROTONIX Take 40 mg by mouth daily.   polyethylene glycol  17 g packet Commonly known as: MIRALAX / GLYCOLAX Take 17 g by mouth daily as needed.   potassium chloride SA 20 MEQ tablet Commonly known as: KLOR-CON M Take 2 tablets (40 mEq total) by mouth daily.   rosuvastatin 5 MG tablet Commonly known as: CRESTOR Take 5 mg by mouth at bedtime.   senna-docusate 8.6-50 MG tablet Commonly known as: Senokot-S Take 2 tablets by mouth at bedtime.   torsemide 20 MG tablet Commonly known as: DEMADEX Take 1  tablet (20 mg total) by mouth daily. Start taking on: January 30, 2023   traMADol 50 MG tablet Commonly known as: ULTRAM Take 50 mg by mouth 2 (two) times daily. For moderate pain   triamcinolone ointment 0.1 % Commonly known as: KENALOG Apply 1 Application topically as needed (As directed).               Durable Medical Equipment  (From admission, onward)           Start     Ordered   01/28/23 1620  For home use only DME Bipap  Once       Question Answer Comment  Length of Need 6 Months   Bleed in oxygen (LPM) 3   Inspiratory pressure 10   Expiratory pressure 5      01/28/23 1620           No Known Allergies  Follow-up Information     [facility] Follow up.   Why: for home health services        [facility]. Go in 8 day(s).   Specialty: Cardiology Why: Hospital follow up 02/04/2023 @ 11 am PLEASE bring a cuurent medication list to appointment FREE valet parking, Entrance C, Off [employer] information: [address] [place] [address] [phone]                 The results of significant diagnostics from this hospitalization (including imaging, microbiology, ancillary and laboratory) are listed below for reference.    Significant Diagnostic Studies: DG CHEST PORT 1 VIEW  Result Date: 01/27/2023 CLINICAL DATA:  Shortness of breath EXAM: PORTABLE CHEST 1 VIEW COMPARISON:  Chest radiograph 01/25/2023 FINDINGS: No change from 01/25/2023. Stable cardiomediastinal silhouette. Aortic atherosclerotic calcification. Right lower and perihilar consolidation. Retrocardiac consolidation. Small bilateral pleural effusions. No pneumothorax. IMPRESSION: Unchanged bilateral airspace opacities. Small bilateral pleural effusions. Electronically Signed   By: Minerva Fester M.D.   On: 01/27/2023 01:19   DG Chest 2 View  Result Date: 01/25/2023 CLINICAL DATA:  Hypoxia EXAM:  CHEST - 2 VIEW COMPARISON:  Chest x-ray 01/21/2023.  Chest CT 01/20/2023. FINDINGS: Right chest tube is no longer seen. There is no pneumothorax. Parenchymal opacities in the medial right mid and lower lung appear unchanged. Minimal retrocardiac opacities persist. The cardiomediastinal silhouette appears stable. There is a small left pleural effusion, unchanged. The osseous structures are stable. IMPRESSION: 1. Right chest tube is no longer seen. No pneumothorax. 2. Unchanged parenchymal opacities in the medial right mid and lower lung. Electronically Signed   By: Darliss Cheney M.D.   On: 01/25/2023 17:47   DG Chest Port 1 View  Result Date: 01/21/2023 CLINICAL DATA:  Pneumothorax EXAM: PORTABLE CHEST 1 VIEW COMPARISON:  01/20/2023 FINDINGS: Interval repositioning of right-sided pigtail chest tube, tip remains about the right lung base. Complete consolidation of the right lower lobe and elevation of the right hemidiaphragm. No visible pneumothorax. Small layering left  pleural effusion. Cardiomegaly. Osseous structures unremarkable. IMPRESSION: 1. Interval repositioning of right-sided pigtail chest tube, tip remains about the right lung base. No visible pneumothorax. 2. Complete consolidation of the right lower lobe and elevation of the right hemidiaphragm. 3. Small layering left pleural effusion. 4. Cardiomegaly. Electronically Signed   By: Jearld Lesch M.D.   On: 01/21/2023 09:51   DG CHEST PORT 1 VIEW  Result Date: 01/20/2023 CLINICAL DATA:  Pleural effusion EXAM: PORTABLE CHEST 1 VIEW COMPARISON:  01/19/2023 FINDINGS: Shallow inspiration. Diffuse cardiac enlargement with mild vascular congestion. Bilateral pleural effusions with basilar atelectasis or consolidation. A right chest tube is in place without change in position since prior study. No visible residual pneumothorax. Tortuous aorta. Overall stable appearance since previous study. IMPRESSION: 1. Bilateral pleural effusions with basilar atelectasis  or consolidation. 2. Right chest tube remains in place. No significant residual pneumothorax. 3. Cardiac enlargement with pulmonary vascular congestion Electronically Signed   By: Burman Nieves M.D.   On: 01/20/2023 17:44   CT CHEST WO CONTRAST  Result Date: 01/20/2023 CLINICAL DATA:  Pneumonia. EXAM: CT CHEST WITHOUT CONTRAST TECHNIQUE: Multidetector CT imaging of the chest was performed following the standard protocol without IV contrast. RADIATION DOSE REDUCTION: This exam was performed according to the departmental dose-optimization program which includes automated exposure control, adjustment of the mA and/or kV according to patient size and/or use of iterative reconstruction technique. COMPARISON:  01/09/2023 FINDINGS: Cardiovascular: The heart is enlarged. Trace pericardial effusion similar to prior. Mitral annular calcification evident. Mild atherosclerotic calcification is noted in the wall of the thoracic aorta. Enlargement of the pulmonary outflow tract/main pulmonary arteries suggests pulmonary arterial hypertension. Mediastinum/Nodes: Multinodular thyroid gland with exophytic posterior right 1.9 cm thyroid nodule. Scattered upper normal mediastinal lymph nodes. No evidence for gross hilar lymphadenopathy although assessment is limited by the lack of intravenous contrast on the current study. The esophagus has normal imaging features. There is no axillary lymphadenopathy. Lungs/Pleura: Small anterior right pneumothorax evident with right pleural drain evident. Generalized improvement in aeration of the right upper lung with persistent right base collapse/consolidative opacity. Similar generalized improvement in aeration of the left lung with persistent but decreased left base collapse/consolidation. Small left pleural effusion is similar to prior. Pleural fluid on the right has decreased in the interval. Upper Abdomen: Unremarkable. Musculoskeletal: No worrisome lytic or sclerotic osseous  abnormality. IMPRESSION: 1. Small anterior right pneumothorax with right pleural drain in place. 2. Generalized improvement in aeration of both lungs with persistent but decreased bibasilar collapse/consolidation. 3. Small left pleural effusion, similar to prior. 4. Enlargement of the pulmonary outflow tract/main pulmonary arteries suggests pulmonary arterial hypertension. 5. Multinodular thyroid gland with exophytic posterior right 1.9 cm thyroid nodule. Recommend thyroid US (ref: J Am Coll Radiol. 2015 Feb;12(2): 143-50). 6.  Aortic Atherosclerosis (ICD10-I70.0). Electronically Signed   By: Kennith Center M.D.   On: 01/20/2023 07:58   DG Chest Port 1 View  Result Date: 01/19/2023 CLINICAL DATA:  [id number] with right pleural effusion and chest tube in place. EXAM: PORTABLE CHEST 1 VIEW COMPARISON:  Portable chest yesterday at 4:46 a.m. FINDINGS: 5:21 a.m. there is a small right apical pneumothorax again noted estimated 5% or less of the chest volume. There is no mediastinal shift. A pigtail right chest tube in the lower hemithorax has been pulled back, with only the pigtail portion and about 1.5 cm of the straight portion of the distal catheter remaining intrathoracic. There is persistent dense consolidation extending over the right lower lung field  and a small to moderate-sized right pleural effusion persists as well. This is unchanged as well as a small left pleural effusion with overlying hazy atelectasis or consolidation in the left lower lung field. The left mid and both upper lung fields remain clear. There is cardiomegaly without evidence of CHF. Aortic atherosclerosis. Osteopenia. IMPRESSION: 1. Small right apical pneumothorax, estimated 5% or less of the chest volume. 2. The right chest tube has been pulled back, with only the pigtail portion and about 1.5 cm of the straight portion of the distal catheter remaining in the chest. 3. Stable appearance of the right greater than left pleural effusions with  overlying atelectasis or consolidation. 4. Cardiomegaly. Electronically Signed   By: Almira Bar M.D.   On: 01/19/2023 06:51   DG Chest Port 1 View  Result Date: 01/18/2023 CLINICAL DATA:  82 year old female with history of pleural effusion on the right. EXAM: PORTABLE CHEST 1 VIEW COMPARISON:  Chest x-ray 01/17/2023. FINDINGS: Previously noted small bore right-sided chest tube is similar in position with tip projecting over the lower right hemithorax. Persistent opacity throughout the right mid to lower hemithorax compatible with atelectasis and/or consolidation with superimposed moderate to large right pleural effusion. Small right apical pneumothorax, unchanged. Small left pleural effusion with atelectasis and/or consolidation in the left lung base as well. Diffuse interstitial prominence and widespread peribronchial cuffing throughout the aerated portion of the left lung. Pulmonary vasculature does not appear engorged. Heart size is enlarged. The patient is rotated to the right on today's exam, resulting in distortion of the mediastinal contours and reduced diagnostic sensitivity and specificity for mediastinal pathology. IMPRESSION: 1. Stable position of right chest tube with persistent right hydropneumothorax. 2. Small left pleural effusion. 3. Extensive bibasilar areas of atelectasis and/or consolidation, similar to the prior study. 4. Diffuse interstitial prominence and peribronchial cuffing, suggesting a background of bronchitis. 5. Cardiomegaly. Electronically Signed   By: Trudie Reed M.D.   On: 01/18/2023 08:10   DG Chest Port 1 View  Result Date: 01/17/2023 CLINICAL DATA:  Respiratory distress EXAM: PORTABLE CHEST 1 VIEW COMPARISON:  Chest x-ray dated January 17, 2023 FINDINGS: Unchanged enlarged cardiac and mediastinal contours. Small right apical pneumothorax. Right-sided chest tube in place. Similar heterogeneous opacities of the lower right hemithorax. Increased left basilar opacities.  IMPRESSION: 1. Small right apical pneumothorax. Right-sided chest tube in place. 2. Similar heterogeneous opacities of the lower right hemithorax. 3. Increased left basilar opacities, which may be due to atelectasis, infection or aspiration. Electronically Signed   By: Allegra Lai M.D.   On: 01/17/2023 11:13   DG Chest Port 1 View  Result Date: 01/17/2023 CLINICAL DATA:  227 T1-2.  Status post thoracostomy tube placement. EXAM: PORTABLE CHEST 1 VIEW COMPARISON:  Portable chest yesterday at 12:27 p.m. FINDINGS: 5:08 a.m. A right pigtail tube thoracostomy again is noted with the pigtail superimposing in the lower lung field. Small to moderate right pleural effusion remains with overlying consolidation or atelectasis over the right mid to lower lung field. The right apex is clear. On the left, small pleural effusion continues to be seen with overlying hazy airspace disease. Remaining left lung is clear. The heart is enlarged. Stable mediastinum with aortic tortuosity, ectasia and scattered calcific plaque. There is mild perihilar vascular prominence. Osteopenia. Overall aeration is not significantly changed allowing for positional differences. IMPRESSION: 1. No significant change from yesterday's most recent exam. Right pigtail thoracostomy tube remains in place. 2. Small to moderate right pleural effusion remains with overlying  dense consolidation or atelectasis. 3. Small left pleural effusion with overlying less dense hazy airspace disease. 4. Cardiomegaly with mild perihilar vascular prominence. Electronically Signed   By: Almira Bar M.D.   On: 01/17/2023 07:08   DG Chest Port 1 View  Result Date: 01/16/2023 CLINICAL DATA:  Status post thoracostomy tube placement. EXAM: PORTABLE CHEST 1 VIEW COMPARISON:  Same day. FINDINGS: Interval placement of right-sided pleural pigtail drainage catheter. Pleural effusion is significantly smaller. No definite pneumothorax is noted. IMPRESSION: Right pleural  effusion is significantly smaller status post pleural drainage catheter placement. Electronically Signed   By: Lupita Raider M.D.   On: 01/16/2023 13:43   DG Chest Port 1 View  Result Date: 01/16/2023 CLINICAL DATA:  Status post right thoracentesis. EXAM: PORTABLE CHEST 1 VIEW COMPARISON:  Earlier the same day. FINDINGS: Since the prior study, patient underwent a right-sided thoracentesis. There is mild interval decrease in the right pleural effusion, which can be quantified as moderate-to-large on this exam. No pneumothorax seen. There is small left pleural effusion with underlying compressive atelectatic changes without significant interval change. No left pneumothorax. Evaluation of cardiomediastinal silhouette is nondiagnostic due to right lower hemithorax opacification. No acute osseous abnormalities. The soft tissues are within normal limits. IMPRESSION: Mild interval decrease in right pleural effusion status post thoracentesis. No pneumothorax. Electronically Signed   By: Jules Schick M.D.   On: 01/16/2023 11:48   DG CHEST PORT 1 VIEW  Result Date: 01/16/2023 CLINICAL DATA:  Dyspnea EXAM: PORTABLE CHEST 1 VIEW COMPARISON:  01/15/2023 FINDINGS: Large right and moderate left pleural effusions are present with bibasilar compressive atelectasis. Superimposed mild perihilar interstitial pulmonary edema is present and together the findings suggest changes of moderate cardiogenic failure. No pneumothorax. Cardiac silhouette is largely obscured by pleural fluid on this rotated examination. No acute bone abnormality. IMPRESSION: 1. Moderate cardiogenic failure with moderate to large pleural effusions, right greater than left. Electronically Signed   By: Helyn Numbers M.D.   On: 01/16/2023 03:37   DG Chest Port 1 View  Result Date: 01/15/2023 CLINICAL DATA:  [id number] Respiratory failure (HCC) [id number] EXAM: PORTABLE CHEST 1 VIEW COMPARISON:  01/14/2023. FINDINGS: Central pulmonary vascular congestion, similar  to the prior study. There is homogeneous opacification of right lower hemithorax with shift of mediastinal structures to the right suggesting combination of right lung lower lobe collapse and/or consolidation with pleural effusion. There is improved aeration of the right upper lobe when compared to the prior exam. There is small left pleural effusion with probable associated compressive atelectatic changes in the left lung lower lobe, essentially unchanged. No pneumothorax on either side. Evaluation of cardiomediastinal silhouette is limited due to right hemithorax opacification. No acute osseous abnormalities. The soft tissues are within normal limits. IMPRESSION: 1. Improved aeration of the right upper lobe when compared to the prior exam. 2. Persistent right lower lobe collapse and/or consolidation with pleural effusion. 3. Small left pleural effusion with probable associated compressive atelectatic changes in the left lung lower lobe. 4. Central pulmonary vascular congestion. Electronically Signed   By: Jules Schick M.D.   On: 01/15/2023 08:13   DG CHEST PORT 1 VIEW  Result Date: 01/14/2023 CLINICAL DATA:  Pleural effusion, CHF EXAM: PORTABLE CHEST 1 VIEW COMPARISON:  Previous studies including the examination of 01/13/2023 FINDINGS: Transverse diameter of heart is increased. Dense calcification is seen in the mitral annulus. Central pulmonary vessels are prominent. Increase in interstitial markings in both lungs suggests interstitial edema. There is possible interval  decrease in left pleural effusion. There is opacification of right mid and right lower lung fields with interval worsening suggesting possible increase in right pleural effusion and underlying atelectasis. There is no pneumothorax. IMPRESSION: Cardiomegaly. Prominence of central pulmonary vessels suggest COPD. There is increased opacification of right mid and right lower lung fields suggesting increase in right pleural effusion and possibly  worsening atelectasis. Electronically Signed   By: Ernie Avena M.D.   On: 01/14/2023 08:32   DG CHEST PORT 1 VIEW  Result Date: 01/13/2023 CLINICAL DATA:  Hypoxia EXAM: PORTABLE CHEST 1 VIEW COMPARISON:  01/13/2023 earlier. FINDINGS: Decreasing right-sided effusion with significant residual. Adjacent opacity. Small left effusion with adjacent opacity. Vascular congestion. No pneumothorax. Enlarged cardiopericardial silhouette with calcified tortuous aorta. Overlapping cardiac leads. Film is rotated. IMPRESSION: Decreased right effusion compared to previous. Significant residual. Persistent small left effusion. Enlarged heart with vascular congestion. No pneumothorax. Electronically Signed   By: Karen Kays M.D.   On: 01/13/2023 18:34   DG CHEST PORT 1 VIEW  Result Date: 01/13/2023 CLINICAL DATA:  Acute hypoxic respiratory failure EXAM: PORTABLE CHEST 1 VIEW COMPARISON:  Chest x-ray 01/12/2023 FINDINGS: There is complete opacification of the right hemithorax, significantly worse when compared to prior study. Small left pleural effusion persists. Cardiomediastinal silhouette is stable. There is no pneumothorax or acute fracture. IMPRESSION: Complete opacification of the right hemithorax, significantly worse when compared to prior study. Findings are most consistent with a large right pleural effusion. Underlying atelectasis/consolidation not excluded. Electronically Signed   By: Darliss Cheney M.D.   On: 01/13/2023 15:08   DG Chest Port 1 View  Result Date: 01/12/2023 CLINICAL DATA:  Respiratory failure. EXAM: PORTABLE CHEST 1 VIEW COMPARISON:  Chest x-ray dated January 10, 2023. FINDINGS: Unchanged cardiomegaly. Similar moderate to large right and small left pleural effusions with right middle and lower lobe collapse. Similar patchy opacities in the right upper lobe. Unchanged left lower lobe atelectasis. No pneumothorax. No acute osseous abnormality. IMPRESSION: 1. Similar moderate to large right and  small left pleural effusions with right middle and lower lobe collapse. 2. Similar right upper lobe airspace disease which could reflect pulmonary edema or pneumonia. Electronically Signed   By: Obie Dredge M.D.   On: 01/12/2023 08:46   DG CHEST PORT 1 VIEW  Result Date: 01/10/2023 CLINICAL DATA:  66501 Respiratory failure (HCC) 66501 EXAM: PORTABLE CHEST 1 VIEW COMPARISON:  Chest x-ray 01/10/2023 11:51 a.m. FINDINGS: Interval removal of endotracheal and enteric tubes. The heart and mediastinal contours are within normal limits. Persistent low lung volumes with at least moderate volume right and small left pleural effusions. Associated underlying airspace opacities. Mild pulmonary edema. No pneumothorax. No acute osseous abnormality. IMPRESSION: 1. Persistent low lung volumes with at least moderate volume right and small left pleural effusions. 2. Mild pulmonary edema. 3. Interval removal endotracheal and enteric tubes. Electronically Signed   By: Tish Frederickson M.D.   On: 01/10/2023 23:17   DG CHEST PORT 1 VIEW  Result Date: 01/10/2023 CLINICAL DATA:  Orogastric tube placement.  Intubation. EXAM: PORTABLE CHEST 1 VIEW COMPARISON:  Same day. FINDINGS: Endotracheal tube is in grossly good position. Nasogastric tube is seen entering stomach. IMPRESSION: Endotracheal and nasogastric tubes in grossly good position. Electronically Signed   By: Lupita Raider M.D.   On: 01/10/2023 13:10   DG Abd Portable 1V  Result Date: 01/10/2023 CLINICAL DATA:  NG tube placement. EXAM: PORTABLE ABDOMEN - 1 VIEW COMPARISON:  03/23/2015 FINDINGS: NG tube tip  is in the mid stomach. Proximal side port projects at or just below the GE junction. Tube could be advanced another 3-4 cm to ensure that the side port is below the GE junction. Visualized portion the abdomen shows nonspecific gas pattern. Bones are demineralized with fusion hardware noted in the lumbosacral region. IMPRESSION: NG tube tip is in the mid stomach with  proximal side port at or just below the GE junction. Tube could be advanced another 3-4 cm to ensure that the side port is below the GE junction. Electronically Signed   By: Kennith Center M.D.   On: 01/10/2023 13:00   DG CHEST PORT 1 VIEW  Result Date: 01/10/2023 CLINICAL DATA:  Hypoxia EXAM: PORTABLE CHEST 1 VIEW COMPARISON:  01/09/2023 FINDINGS: Stable cardiomediastinal contours. There is a large right pleural effusion with significantly diminished aeration to the basilar right upper lobe, right middle lobe and right base. This is unchanged from previous exam. Smaller left pleural effusion is also unchanged with atelectasis in volume loss from the left lower lobe. No new findings. IMPRESSION: No change in large right and small left pleural effusions with associated atelectasis and volume loss. Electronically Signed   By: Signa Kell M.D.   On: 01/10/2023 08:15   CT CHEST WO CONTRAST  Result Date: 01/09/2023 CLINICAL DATA:  Shortness of breath EXAM: CT CHEST WITHOUT CONTRAST TECHNIQUE: Multidetector CT imaging of the chest was performed following the standard protocol without IV contrast. RADIATION DOSE REDUCTION: This exam was performed according to the departmental dose-optimization program which includes automated exposure control, adjustment of the mA and/or kV according to patient size and/or use of iterative reconstruction technique. COMPARISON:  Chest CT dated May 22, 2022 FINDINGS: Cardiovascular: Cardiomegaly. Trace pericardial effusion. Normal caliber thoracic aorta with moderate calcified plaque. Mitral annular calcifications. Mild coronary artery calcifications. Mediastinum/Nodes: Esophagus is unremarkable. Enlarged multinodular right thyroid lobe, unchanged when compared with the prior exam. Lungs/Pleura: Complete occlusion of the right-sided bronchi. Complete collapse of the right lower lobe and right middle lobe. Large right and small left loculated pleural effusions. Near-complete  collapse of the right upper lobe, aerated portion with diffuse ground-glass opacities. Mild patchy ground-glass opacities of the left hemithorax with small area of consolidation seen in the lingula on series 3, image 65. Upper Abdomen: Prior cholecystectomy.  No acute abnormality. Musculoskeletal: Soft tissue gas of the posterior right chest wall, likely due to recent thoracentesis. No aggressive appearing osseous lesions. IMPRESSION: 1. Complete occlusion of the right-sided bronchi, likely due to large volume aspiration. 2. Complete collapse of the right lower lobe and right middle lobe. Near-complete collapse of the right upper lobe. 3. Diffuse ground-glass opacities of the aerated portion of the right upper lobe, likely due to re-expansion pulmonary edema given recent thoracentesis. 4. Large right and small left loculated pleural effusions. 5. Mild patchy ground-glass opacities of the left hemithorax with small area of consolidation seen in the lingula, likely infectious or inflammatory. 6. Aortic Atherosclerosis (ICD10-I70.0). Electronically Signed   By: Allegra Lai M.D.   On: 01/09/2023 15:02   DG CHEST PORT 1 VIEW  Result Date: 01/09/2023 CLINICAL DATA:  [id number] S/P thoracentesis [id number] EXAM: PORTABLE CHEST 1 VIEW COMPARISON:  Earlier the same day at 5:25 a.m. FINDINGS: Since the prior study, patient underwent presumed right-sided thoracentesis. There is interval decrease in the right pleural effusion with now aeration of the right upper lung zone. No pneumothorax. There is small left pleural effusion, grossly similar to the prior study. No pneumothorax. Rest  of the findings are essentially unchanged. IMPRESSION: 1. Interval decrease in right pleural effusion. No pneumothorax. Electronically Signed   By: Jules Schick M.D.   On: 01/09/2023 11:22   DG CHEST PORT 1 VIEW  Result Date: 01/09/2023 CLINICAL DATA:  142230 Pleural effusion 142230 EXAM: PORTABLE CHEST - 1 VIEW COMPARISON:  01/08/2023,  01/04/2023 FINDINGS: The patient is rotated to the right. The mediastinum and cardiac silhouette are obscured, unchanged. Complete opacification of the right hemithorax. Similar appearing mild hazy opacities in the left lung base. The remaining left lung is clear. No acute osseous abnormality. IMPRESSION: 1. Complete opacification of the right hemithorax, likely due to a combination of pleural effusion and airspace disease. 2. Similar appearing mild hazy opacities in the left lung base. Electronically Signed   By: Marliss Coots M.D.   On: 01/09/2023 08:09   DG CHEST PORT 1 VIEW  Result Date: 01/08/2023 CLINICAL DATA:  Hypoxia EXAM: PORTABLE CHEST - 1 VIEW COMPARISON:  01/04/2023 FINDINGS: Complete opacification of right hemithorax since prior exam, with mild rightward mediastinal shift as before. Coarse perihilar interstitial opacities in the left without new infiltrate. Heart size is occult to assess due to adjacent opacities. Aortic Atherosclerosis (ICD10-170.0). Visualized bones unremarkable. IMPRESSION: Complete opacification of right hemithorax, new since prior exam. Electronically Signed   By: Corlis Leak M.D.   On: 01/08/2023 09:35   ECHOCARDIOGRAM COMPLETE  Result Date: 01/05/2023    ECHOCARDIOGRAM REPORT   Patient Name:   MEYA TROSCLAIR Date of Exam: 01/05/2023 Medical Rec #:  [id number]      Height:       62.0 in Accession #:    [id number]     Weight:       194.7 lb Date of Birth:  1940-12-24      BSA:          1.890 m Patient Age:    82 years       BP:           102/63 mmHg Patient Gender: F              HR:           102 bpm. Exam Location:  Inpatient Procedure: 2D Echo, Cardiac Doppler and Color Doppler Indications:    acute systolic chf  History:        Patient has prior history of Echocardiogram examinations, most                 recent 07/26/2013. History of Takotsubo, Arrythmias:Atrial                 Fibrillation; Signs/Symptoms:Shortness of Breath.  Sonographer:    Delcie Roch RDCS  Referring Phys: [id number] CHING T TU IMPRESSIONS  1. Left ventricular ejection fraction, by estimation, is 55% with beat to beat variability. The left ventricle has normal function. The left ventricle has no regional wall motion abnormalities. There is mild left ventricular hypertrophy. Left ventricular diastolic parameters are indeterminate.  2. Right ventricular systolic function is mildly reduced. The right ventricular size is moderately enlarged. There is moderately elevated pulmonary artery systolic pressure. The estimated right ventricular systolic pressure is 50.5 mmHg.  3. Left atrial size was severely dilated.  4. Right atrial size was severely dilated.  5. The mitral valve is degenerative. Severe mitral valve regurgitation. No evidence of mitral stenosis. Severe mitral annular calcification.  6. 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  7. The aortic valve is tricuspid. There is mild calcification of  the aortic valve. Aortic valve regurgitation is mild. No aortic stenosis is present.  8. The inferior vena cava is dilated in size with <50% respiratory variability, suggesting right atrial pressure of 15 mmHg. FINDINGS  Left Ventricle: Left ventricular ejection fraction, by estimation, is 55%. The left ventricle has normal function. The left ventricle has no regional wall motion abnormalities. The left ventricular internal cavity size was normal in size. There is mild left ventricular hypertrophy. Left ventricular diastolic parameters are indeterminate. Right Ventricle: The right ventricular size is moderately enlarged. No increase in right ventricular wall thickness. Right ventricular systolic function is mildly reduced. There is moderately elevated pulmonary artery systolic pressure. The tricuspid regurgitant velocity is 2.98 m/s, and with an assumed right atrial pressure of 15 mmHg, the estimated right ventricular systolic pressure is 50.5 mmHg. Left Atrium: Left atrial size was severely dilated. Right  Atrium: Right atrial size was severely dilated. Pericardium: There is no evidence of pericardial effusion. Mitral Valve: The mitral valve is degenerative in appearance. Severe mitral annular calcification. Severe mitral valve regurgitation. No evidence of mitral valve stenosis. The mean mitral valve gradient is 3.0 mmHg with average heart rate of 101 bpm. Tricuspid Valve: The tricuspid valve is normal in structure. 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 No evidence of tricuspid stenosis. Aortic Valve: The aortic valve is tricuspid. There is mild calcification of the aortic valve. Aortic valve regurgitation is mild. Aortic regurgitation PHT measures 451 msec. No aortic stenosis is present. Pulmonic Valve: The pulmonic valve was normal in structure. Pulmonic valve regurgitation is trivial. No evidence of pulmonic stenosis. Aorta: The aortic root is normal in size and structure. Venous: The inferior vena cava is dilated in size with less than 50% respiratory variability, suggesting right atrial pressure of 15 mmHg. The inferior vena cava and the hepatic vein show a pattern of systolic flow reversal, suggestive of tricuspid regurgitation. IAS/Shunts: No atrial level shunt detected by color flow Doppler. Additional Comments: There is pleural effusion in the right lateral region.  LEFT VENTRICLE PLAX 2D LVIDd:         4.80 cm LVIDs:         3.80 cm LV PW:         1.10 cm LV IVS:        1.20 cm LVOT diam:     2.10 cm LVOT Area:     3.46 cm  RIGHT VENTRICLE             IVC RV Basal diam:  3.60 cm     IVC diam: 2.60 cm RV S prime:     10.90 cm/s TAPSE (M-mode): 1.1 cm LEFT ATRIUM              Index        RIGHT ATRIUM           Index LA diam:        5.10 cm  2.70 cm/m   RA Area:     29.70 cm LA Vol (A2C):   126.0 ml 66.67 ml/m  RA Volume:   104.00 ml 55.03 ml/m LA Vol (A4C):   94.0 ml  49.74 ml/m LA Biplane Vol: 109.0 ml 57.68 ml/m  AORTIC VALVE AI PHT:      451 msec  AORTA Ao Root diam: 2.80 cm Ao Asc diam:  3.60  cm MITRAL VALVE           TRICUSPID VALVE MV Mean grad: 3.0 mmHg TR Peak grad:   35.5 mmHg  TR Vmax:        298.00 cm/s                         SHUNTS                        Systemic Diam: 2.10 cm Weston Brass MD Electronically signed by Weston Brass MD Signature Date/Time: 01/05/2023/8:37:43 PM    Final    DG Chest Port 1 View  Result Date: 01/04/2023 CLINICAL DATA:  Sepsis EXAM: PORTABLE CHEST 1 VIEW COMPARISON:  X-ray 09/20/22 FINDINGS: Developing moderate right effusion and adjacent lung opacity. Film is rotated to the right. Enlarged cardiopericardial silhouette with a tortuous aorta. Interstitial prominence again seen. Apical pleural thickening. Subtle patchy opacity left lung base as well. No pneumothorax. Overlapping cardiac leads. Osteopenia. IMPRESSION: Developing moderate right effusion and adjacent lung opacities. Mild opacity left lung base. Recommend follow-up. Acute process is possible such as infiltrate Electronically Signed   By: Karen Kays M.D.   On: 01/04/2023 13:36    Microbiology: No results found for this or any previous visit (from the past 240 hour(s)).   Labs: Basic Metabolic Panel: Recent Labs  Lab 01/25/23 0037 01/26/23 0344 01/27/23 0311 01/28/23 0430 01/29/23 0051  NA 135 135 134* 136 137  K 4.4 4.4 3.9 3.6 3.9  CL 94* 94* 91* 92* 92*  CO2 30 30 32 33* 33*  GLUCOSE 109* 170* 83 100* 104*  BUN 25* 31* 33* 32* 29*  CREATININE 0.98 1.35* 1.33* 1.42* 1.34*  CALCIUM 10.1 10.0 9.7 9.4 9.5  MG  --   --  2.3  --   --   PHOS  --   --  4.3  --   --    Liver Function Tests: Recent Labs  Lab 01/27/23 0311  AST 40  ALT 32  ALKPHOS 123  BILITOT 0.8  PROT 6.4*  ALBUMIN 3.0*   No results for input(s): "LIPASE", "AMYLASE" in the last 168 hours. No results for input(s): "AMMONIA" in the last 168 hours. CBC: Recent Labs  Lab 01/24/23 0057 01/26/23 0344 01/27/23 0311 01/28/23 0430  WBC 6.5 6.6 6.4 6.7  NEUTROABS  --   --  4.2  --    HGB 11.3* 11.4* 11.2* 11.3*  HCT 37.1 37.6 37.2 36.9  MCV 94.2 96.7 94.4 95.6  PLT 252 270 273 264   Cardiac Enzymes: No results for input(s): "CKTOTAL", "CKMB", "CKMBINDEX", "TROPONINI" in the last 168 hours. BNP: BNP (last 3 results) Recent Labs    01/04/23 1250 01/18/23 0307  BNP 563.3* 351.6*    ProBNP (last 3 results) No results for input(s): "PROBNP" in the last 8760 hours.  CBG: No results for input(s): "GLUCAP" in the last 168 hours.     Signed:  Zannie Cove MD.  [facility] 01/29/2023, 10:07 AM

## 2023-01-29 NOTE — Progress Notes (Signed)
Mobility Specialist Progress Note:   01/29/23 1012  Mobility  Activity Ambulated with assistance in room  Level of Assistance Minimal assist, patient does 75% or more  Assistive Device Front wheel walker  Distance Ambulated (ft) 15 ft  Activity Response Tolerated well  Mobility Referral Yes  $Mobility charge 1 Mobility  Mobility Specialist Start Time (ACUTE ONLY) 0945  Mobility Specialist Stop Time (ACUTE ONLY) 1010  Mobility Specialist Time Calculation (min) (ACUTE ONLY) 25 min    Pre Mobility: 102 HR , 116/53 BP  During Mobility: 121 HR  Post Mobility: 109 HR , 95% SpO2 2 L  Pt received in chair, agreeable to mobility. MinA to stand. CG during ambulation. Pt denied any feelings of pain or SOB during ambulation. Asymptomatic on 2 L. Pt returned to chair with call bell in reach and all needs met.   Leory Plowman  Mobility Specialist Please contact via [employer] office at [phone]

## 2023-01-29 NOTE — TOC Progression Note (Addendum)
Transition of Care [facility]) - Progression Note    Patient Details  Name: [name] MRN: [id number] Date of Birth: September 07, 1940  Transition of [facility]) CM/SW Contact  Eduard Roux, [place] Phone Number: 01/29/2023, 11:12 AM  Clinical Narrative:    Received insurance Berkley Harvey [phone] 8/21-8/23  Contacted Lacinda Axon- Waiting on confirmation SNF has bi-pap.   Expected Discharge Plan: Skilled Nursing Facility Barriers to Discharge: Continued Medical Work up, SNF Pending bed offer  Expected Discharge Plan and Services In-house Referral: Clinical Social Work   Post Acute Care Choice: Skilled Nursing Facility Living arrangements for the past 2 months: Single Family Home                                       Social Determinants of Health (SDOH) Interventions SDOH Screenings   Food Insecurity: No Food Insecurity (01/24/2023)  Housing: Low Risk  (01/24/2023)  Transportation Needs: No Transportation Needs (01/24/2023)  Utilities: Not At Risk (01/11/2023)  Alcohol Screen: Low Risk  (01/24/2023)  Financial Resource Strain: Low Risk  (01/24/2023)  Tobacco Use: Medium Risk (01/04/2023)    Readmission Risk Interventions     No data to display

## 2023-01-29 NOTE — Progress Notes (Signed)
   01/28/23 2330  BiPAP/CPAP/SIPAP  $ Non-Invasive Home Ventilator  Subsequent  BiPAP/CPAP/SIPAP Pt Type Adult  BiPAP/CPAP/SIPAP SERVO  Mask Type Full face mask  Set Rate 10 breaths/min  Respiratory Rate 25 breaths/min  IPAP 10 cmH20 (PC)  PEEP 6 cmH20  FiO2 (%) 50 %  Minute Ventilation 12.4  Leak 25  Peak Inspiratory Pressure (PIP) 15  Tidal Volume (Vt) 458  Patient Home Equipment No  Press High Alarm 28 cmH2O  Press Low Alarm 5 cmH2O  CPAP/SIPAP surface wiped down Yes  BiPAP/CPAP /SiPAP Vitals  Pulse Rate (!) 57  SpO2 95 %  MEWS Score/Color  MEWS Score 0  MEWS Score Color [name]

## 2023-01-29 NOTE — Progress Notes (Signed)
Physical Therapy Treatment Patient Details Name: [name] MRN: [id number] DOB: [birth year] Today's Date: 01/29/2023   History of Present Illness 82 y.o. female admitted 7/27 with weakness, SOB, AMS. Pt with Rt pleural effusion, s/p thoracentesis 8/1. 8/2 intubated, bronch and extubated. 8/4 Afib with RVR. 8/8 thoracentesis and chest tube placement. 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 Pt's linens noted to be soiled and pt requiring assist for hygiene. Pt able to tolerate standing for an extended time with supervision for linen change and pericare. Pt demonstrating improved anterior lean with cues during standing trials this session. Pt also able to tolerate 2 short gait trials in the room with CGA for safety. Pt continues to be limited by impaired activity tolerance. Pt continues to benefit from PT services to progress toward functional mobility goals.     If plan is discharge home, recommend the following: A little help with walking and/or transfers;A little help with bathing/dressing/bathroom;Assist for transportation;Help with stairs or ramp for entrance;A lot of help with bathing/dressing/bathroom;Assistance with cooking/housework   Can travel by private vehicle     Yes  Equipment Recommendations  BSC/3in1    Recommendations for Other Services       Precautions / Restrictions Precautions Precautions: Fall Precaution Comments: watch SpO2 Restrictions Weight Bearing Restrictions: No     Mobility  Bed Mobility               General bed mobility comments: Pt beginning and ending session in recliner    Transfers Overall transfer level: Needs assistance Equipment used: Rolling walker (2 wheels) Transfers: Sit to/from Stand Sit to Stand: Min assist, Contact guard assist            General transfer comment: STS from recliner x3 with min A on first trial for anterior weight shift progressing to CGA with cues.    Ambulation/Gait Ambulation/Gait assistance: Contact guard assist Gait Distance (Feet): 10 Feet (x2) Assistive device: Rolling walker (2 wheels) Gait Pattern/deviations: Step-through pattern, Trunk flexed, Wide base of support Gait velocity: decr   Pre-gait activities: marching General Gait Details: Slow step-through pattern with pt able to briefly improve upright posture with cues.       Balance Overall balance assessment: Needs assistance Sitting-balance support: No upper extremity supported, Feet supported Sitting balance-Leahy Scale: Fair Sitting balance - Comments: sitting in recliner   Standing balance support: Bilateral upper extremity supported, Reliant on assistive device for balance, During functional activity Standing balance-Leahy Scale: Poor Standing balance comment: with RW support                            Cognition Arousal: Alert Behavior During Therapy: WFL for tasks assessed/performed Overall Cognitive Status: Within Functional Limits for tasks assessed                                          Exercises General Exercises - Lower Extremity Hip Flexion/Marching: AROM, Both, 10 reps, Standing    General Comments General comments (skin integrity, edema, etc.): unstable SpO2 readings 85-97% on 6L O2      Pertinent Vitals/Pain Pain Assessment Pain Assessment: No/denies pain     PT Goals (current goals can  now be found in the care plan section) Acute Rehab PT Goals Patient Stated Goal: get better PT Goal Formulation: With patient Time For Goal Achievement: 01/25/23 Potential to Achieve Goals: Good Progress towards PT goals: Progressing toward goals    Frequency    Min 1X/week      PT Plan Current plan remains appropriate       AM-PAC PT "6 Clicks" Mobility   Outcome  Measure  Help needed turning from your back to your side while in a flat bed without using bedrails?: A Little Help needed moving from lying on your back to sitting on the side of a flat bed without using bedrails?: A Little Help needed moving to and from a bed to a chair (including a wheelchair)?: A Little Help needed standing up from a chair using your arms (e.g., wheelchair or bedside chair)?: A Little Help needed to walk in hospital room?: A Little Help needed climbing 3-5 steps with a railing? : Total 6 Click Score: 16    End of Session Equipment Utilized During Treatment: Oxygen;Gait belt Activity Tolerance: Patient limited by fatigue Patient left: in chair;with call bell/phone within reach Nurse Communication: Mobility status PT Visit Diagnosis: Muscle weakness (generalized) (M62.81);Difficulty in walking, not elsewhere classified (R26.2);Other abnormalities of gait and mobility (R26.89)     Time: 0737-1062 PT Time Calculation (min) (ACUTE ONLY): 29 min  Charges:    $Gait Training: 8-22 mins $Therapeutic Activity: 8-22 mins PT General Charges $$ ACUTE PT VISIT: 1 Visit                     Johny Shock, PTA Acute Rehabilitation Services Secure Chat Preferred  [phone]    Johny Shock 01/29/2023, 3:53 PM

## 2023-01-30 NOTE — TOC Transition Note (Signed)
Transition of Care [facility]) - CM/SW Discharge Note   Patient Details  Name: ANAYLA MCEVERS MRN: [id number] Date of Birth: Mar 18, 1941  Transition of Care [facility]) CM/SW Contact:  Deatra Robinson, [place] Phone Number: 01/30/2023, 11:22 AM   Clinical Narrative: Pt for dc to [place] today. Spoke to [place] in admissions who confirmed BIPAP received and they are prepared to admit pt to room 206. Pt aware of dc and reports agreeable. RN provided with number for report and PTAR arranged for transport. SW signing off at dc.   Dellie Burns, MSW, LCSW [phone] (coverage)        Final next level of care: Skilled Nursing Facility Barriers to Discharge: No Barriers Identified   Patient Goals and CMS Choice CMS Medicare.gov Compare Post Acute Care list provided to:: Patient Choice offered to / list presented to : Patient  Discharge Placement                Patient chooses bed at: [facility] Patient to be transferred to facility by: PTAR Name of family member notified: Pt to update family Patient and family notified of of transfer: 01/30/23  Discharge Plan and Services Additional resources added to the After Visit Summary for   In-house Referral: Clinical Social Work   Post Acute Care Choice: Skilled Nursing Facility                               Social Determinants of Health (SDOH) Interventions SDOH Screenings   Food Insecurity: No Food Insecurity (01/24/2023)  Housing: Low Risk  (01/24/2023)  Transportation Needs: No Transportation Needs (01/24/2023)  Utilities: Not At Risk (01/11/2023)  Alcohol Screen: Low Risk  (01/24/2023)  Financial Resource Strain: Low Risk  (01/24/2023)  Tobacco Use: Medium Risk (01/04/2023)     Readmission Risk Interventions     No data to display

## 2023-01-30 NOTE — Progress Notes (Signed)
RT at bedside to assess pt. RT found pt on 4L [place] with BiPAP (resmed) on stby at bedside.

## 2023-01-30 NOTE — Progress Notes (Signed)
Occupational Therapy Treatment Patient Details Name: [name] MRN: [id number] DOB: 16-Jan-1941 Today's Date: 01/30/2023   History of present illness 82 y.o. female admitted 7/27 with weakness, SOB, AMS. Pt with Rt pleural effusion, s/p thoracentesis 8/1. 8/2 intubated, bronch and extubated. 8/4 Afib with RVR. 8/8 thoracentesis and chest tube placement. 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 Pt demonstrates ability to complete UB ADLs with Mod I to Set up. LB ADLs with Contact guard assist to Min assist, and functional transfers with a RW with Contact guard assist. Pt participated well in session and is making progress toward goals. Pt O2 sat decreased into 80s on 5L coninuous O2 through nasal cannula during funcitonal activities. However, OT is uncertain if the reading was accurate as sensor on forehead was found to be loose and pt showed no SOB or other signs of decreased O2. OT rechecked O2 with sesnor moved to earlobe with pt's O2 sat >/93% on 5L continuous O2 through remainder of session. Pt HR in the upper 90s up to 125 bpm during activities in standing. Pt will benefit from continued acute skilled OT services to address deficits outlined below and increase safety and independence with functional tasks. Post acute discharge, pt will benefit from intensive inpatient rehab services < 3 hours per day to maximize rehab potential.       If plan is discharge home, recommend the following:  A little help with walking and/or transfers;A little help with bathing/dressing/bathroom;Assistance with cooking/housework;Direct supervision/assist for medications management;Direct supervision/assist for financial management;Assist for transportation;Help with stairs or ramp for  entrance   Equipment Recommendations  Other (comment) (defer to next level of care)    Recommendations for Other Services      Precautions / Restrictions Precautions Precautions: Fall Precaution Comments: watch SpO2 Restrictions Weight Bearing Restrictions: No       Mobility Bed Mobility Overal bed mobility: Needs Assistance Bed Mobility: Supine to Sit     Supine to sit: Supervision, HOB elevated, Used rails     General bed mobility comments: with increased time    Transfers Overall transfer level: Needs assistance Equipment used: Rolling walker (2 wheels) Transfers: Sit to/from Stand, Bed to chair/wheelchair/BSC Sit to Stand: Contact guard assist, From elevated surface     Step pivot transfers: Contact guard assist           Balance Overall balance assessment: Needs assistance Sitting-balance support: No upper extremity supported, Feet supported Sitting balance-Leahy Scale: Fair Sitting balance - Comments: sitting EOB   Standing balance support: Single extremity supported, Bilateral upper extremity supported, During functional activity, Reliant on assistive device for balance Standing balance-Leahy Scale: Poor (Poor+ to Fair-)                             ADL either performed or assessed with clinical judgement   ADL Overall ADL's : Needs assistance/impaired Eating/Feeding: Modified independent;Sitting   Grooming: Set up;Sitting   Upper Body Bathing: Set up;Sitting   Lower Body Bathing: Minimal assistance;Sit to/from stand Lower Body Bathing Details (indicate cue type and reason): assistance needed to wash/dry perineal area Upper Body Dressing : Set up;Sitting   Lower Body Dressing: Contact guard assist;Minimal assistance;Sit to/from stand   Toilet Transfer: Contact guard assist;Ambulation;BSC/3in1;Rolling walker (2  wheels)   Toileting- Clothing Manipulation and Hygiene: Minimal assistance       Functional mobility during ADLs:  Contact guard assist;Rolling walker (2 wheels) (To approx. 10 feet. Pt limited by decreased activity tolerance and SOB with ambulation.) General ADL Comments: Pt tolerated approx. 4 minutes in standing during LB ADLs before needing a seated rest break.    Extremity/Trunk Assessment Upper Extremity Assessment Upper Extremity Assessment: Generalized weakness   Lower Extremity Assessment Lower Extremity Assessment: Defer to PT evaluation        Vision       Perception     Praxis      Cognition Arousal: Alert Behavior During Therapy: [facility] for tasks assessed/performed Overall Cognitive Status: No family/caregiver present to determine baseline cognitive functioning                                 General Comments: Pt AAOx4 with cognition largely [facility] but with short term memory deficits notes. Pt also hard of hearing, so intermittently needing repeated commands.        Exercises      Shoulder Instructions       General Comments Pt O2 sat decreased into 80s on 5L coninuous O2 through nasal cannula during funcitonal activities. However, OT is uncertain if the reading was accurate as sensor on forehead was found to be loose and pt showed no SOB or other signs of decreased O2 and with pt reporting she felt "fine." OT rechecked O2 with sesnor moved to earlobe with pt's O2 sat >/93% on 5L continuous O2 through remainder of session. Pt HR in the upper 90s up to 125 bpm during activities in standing.    Pertinent Vitals/ Pain       Pain Assessment Pain Assessment: No/denies pain Pain Intervention(s): Monitored during session  Home Living                                          Prior Functioning/Environment              Frequency  Min 1X/week        Progress Toward Goals  OT Goals(current goals can now be found in the care plan section)  Progress towards OT goals: Progressing toward goals  Acute Rehab OT Goals Patient Stated Goal: To  have more energy and return home  Plan      Co-evaluation                 AM-PAC OT "6 Clicks" Daily Activity     Outcome Measure   Help from another person eating meals?: A Little Help from another person taking care of personal grooming?: A Little Help from another person toileting, which includes using toliet, bedpan, or urinal?: A Little Help from another person bathing (including washing, rinsing, drying)?: A Little Help from another person to put on and taking off regular upper body clothing?: A Little Help from another person to put on and taking off regular lower body clothing?: A Little 6 Click Score: 18    End of Session Equipment Utilized During Treatment: Gait belt;Rolling walker (2 wheels);Oxygen  OT Visit Diagnosis: Unsteadiness on feet (R26.81);Muscle weakness (generalized) (M62.81)   Activity Tolerance Patient tolerated treatment well   Patient Left in chair;with call bell/phone within reach   Nurse Communication Mobility status;Other (comment) (  Pt discharging today to short-term rehab)        Time: 1610-9604 OT Time Calculation (min): 46 min  Charges: OT General Charges $OT Visit: 1 Visit OT Treatments $Self Care/Home Management : 38-52 mins  Athaliah Baumbach "Orson Eva., OTR/L, MA Acute Rehab [phone]   Lendon Colonel 01/30/2023, 10:56 AM

## 2023-01-30 NOTE — Progress Notes (Signed)
Attempted to call [facility] 3 times for the report ,but phone not answered

## 2023-02-03 ENCOUNTER — Telehealth (HOSPITAL_COMMUNITY): Payer: Self-pay

## 2023-02-03 NOTE — Telephone Encounter (Signed)
Called to confirm Heart & Vascular Transitions of Care appointment. Tried calling patient, no answer,unable to leave message.

## 2023-02-04 ENCOUNTER — Other Ambulatory Visit: Payer: Self-pay

## 2023-02-04 ENCOUNTER — Encounter (HOSPITAL_COMMUNITY): Payer: Self-pay

## 2023-02-04 ENCOUNTER — Emergency Department (HOSPITAL_COMMUNITY): Payer: Medicare Other

## 2023-02-04 ENCOUNTER — Ambulatory Visit (HOSPITAL_BASED_OUTPATIENT_CLINIC_OR_DEPARTMENT_OTHER)
Admit: 2023-02-04 | Discharge: 2023-02-04 | Disposition: A | Discharge status: Other Acute Inpatient Hospital | Payer: Medicare Other | Source: Ambulatory Visit | Attending: Physician Assistant | Admitting: Physician Assistant

## 2023-02-04 ENCOUNTER — Inpatient Hospital Stay (HOSPITAL_COMMUNITY)
Admission: EM | Admit: 2023-02-04 | Discharge: 2023-02-09 | DRG: 291 | Disposition: E | Payer: Medicare Other | Attending: Internal Medicine | Admitting: Internal Medicine

## 2023-02-04 VITALS — BP 128/80 | HR 137 | Wt 181.0 lb

## 2023-02-04 DIAGNOSIS — Z8673 Personal history of transient ischemic attack (TIA), and cerebral infarction without residual deficits: Secondary | ICD-10-CM

## 2023-02-04 DIAGNOSIS — Z7951 Long term (current) use of inhaled steroids: Secondary | ICD-10-CM

## 2023-02-04 DIAGNOSIS — I442 Atrioventricular block, complete: Secondary | ICD-10-CM | POA: Diagnosis present

## 2023-02-04 DIAGNOSIS — I4891 Unspecified atrial fibrillation: Secondary | ICD-10-CM | POA: Diagnosis not present

## 2023-02-04 DIAGNOSIS — I3489 Other nonrheumatic mitral valve disorders: Secondary | ICD-10-CM | POA: Diagnosis present

## 2023-02-04 DIAGNOSIS — Z79899 Other long term (current) drug therapy: Secondary | ICD-10-CM

## 2023-02-04 DIAGNOSIS — J9621 Acute and chronic respiratory failure with hypoxia: Secondary | ICD-10-CM | POA: Diagnosis present

## 2023-02-04 DIAGNOSIS — I340 Nonrheumatic mitral (valve) insufficiency: Secondary | ICD-10-CM | POA: Insufficient documentation

## 2023-02-04 DIAGNOSIS — Z66 Do not resuscitate: Secondary | ICD-10-CM | POA: Diagnosis present

## 2023-02-04 DIAGNOSIS — J9622 Acute and chronic respiratory failure with hypercapnia: Secondary | ICD-10-CM | POA: Diagnosis present

## 2023-02-04 DIAGNOSIS — E785 Hyperlipidemia, unspecified: Secondary | ICD-10-CM | POA: Diagnosis present

## 2023-02-04 DIAGNOSIS — I361 Nonrheumatic tricuspid (valve) insufficiency: Secondary | ICD-10-CM | POA: Insufficient documentation

## 2023-02-04 DIAGNOSIS — Z8249 Family history of ischemic heart disease and other diseases of the circulatory system: Secondary | ICD-10-CM

## 2023-02-04 DIAGNOSIS — J9611 Chronic respiratory failure with hypoxia: Secondary | ICD-10-CM

## 2023-02-04 DIAGNOSIS — M109 Gout, unspecified: Secondary | ICD-10-CM | POA: Diagnosis present

## 2023-02-04 DIAGNOSIS — J449 Chronic obstructive pulmonary disease, unspecified: Secondary | ICD-10-CM | POA: Diagnosis present

## 2023-02-04 DIAGNOSIS — Z87891 Personal history of nicotine dependence: Secondary | ICD-10-CM

## 2023-02-04 DIAGNOSIS — Z7901 Long term (current) use of anticoagulants: Secondary | ICD-10-CM

## 2023-02-04 DIAGNOSIS — Z86718 Personal history of other venous thrombosis and embolism: Secondary | ICD-10-CM

## 2023-02-04 DIAGNOSIS — I2510 Atherosclerotic heart disease of native coronary artery without angina pectoris: Secondary | ICD-10-CM | POA: Diagnosis present

## 2023-02-04 DIAGNOSIS — I272 Pulmonary hypertension, unspecified: Secondary | ICD-10-CM | POA: Diagnosis present

## 2023-02-04 DIAGNOSIS — R0602 Shortness of breath: Secondary | ICD-10-CM | POA: Diagnosis present

## 2023-02-04 DIAGNOSIS — J9601 Acute respiratory failure with hypoxia: Secondary | ICD-10-CM

## 2023-02-04 DIAGNOSIS — I959 Hypotension, unspecified: Secondary | ICD-10-CM | POA: Diagnosis present

## 2023-02-04 DIAGNOSIS — G629 Polyneuropathy, unspecified: Secondary | ICD-10-CM | POA: Diagnosis present

## 2023-02-04 DIAGNOSIS — J90 Pleural effusion, not elsewhere classified: Secondary | ICD-10-CM | POA: Diagnosis not present

## 2023-02-04 DIAGNOSIS — I480 Paroxysmal atrial fibrillation: Secondary | ICD-10-CM | POA: Diagnosis not present

## 2023-02-04 DIAGNOSIS — Z86711 Personal history of pulmonary embolism: Secondary | ICD-10-CM | POA: Diagnosis not present

## 2023-02-04 DIAGNOSIS — I5181 Takotsubo syndrome: Secondary | ICD-10-CM | POA: Insufficient documentation

## 2023-02-04 DIAGNOSIS — I509 Heart failure, unspecified: Secondary | ICD-10-CM

## 2023-02-04 DIAGNOSIS — I4819 Other persistent atrial fibrillation: Secondary | ICD-10-CM | POA: Insufficient documentation

## 2023-02-04 DIAGNOSIS — Z8674 Personal history of sudden cardiac arrest: Secondary | ICD-10-CM | POA: Diagnosis not present

## 2023-02-04 DIAGNOSIS — I5033 Acute on chronic diastolic (congestive) heart failure: Principal | ICD-10-CM

## 2023-02-04 DIAGNOSIS — H9190 Unspecified hearing loss, unspecified ear: Secondary | ICD-10-CM | POA: Diagnosis present

## 2023-02-04 DIAGNOSIS — I5043 Acute on chronic combined systolic (congestive) and diastolic (congestive) heart failure: Secondary | ICD-10-CM | POA: Diagnosis not present

## 2023-02-04 DIAGNOSIS — R9431 Abnormal electrocardiogram [ECG] [EKG]: Secondary | ICD-10-CM | POA: Insufficient documentation

## 2023-02-04 DIAGNOSIS — Z7982 Long term (current) use of aspirin: Secondary | ICD-10-CM | POA: Diagnosis not present

## 2023-02-04 DIAGNOSIS — Z833 Family history of diabetes mellitus: Secondary | ICD-10-CM

## 2023-02-04 DIAGNOSIS — R251 Tremor, unspecified: Secondary | ICD-10-CM | POA: Insufficient documentation

## 2023-02-04 DIAGNOSIS — J9612 Chronic respiratory failure with hypercapnia: Secondary | ICD-10-CM

## 2023-02-04 LAB — CBC
HCT: 39.8 % (ref 36.0–46.0)
Hemoglobin: 12 g/dL (ref 12.0–15.0)
MCH: 29.4 pg (ref 26.0–34.0)
MCHC: 30.2 g/dL (ref 30.0–36.0)
MCV: 97.5 fL (ref 80.0–100.0)
Platelets: 211 10*3/uL (ref 150–400)
RBC: 4.08 MIL/uL (ref 3.87–5.11)
RDW: 17.6 % — ABNORMAL HIGH (ref 11.5–15.5)
WBC: 5.5 10*3/uL (ref 4.0–10.5)
nRBC: 0.5 % — ABNORMAL HIGH (ref 0.0–0.2)

## 2023-02-04 LAB — I-STAT CHEM 8, ED
BUN: 29 mg/dL — ABNORMAL HIGH (ref 8–23)
Calcium, Ion: 1.13 mmol/L — ABNORMAL LOW (ref 1.15–1.40)
Chloride: 95 mmol/L — ABNORMAL LOW (ref 98–111)
Creatinine, Ser: 1.2 mg/dL — ABNORMAL HIGH (ref 0.44–1.00)
Glucose, Bld: 112 mg/dL — ABNORMAL HIGH (ref 70–99)
HCT: 41 % (ref 36.0–46.0)
Hemoglobin: 13.9 g/dL (ref 12.0–15.0)
Potassium: 4.5 mmol/L (ref 3.5–5.1)
Sodium: 137 mmol/L (ref 135–145)
TCO2: 33 mmol/L — ABNORMAL HIGH (ref 22–32)

## 2023-02-04 LAB — I-STAT ARTERIAL BLOOD GAS, ED
Acid-Base Excess: 6 mmol/L — ABNORMAL HIGH (ref 0.0–2.0)
Bicarbonate: 32.7 mmol/L — ABNORMAL HIGH (ref 20.0–28.0)
Calcium, Ion: 1.23 mmol/L (ref 1.15–1.40)
HCT: 38 % (ref 36.0–46.0)
Hemoglobin: 12.9 g/dL (ref 12.0–15.0)
O2 Saturation: 96 %
Patient temperature: 97.9
Potassium: 3.9 mmol/L (ref 3.5–5.1)
Sodium: 138 mmol/L (ref 135–145)
TCO2: 34 mmol/L — ABNORMAL HIGH (ref 22–32)
pCO2 arterial: 51.6 mmHg — ABNORMAL HIGH (ref 32–48)
pH, Arterial: 7.408 (ref 7.35–7.45)
pO2, Arterial: 78 mmHg — ABNORMAL LOW (ref 83–108)

## 2023-02-04 LAB — BASIC METABOLIC PANEL
Anion gap: 17 — ABNORMAL HIGH (ref 5–15)
BUN: 23 mg/dL (ref 8–23)
CO2: 30 mmol/L (ref 22–32)
Calcium: 9.9 mg/dL (ref 8.9–10.3)
Chloride: 93 mmol/L — ABNORMAL LOW (ref 98–111)
Creatinine, Ser: 1.21 mg/dL — ABNORMAL HIGH (ref 0.44–1.00)
GFR, Estimated: 45 mL/min — ABNORMAL LOW (ref >60)
Glucose, Bld: 112 mg/dL — ABNORMAL HIGH (ref 70–99)
Potassium: 4.6 mmol/L (ref 3.5–5.1)
Sodium: 140 mmol/L (ref 135–145)

## 2023-02-04 LAB — CBC WITH DIFFERENTIAL/PLATELET
Abs Immature Granulocytes: 0.06 10*3/uL (ref 0.00–0.07)
Basophils Absolute: 0 10*3/uL (ref 0.0–0.1)
Basophils Relative: 1 %
Eosinophils Absolute: 0 10*3/uL (ref 0.0–0.5)
Eosinophils Relative: 1 %
HCT: 39.4 % (ref 36.0–46.0)
Hemoglobin: 11.8 g/dL — ABNORMAL LOW (ref 12.0–15.0)
Immature Granulocytes: 1 %
Lymphocytes Relative: 15 %
Lymphs Abs: 0.8 10*3/uL (ref 0.7–4.0)
MCH: 28.9 pg (ref 26.0–34.0)
MCHC: 29.9 g/dL — ABNORMAL LOW (ref 30.0–36.0)
MCV: 96.3 fL (ref 80.0–100.0)
Monocytes Absolute: 0.5 10*3/uL (ref 0.1–1.0)
Monocytes Relative: 8 %
Neutro Abs: 4.2 10*3/uL (ref 1.7–7.7)
Neutrophils Relative %: 74 %
Platelets: 216 10*3/uL (ref 150–400)
RBC: 4.09 MIL/uL (ref 3.87–5.11)
RDW: 17.7 % — ABNORMAL HIGH (ref 11.5–15.5)
WBC: 5.6 10*3/uL (ref 4.0–10.5)
nRBC: 0.7 % — ABNORMAL HIGH (ref 0.0–0.2)

## 2023-02-04 LAB — COMPREHENSIVE METABOLIC PANEL
ALT: 28 U/L (ref 0–44)
AST: 31 U/L (ref 15–41)
Albumin: 3.6 g/dL (ref 3.5–5.0)
Alkaline Phosphatase: 121 U/L (ref 38–126)
Anion gap: 12 (ref 5–15)
BUN: 23 mg/dL (ref 8–23)
CO2: 29 mmol/L (ref 22–32)
Calcium: 9.5 mg/dL (ref 8.9–10.3)
Chloride: 93 mmol/L — ABNORMAL LOW (ref 98–111)
Creatinine, Ser: 1.22 mg/dL — ABNORMAL HIGH (ref 0.44–1.00)
GFR, Estimated: 44 mL/min — ABNORMAL LOW (ref >60)
Glucose, Bld: 144 mg/dL — ABNORMAL HIGH (ref 70–99)
Potassium: 4.2 mmol/L (ref 3.5–5.1)
Sodium: 134 mmol/L — ABNORMAL LOW (ref 135–145)
Total Bilirubin: 1.4 mg/dL — ABNORMAL HIGH (ref 0.3–1.2)
Total Protein: 6.8 g/dL (ref 6.5–8.1)

## 2023-02-04 LAB — TROPONIN I (HIGH SENSITIVITY)
Troponin I (High Sensitivity): 30 ng/L — ABNORMAL HIGH (ref <18)
Troponin I (High Sensitivity): 27 ng/L — ABNORMAL HIGH (ref <18)

## 2023-02-04 LAB — BRAIN NATRIURETIC PEPTIDE
B Natriuretic Peptide: 604.6 pg/mL — ABNORMAL HIGH (ref 0.0–100.0)
B Natriuretic Peptide: 561.8 pg/mL — ABNORMAL HIGH (ref 0.0–100.0)

## 2023-02-04 LAB — PHOSPHORUS: Phosphorus: 2.9 mg/dL (ref 2.5–4.6)

## 2023-02-04 LAB — CBG MONITORING, ED: Glucose-Capillary: 123 mg/dL — ABNORMAL HIGH (ref 70–99)

## 2023-02-04 LAB — MAGNESIUM: Magnesium: 1.7 mg/dL (ref 1.7–2.4)

## 2023-02-04 MED ORDER — SENNOSIDES-DOCUSATE SODIUM 8.6-50 MG PO TABS
2.0000 | ORAL_TABLET | Freq: Every day | ORAL | Status: DC
  Administered 2023-02-04 – 2023-02-05 (×2): 2 via ORAL
  Filled 2023-02-04 (×2): qty 2

## 2023-02-04 MED ORDER — AMIODARONE HCL IN DEXTROSE 360-4.14 MG/200ML-% IV SOLN
60.0000 mg/h | INTRAVENOUS | Status: AC
  Administered 2023-02-04 (×2): 60 mg/h via INTRAVENOUS
  Filled 2023-02-04 (×2): qty 200

## 2023-02-04 MED ORDER — PANTOPRAZOLE SODIUM 40 MG PO TBEC
40.0000 mg | DELAYED_RELEASE_TABLET | Freq: Every day | ORAL | Status: DC
  Administered 2023-02-05: 40 mg via ORAL
  Filled 2023-02-04: qty 1

## 2023-02-04 MED ORDER — ROSUVASTATIN CALCIUM 5 MG PO TABS
5.0000 mg | ORAL_TABLET | Freq: Every day | ORAL | Status: DC
  Administered 2023-02-05: 5 mg via ORAL
  Filled 2023-02-04: qty 1

## 2023-02-04 MED ORDER — MIDODRINE HCL 5 MG PO TABS
10.0000 mg | ORAL_TABLET | Freq: Three times a day (TID) | ORAL | Status: DC
  Administered 2023-02-05 (×3): 10 mg via ORAL
  Filled 2023-02-04 (×3): qty 2

## 2023-02-04 MED ORDER — POLYETHYLENE GLYCOL 3350 17 G PO PACK: 17.0000 g | PACK | Freq: Every day | ORAL | Status: DC | PRN

## 2023-02-04 MED ORDER — OYSTER SHELL CALCIUM/D3 500-5 MG-MCG PO TABS
1.0000 | ORAL_TABLET | Freq: Two times a day (BID) | ORAL | Status: DC
  Administered 2023-02-04 – 2023-02-05 (×3): 1 via ORAL
  Filled 2023-02-04 (×3): qty 1

## 2023-02-04 MED ORDER — BUPROPION HCL ER (XL) 150 MG PO TB24
300.0000 mg | ORAL_TABLET | Freq: Every day | ORAL | Status: DC
  Administered 2023-02-04 – 2023-02-05 (×2): 300 mg via ORAL
  Filled 2023-02-04: qty 1
  Filled 2023-02-04: qty 2

## 2023-02-04 MED ORDER — GABAPENTIN 300 MG PO CAPS
300.0000 mg | ORAL_CAPSULE | Freq: Every day | ORAL | Status: DC
  Administered 2023-02-04 – 2023-02-05 (×2): 300 mg via ORAL
  Filled 2023-02-04 (×2): qty 1

## 2023-02-04 MED ORDER — APIXABAN 5 MG PO TABS
5.0000 mg | ORAL_TABLET | Freq: Two times a day (BID) | ORAL | Status: DC
  Administered 2023-02-04 – 2023-02-05 (×3): 5 mg via ORAL
  Filled 2023-02-04 (×3): qty 1

## 2023-02-04 MED ORDER — ONDANSETRON HCL 4 MG/2ML IJ SOLN
4.0000 mg | Freq: Four times a day (QID) | INTRAMUSCULAR | Status: DC | PRN
  Administered 2023-02-04 – 2023-02-05 (×2): 4 mg via INTRAVENOUS
  Filled 2023-02-04 (×2): qty 2

## 2023-02-04 MED ORDER — FUROSEMIDE 10 MG/ML IJ SOLN
40.0000 mg | Freq: Once | INTRAMUSCULAR | Status: AC
  Administered 2023-02-04: 40 mg via INTRAVENOUS
  Filled 2023-02-04: qty 4

## 2023-02-04 MED ORDER — LEVALBUTEROL HCL 0.63 MG/3ML IN NEBU
0.6300 mg | INHALATION_SOLUTION | Freq: Four times a day (QID) | RESPIRATORY_TRACT | Status: DC | PRN
  Administered 2023-02-05: 0.63 mg via RESPIRATORY_TRACT
  Filled 2023-02-04: qty 3

## 2023-02-04 MED ORDER — ONDANSETRON HCL 4 MG PO TABS
4.0000 mg | ORAL_TABLET | Freq: Four times a day (QID) | ORAL | Status: DC | PRN
  Administered 2023-02-05 (×2): 4 mg via ORAL
  Filled 2023-02-04 (×2): qty 1

## 2023-02-04 MED ORDER — AMIODARONE HCL IN DEXTROSE 360-4.14 MG/200ML-% IV SOLN
30.0000 mg/h | INTRAVENOUS | Status: DC
  Administered 2023-02-04 – 2023-02-05 (×3): 30 mg/h via INTRAVENOUS
  Filled 2023-02-04 (×4): qty 200

## 2023-02-04 MED ORDER — IPRATROPIUM-ALBUTEROL 20-100 MCG/ACT IN AERS: 1.0000 | INHALATION_SPRAY | Freq: Two times a day (BID) | RESPIRATORY_TRACT | Status: DC

## 2023-02-04 MED ORDER — VITAMIN B-12 1000 MCG PO TABS
1000.0000 ug | ORAL_TABLET | Freq: Every day | ORAL | Status: DC
  Administered 2023-02-04 – 2023-02-05 (×2): 1000 ug via ORAL
  Filled 2023-02-04 (×2): qty 1

## 2023-02-04 MED ORDER — ALLOPURINOL 100 MG PO TABS
100.0000 mg | ORAL_TABLET | Freq: Every day | ORAL | Status: DC
  Administered 2023-02-05: 100 mg via ORAL
  Filled 2023-02-04: qty 1

## 2023-02-04 MED ORDER — ACETAMINOPHEN 325 MG PO TABS: 650.0000 mg | ORAL_TABLET | Freq: Four times a day (QID) | ORAL | Status: DC | PRN

## 2023-02-04 MED ORDER — POTASSIUM CHLORIDE CRYS ER 20 MEQ PO TBCR
40.0000 meq | EXTENDED_RELEASE_TABLET | Freq: Every day | ORAL | Status: DC
  Administered 2023-02-04 – 2023-02-05 (×2): 40 meq via ORAL
  Filled 2023-02-04 (×2): qty 2

## 2023-02-04 MED ORDER — ASPIRIN 81 MG PO CHEW
81.0000 mg | CHEWABLE_TABLET | Freq: Every day | ORAL | Status: DC
  Administered 2023-02-05: 81 mg via ORAL
  Filled 2023-02-04: qty 1

## 2023-02-04 MED ORDER — AMIODARONE LOAD VIA INFUSION
150.0000 mg | Freq: Once | INTRAVENOUS | Status: AC
  Administered 2023-02-04: 150 mg via INTRAVENOUS
  Filled 2023-02-04: qty 83.34

## 2023-02-04 MED ORDER — ACETAMINOPHEN 650 MG RE SUPP: 650.0000 mg | Freq: Four times a day (QID) | RECTAL | Status: DC | PRN

## 2023-02-04 MED ORDER — FUROSEMIDE 10 MG/ML IJ SOLN
40.0000 mg | Freq: Two times a day (BID) | INTRAMUSCULAR | Status: DC
  Administered 2023-02-04 – 2023-02-05 (×3): 40 mg via INTRAVENOUS
  Filled 2023-02-04 (×3): qty 4

## 2023-02-04 MED ORDER — IPRATROPIUM-ALBUTEROL 0.5-2.5 (3) MG/3ML IN SOLN
3.0000 mL | Freq: Two times a day (BID) | RESPIRATORY_TRACT | Status: DC
  Administered 2023-02-04 – 2023-02-05 (×3): 3 mL via RESPIRATORY_TRACT
  Filled 2023-02-04 (×3): qty 3

## 2023-02-04 NOTE — Progress Notes (Signed)
Pt presented to clinic in Afib with RVR. Pt taken via w/c to ED via Rapid Response; daughter accompanied patient.   Stat CBC, CMP, INR drawn in clinic.  Nurse called and left message with [facility] that patient was going to ED for evaluation of above. Left our call back number if any questions.

## 2023-02-04 NOTE — ED Notes (Signed)
Placed on 4L O2 via nasal cannula in triage. Patient has been wearing 2L at home.

## 2023-02-04 NOTE — ED Triage Notes (Signed)
Patient from [facility], discharged recently from this facility and had follow up with HF clinic today. HR 130 in the clinic and brought to ED by rapid response RN. Endorses mild SOB. Is on Eliquis.

## 2023-02-04 NOTE — ED Provider Notes (Signed)
[place] EMERGENCY DEPARTMENT AT [facility] Provider Note   CSN: [id number] Arrival date & time: 02/03/2023  1211     History  Chief Complaint  Patient presents with   Atrial Fibrillation    [name] is a 82 y.o. female with a past medical history significant for history of PE/DVT on Eliquis, A-fib, chronic respiratory failure on 4 L nasal cannula, Takotsubo cardiomyopathy, and CHF who presents to the ED from cardiology due to A. Fib with RVR and worsening shortness of breath over the past few days. Recent admission from 7/27 to 8/21 due to respiratory failure secondary to community-acquired pneumonia complicated by parapneumonic effusion and acute on chronic combined CHF.  She was then discharged to SNF.  Patient mitts to worsening lower extremity edema.  Also endorses orthopnea.  Daughter at bedside states that patient does not want to lay flat at night due to shortness of breath.  Patient has been compliant with her Eliquis.  Is also on amiodarone and metoprolol for A-fib. No recent fever. Daughter states patient appears slightly more pale than baseline.   History obtained from patient and past medical records. No interpreter used during encounter.       Home Medications Prior to Admission medications   Medication Sig Start Date End Date Taking? Authorizing Provider  allopurinol (ZYLOPRIM) 100 MG tablet Take 100 mg by mouth daily.    [provider]  amiodarone (PACERONE) 200 MG tablet Take 1 tablet (200 mg total) by mouth 2 (two) times daily. Take 200 mg twice daily for 2 weeks and then 200 mg daily 01/29/23   Zannie Cove, MD  aspirin 81 MG tablet Take 81 mg by mouth daily.    [provider]  buPROPion (WELLBUTRIN XL) 300 MG 24 hr tablet Take 300 mg by mouth daily. 01/20/17   [provider]  calcium-vitamin D (OSCAL WITH D) 500-200 MG-UNIT tablet Take 1 tablet by mouth 2 (two) times daily.    [provider]  cyanocobalamin  (VITAMIN B12) 1000 MCG tablet Take 1,000 mcg by mouth daily.    [provider]  ELIQUIS 5 MG TABS tablet Take 5 mg by mouth 2 (two) times daily.    [provider]  gabapentin (NEURONTIN) 300 MG capsule Take 1 capsule (300 mg total) by mouth at bedtime. 01/29/23   Zannie Cove, MD  Ipratropium-Albuterol (COMBIVENT) 20-100 MCG/ACT AERS respimat Inhale 1 puff into the lungs 2 (two) times daily.    [provider]  metoprolol succinate (TOPROL-XL) 25 MG 24 hr tablet Take 0.5 tablets (12.5 mg total) by mouth daily. 01/30/23   Zannie Cove, MD  midodrine (PROAMATINE) 10 MG tablet Take 1 tablet (10 mg total) by mouth 3 (three) times daily with meals. 01/29/23   Zannie Cove, MD  pantoprazole (PROTONIX) 40 MG tablet Take 40 mg by mouth daily.    [provider]  polyethylene glycol (MIRALAX / GLYCOLAX) 17 g packet Take 17 g by mouth daily as needed. 01/29/23   Zannie Cove, MD  potassium chloride SA (KLOR-CON M) 20 MEQ tablet Take 2 tablets (40 mEq total) by mouth daily. 01/29/23   Zannie Cove, MD  rosuvastatin (CRESTOR) 5 MG tablet Take 5 mg by mouth at bedtime. 07/02/22   [provider]  senna-docusate (SENOKOT-S) 8.6-50 MG tablet Take 2 tablets by mouth at bedtime. 01/29/23   Zannie Cove, MD  torsemide (DEMADEX) 20 MG tablet Take 1 tablet (20 mg total) by mouth daily. 01/30/23  Zannie Cove, MD  traMADol (ULTRAM) 50 MG tablet Take 1 tablet (50 mg total) by mouth 2 (two) times daily. For moderate pain 01/29/23   Zannie Cove, MD  triamcinolone ointment (KENALOG) 0.1 % Apply 1 Application topically as needed (As directed). 04/17/20   [provider]      Allergies    Patient has no known allergies.    Review of Systems   Review of Systems  Constitutional:  Negative for fever.  Respiratory:  Positive for shortness of breath.   Cardiovascular:  Positive for leg swelling. Negative for chest pain.    Physical Exam Updated Vital  Signs BP 108/79   Pulse (!) 111   Temp 97.9 F (36.6 C)   Resp 20   SpO2 95%  Physical Exam Vitals and nursing note reviewed.  Constitutional:      General: She is not in acute distress.    Appearance: She is not ill-appearing.  HENT:     Head: Normocephalic.  Eyes:     Pupils: Pupils are equal, round, and reactive to light.  Cardiovascular:     Rate and Rhythm: Tachycardia present. Rhythm irregular.     Pulses: Normal pulses.     Heart sounds: Normal heart sounds. No murmur heard.    No friction rub. No gallop.  Pulmonary:     Effort: Pulmonary effort is normal.     Breath sounds: Normal breath sounds.  Abdominal:     General: Abdomen is flat. There is no distension.     Palpations: Abdomen is soft.     Tenderness: There is no abdominal tenderness. There is no guarding or rebound.  Musculoskeletal:        General: Normal range of motion.     Cervical back: Neck supple.     Comments: 2+ pitting edema bilaterally  Skin:    General: Skin is warm and dry.  Neurological:     General: No focal deficit present.     Mental Status: She is alert.  Psychiatric:        Mood and Affect: Mood normal.        Behavior: Behavior normal.     ED Results / Procedures / Treatments   Labs (all labs ordered are listed, but only abnormal results are displayed) Labs Reviewed  CBC WITH DIFFERENTIAL/PLATELET - Abnormal; Notable for the following components:      Result Value   Hemoglobin 11.8 (*)    MCHC 29.9 (*)    RDW 17.7 (*)    nRBC 0.7 (*)    All other components within normal limits  BASIC METABOLIC PANEL - Abnormal; Notable for the following components:   Chloride 93 (*)    Glucose, Bld 112 (*)    Creatinine, Ser 1.21 (*)    GFR, Estimated 45 (*)    Anion gap 17 (*)    All other components within normal limits  BRAIN NATRIURETIC PEPTIDE - Abnormal; Notable for the following components:   B Natriuretic Peptide 561.8 (*)    All other components within normal limits  CBG  MONITORING, ED - Abnormal; Notable for the following components:   Glucose-Capillary 123 (*)    All other components within normal limits  I-STAT ARTERIAL BLOOD GAS, ED - Abnormal; Notable for the following components:   pCO2 arterial 51.6 (*)    pO2, Arterial 78 (*)    Bicarbonate 32.7 (*)    TCO2 34 (*)    Acid-Base Excess 6.0 (*)    All  other components within normal limits  I-STAT CHEM 8, ED - Abnormal; Notable for the following components:   Chloride 95 (*)    BUN 29 (*)    Creatinine, Ser 1.20 (*)    Glucose, Bld 112 (*)    Calcium, Ion 1.13 (*)    TCO2 33 (*)    All other components within normal limits  TROPONIN I (HIGH SENSITIVITY) - Abnormal; Notable for the following components:   Troponin I (High Sensitivity) 30 (*)    All other components within normal limits  TROPONIN I (HIGH SENSITIVITY)    EKG None  Radiology No results found.  Procedures .Critical Care  Performed by: Mannie Stabile, PA-C Authorized by: Mannie Stabile, PA-C   Critical care provider statement:    Critical care time (minutes):  33   Critical care was necessary to treat or prevent imminent or life-threatening deterioration of the following conditions:  Respiratory failure and cardiac failure   Critical care was time spent personally by me on the following activities:  Development of treatment plan with patient or surrogate, discussions with consultants, evaluation of patient's response to treatment, examination of patient, ordering and review of laboratory studies, ordering and review of radiographic studies, ordering and performing treatments and interventions, pulse oximetry, re-evaluation of patient's condition and review of old charts   I assumed direction of critical care for this patient from another provider in my specialty: no     Care discussed with: admitting provider       Medications Ordered in ED Medications  amiodarone (NEXTERONE) 1.8 mg/mL load via infusion 150 mg  (150 mg Intravenous Bolus from Bag 02/05/2023 1350)    Followed by  amiodarone (NEXTERONE PREMIX) 360-4.14 MG/200ML-% (1.8 mg/mL) IV infusion (60 mg/hr Intravenous New Bag/Given 01/25/2023 1352)    Followed by  amiodarone (NEXTERONE PREMIX) 360-4.14 MG/200ML-% (1.8 mg/mL) IV infusion (has no administration in time range)  furosemide (LASIX) injection 40 mg (40 mg Intravenous Given 02/03/2023 1347)    ED Course/ Medical Decision Making/ A&P Clinical Course as of 02/03/2023 1543  Tue Feb 04, 2023  1433 Discussed with Rosann Auerbach with cardiology who will have cardiology see patient in consultation. [CA]  1458 Reassessed patient at bedside.  Patient on nonrebreather after evaluation by respiratory. Pulse ox not picking up O2 saturation consistently after numerous attempts at different locations (forehead, ear lobe, finger).  [CA]    Clinical Course User Index [CA] Mannie Stabile, PA-C                                 Medical Decision Making Amount and/or Complexity of Data Reviewed Independent Historian: friend    Details: Daughter provided history External Data Reviewed: notes.    Details: Cardiology note Labs: ordered. Decision-making details documented in ED Course. Radiology: ordered and independent interpretation performed. Decision-making details documented in ED Course. ECG/medicine tests: ordered and independent interpretation performed. Decision-making details documented in ED Course.  Risk Prescription drug management. Decision regarding hospitalization.   This patient presents to the ED for concern of SOB, this involves an extensive number of treatment options, and is a complaint that carries with it a high risk of complications and morbidity.  The differential diagnosis includes CHF exacerbation, PNA, A. Fib RVR, ACS, PE, etc  82 year old female presents to the ED from cardiology office due to A-fib with RVR and CHF exacerbation.  Patient has chronic respiratory failure on 4 L  nasal  cannula.  Recent admission for community-acquired pneumonia and CHF exacerbation.  Patient admits to worsening shortness of breath and orthopnea over the past 2 to 3 days.  Daughter is at bedside.  Upon arrival patient found to be A-fib with RVR with heart rates between 110-130s. 2+ pitting edema bilaterally. IV lasix and amidarone started after discussion with Dr. Durwin Nora who evaluated patient during initial evaluation. Routine labs ordered. CXR. Patient will likely require admission. Lower suspicion for PE/DVT given patient has been compliant with her Eliquis.   CBC reassuring.  No leukocytosis.  Anemia with hemoglobin 11.8.  ABG with pO2 at 78, CO2 at 51.6 and bicarb of 32.7.  Normal pH. Patient placed on NRB as noted above due to continued hypoxia.  Mildly elevated at 38 likely secondary to demand ischemia.  BNP elevated at 561.  IV Lasix given.  3:41 PM Discussed with Dr. Maisie Fus with [facility] who agrees to admit patient. CXR pending.   Lives in SNF Hx A. Fib  Discussed with Dr. Durwin Nora who evaluated patient at bedside and agrees with assessment and plan.        Final Clinical Impression(s) / ED Diagnoses Final diagnoses:  Atrial fibrillation with RVR (HCC)  Acute on chronic respiratory failure with hypoxia and hypercapnia [facility])    Rx / DC Orders ED Discharge Orders     None         Jesusita Oka 02/07/2023 1546    Gloris Manchester, MD 01/31/2023 1715

## 2023-02-04 NOTE — Addendum Note (Signed)
Encounter addended by: Andrey Farmer, PA-C on: 01/28/2023 12:55 PM  Actions taken: Clinical Note Signed

## 2023-02-04 NOTE — Consult Note (Signed)
Cardiology Consultation   Patient ID: [name] MRN: [id number]; DOB: [birth year]  Admit date: 02/07/2023 Date of Consult: 01/19/2023  PCP:  Herma Carson, MD   [facility] Providers Cardiologist:  [facility]     Patient Profile:   [name] is a 82 y.o. 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 Durwin Nora.  History of Present Illness:   [name] has a history of Takotsubo cardiomyopathy in 2011 in which she had an episode of VT/V-fib arrest during a cardiac catheterization that showed LAD wall motion abnormality however no significant obstructive disease.  She had normalization of her EF on follow-up echocardiograms.  Her EF remained stable at 55-60%.  She then followed with [facility] and was last seen in January 2024.  She has a known history of PAF with last ZIO monitor 2022 showing no evidence of A-fib and was being managed on Lopressor 25 mg twice daily and Eliquis.  She was recently admitted 01/04/23 for acute on chronic hypercarbic and hypercapnic respiratory failure 2/2 RLL CAP and parapneumonic effusion requiring thoracentesis and chest tube placement with underlying HFpEF, Afib with RVR and LLE cellulitis. She was hypotensive and required vasopressor support and midodrine. Echo at that time with LVEF 55% with mildly reduced RV function and RVSP 50 mmHg, severe BAE, severe MR, severe TR. She was diuresed and treated with IV amiodarone for Afib RVR. Rate control was limited by hypotension.  Digoxin was attempted but noted to not help her rate or blood pressure, although this was in the setting of CAP. She required BiPAP that admission but was able to discharge on 4L [place]. Palliative care followed along during that admission and she ewas made DNAR/DNI. She was discharged to SNF.  She was seen today in AHF clinic with [name] and found to be in Afib RVR and worsening SOB and lower extremity edema. She was sent to [facility] for rate control and IV diuresis.     On arrival, EKG with RVR at 134. She was started on IV amiodarone. She reportedly had not taken medications this morning, will give a dose of eliquis. She has received 40 mg IV lasix x 1 dose.  Telemetry currently with rates in the 100-110 range.  During my interview, her daughter is present at bedside and helps with history.  The patient is on a nonrebreather and states her breathing is labored.  She denies chest pain.  She states her lower extremity swelling started worsening 4 to 5 days ago.   Past Medical History:  Diagnosis Date   Cardiac murmur    Echo, EF-55-60, mild tricuspid regurgitation   CVA (cerebral vascular accident) (HCC) 1991   DVT (deep venous thrombosis) (HCC)    PE (pulmonary embolism)    Takotsubo syndrome 08/2009   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   EF - 55-60, Tricuspid valve - mild-moderate regurgitation; 12/14/2009 - EF-55, mild to moderate tricuspid regurgitation    Past Surgical History:  Procedure Laterality Date   BIOPSY  11/02/2018   Procedure: BIOPSY;  Surgeon: Jeani Hawking, MD;  Location: [facility] ENDOSCOPY;  Service: Endoscopy;;   CARDIAC CATHETERIZATION  09/08/2009   EF-45, Free of disease   ESOPHAGOGASTRODUODENOSCOPY N/A 11/02/2018   Procedure: ESOPHAGOGASTRODUODENOSCOPY (EGD);  Surgeon: Jeani Hawking, MD;  Location: [facility] ENDOSCOPY;  Service: Endoscopy;  Laterality: N/A;   FOREIGN BODY REMOVAL  11/02/2018   Procedure: FOREIGN BODY REMOVAL;  Surgeon: Jeani Hawking, MD;  Location: [facility] ENDOSCOPY;  Service: Endoscopy;;   THORACENTESIS Right 01/09/2023   Procedure: THORACENTESIS;  Surgeon: Tomma Lightning, MD;  Location: [facility] ENDOSCOPY;  Service: Pulmonary;  Laterality: Right;     Home Medications:  Prior to Admission medications   Medication Sig Start Date End Date Taking?  Authorizing Provider  allopurinol (ZYLOPRIM) 100 MG tablet Take 100 mg by mouth daily.    [provider]  amiodarone (PACERONE) 200 MG tablet Take 1 tablet (200 mg total) by mouth 2 (two) times daily. Take 200 mg twice daily for 2 weeks and then 200 mg daily 01/29/23   Zannie Cove, MD  aspirin 81 MG tablet Take 81 mg by mouth daily.    [provider]  buPROPion (WELLBUTRIN XL) 300 MG 24 hr tablet Take 300 mg by mouth daily. 01/20/17   [provider]  calcium-vitamin D (OSCAL WITH D) 500-200 MG-UNIT tablet Take 1 tablet by mouth 2 (two) times daily.    [provider]  cyanocobalamin (VITAMIN B12) 1000 MCG tablet Take 1,000 mcg by mouth daily.    [provider]  ELIQUIS 5 MG TABS tablet Take 5 mg by mouth 2 (two) times daily.    [provider]  gabapentin (NEURONTIN) 300 MG capsule Take 1 capsule (300 mg total) by mouth at bedtime. 01/29/23   Zannie Cove, MD  Ipratropium-Albuterol (COMBIVENT) 20-100 MCG/ACT AERS respimat Inhale 1 puff into the lungs 2 (two) times daily.    [provider]  metoprolol succinate (TOPROL-XL) 25 MG 24 hr tablet Take 0.5 tablets (12.5 mg total) by mouth daily. 01/30/23   Zannie Cove, MD  midodrine (PROAMATINE) 10 MG tablet Take 1 tablet (10 mg total) by mouth 3 (three) times daily with meals. 01/29/23   Zannie Cove, MD  pantoprazole (PROTONIX) 40 MG tablet Take 40 mg by mouth daily.    [provider]  polyethylene glycol (MIRALAX / GLYCOLAX) 17 g packet Take 17 g by mouth daily as needed. 01/29/23   Zannie Cove, MD  potassium chloride SA (KLOR-CON M) 20 MEQ tablet Take 2 tablets (40 mEq total) by mouth daily. 01/29/23   Zannie Cove, MD  rosuvastatin (CRESTOR) 5 MG tablet Take 5 mg by mouth at bedtime. 07/02/22   [provider]  senna-docusate (SENOKOT-S) 8.6-50 MG tablet Take 2 tablets by mouth at bedtime. 01/29/23   Zannie Cove, MD  torsemide (DEMADEX) 20 MG tablet  Take 1 tablet (20 mg total) by mouth daily. 01/30/23   Zannie Cove, MD  traMADol (ULTRAM) 50 MG tablet Take 1 tablet (50 mg total) by mouth 2 (two) times daily. For moderate pain 01/29/23   Zannie Cove, MD  triamcinolone ointment (KENALOG) 0.1 % Apply 1 Application topically as needed (As directed). 04/17/20   [provider]    Inpatient Medications: Scheduled Meds:  Continuous Infusions:  amiodarone 60 mg/hr (02/02/2023 1352)   Followed by   amiodarone     PRN Meds:   Allergies:   No Known Allergies  Social History:   Social History   Socioeconomic History   Marital status: Widowed    Spouse name: Not on file   Number of children: 3   Years of education: Not on file   Highest education level: Not on file  Occupational History   Occupation: Retired  Tobacco Use   Smoking status: Former  Current packs/day: 0.00    Average packs/day: 1.5 packs/day for 51.0 years (76.5 ttl pk-yrs)    Types: Cigarettes    Start date: 06/11/1955    Quit date: 06/10/2006    Years since quitting: 16.6   Smokeless tobacco: Never  Vaping Use   Vaping status: Never Used  Substance and Sexual Activity   Alcohol use: No   Drug use: No   Sexual activity: Not on file  Other Topics Concern   Not on file  Social History Narrative   Not on file   Social Determinants of Health   Financial Resource Strain: Low Risk  (01/24/2023)   Overall Financial Resource Strain (CARDIA)    Difficulty of Paying Living Expenses: Not very hard  Food Insecurity: No Food Insecurity (01/24/2023)   Hunger Vital Sign    Worried About Running Out of Food in the Last Year: Never true    Ran Out of Food in the Last Year: Never true  Transportation Needs: No Transportation Needs (01/24/2023)   PRAPARE - Administrator, Civil Service (Medical): No    Lack of Transportation (Non-Medical): No  Physical Activity: Not on file  Stress: Not on file  Social Connections: Not on file  Intimate Partner  Violence: Not At Risk (01/11/2023)   Humiliation, Afraid, Rape, and Kick questionnaire    Fear of Current or Ex-Partner: No    Emotionally Abused: No    Physically Abused: No    Sexually Abused: No    Family History:    Family History  Problem Relation Age of Onset   Heart disease Mother    Heart disease Father    Heart disease Brother    Heart disease Sister    Heart Problems Brother    Diabetes Brother      ROS:  Please see the history of present illness.   All other ROS reviewed and negative.     Physical Exam/Data:   Vitals:   01/25/2023 1221 01/09/2023 1324 01/31/2023 1345  BP: 106/85  108/79  Pulse: (!) 41 (!) 131 (!) 111  Resp: (!) 22  20  Temp: 97.9 F (36.6 C)    SpO2: (!) 75% 99% 95%   No intake or output data in the 24 hours ending 01/31/2023 1612    01/18/2023   11:25 AM 01/30/2023    7:33 AM 01/29/2023    4:33 AM  Last 3 Weights  Weight (lbs) 181 lb 179 lb 2 oz 179 lb 10.8 oz  Weight (kg) 82.101 kg 81.25 kg 81.5 kg     There is no height or weight on file to calculate BMI.  General:  elderly female in mild respiratory distress on NRB HEENT: normal Neck: + JVD Cardiac:  iRRR,. 3/6 murmur difficult to hear with lung sounds Lungs:  distant lung sounds in upper right lobe, crackles in left base, poor air movement on right  Abd: soft, nontender, no hepatomegaly  Ext: significant B LE - left side edema through her thigh, lef LE erythema Skin: warm and dry  Neuro:  CNs 2-12 intact, no focal abnormalities noted Psych:  Normal affect   EKG:  The EKG was personally reviewed and demonstrates:  Afib VR 134 Telemetry:  Telemetry was personally reviewed and demonstrates:  Afib with rates in the 100-110s  Relevant CV Studies:  Echo 12/2022:  1. Left ventricular ejection fraction, by estimation, is 55% with beat to  beat variability. The left ventricle has normal function. The left  ventricle  has no regional wall motion abnormalities. There is mild left  ventricular  hypertrophy. Left  ventricular diastolic parameters are indeterminate.   2. Right ventricular systolic function is mildly reduced. The right  ventricular size is moderately enlarged. There is moderately elevated  pulmonary artery systolic pressure. The estimated right ventricular  systolic pressure is 50.5 mmHg.   3. Left atrial size was severely dilated.   4. Right atrial size was severely dilated.   5. The mitral valve is degenerative. Severe mitral valve regurgitation.  No evidence of mitral stenosis. Severe mitral annular calcification.   6. 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   7. The aortic valve is tricuspid. There is mild calcification of the  aortic valve. Aortic valve regurgitation is mild. No aortic stenosis is  present.   8. The inferior vena cava is dilated in size with <50% respiratory  variability, suggesting right atrial pressure of 15 mmHg.   Laboratory Data:  High Sensitivity Troponin:   Recent Labs  Lab 02/03/2023 1318  TROPONINIHS 30*     Chemistry Recent Labs  Lab 01/29/23 0051 01/12/2023 1200 01/27/2023 1318 01/24/2023 1359 02/05/2023 1449  NA 137 134* 140 137 138  K 3.9 4.2 4.6 4.5 3.9  CL 92* 93* 93* 95*  --   CO2 33* 29 30  --   --   GLUCOSE 104* 144* 112* 112*  --   BUN 29* 23 23 29*  --   CREATININE 1.34* 1.22* 1.21* 1.20*  --   CALCIUM 9.5 9.5 9.9  --   --   GFRNONAA 40* 44* 45*  --   --   ANIONGAP 12 12 17*  --   --     Recent Labs  Lab 01/10/2023 1200  PROT 6.8  ALBUMIN 3.6  AST 31  ALT 28  ALKPHOS 121  BILITOT 1.4*   Lipids No results for input(s): "CHOL", "TRIG", "HDL", "LABVLDL", "LDLCALC", "CHOLHDL" in the last 168 hours.  Hematology Recent Labs  Lab 01/25/2023 1200 02/01/2023 1318 01/24/2023 1359 02/03/2023 1449  WBC 5.5 5.6  --   --   RBC 4.08 4.09  --   --   HGB 12.0 11.8* 13.9 12.9  HCT 39.8 39.4 41.0 38.0  MCV 97.5 96.3  --   --   MCH 29.4 28.9  --   --   MCHC 30.2 29.9*  --   --   RDW 17.6* 17.7*  --   --   PLT 211 216  --    --    Thyroid No results for input(s): "TSH", "FREET4" in the last 168 hours.  BNP Recent Labs  Lab 01/09/2023 1200 02/03/2023 1318  BNP 604.6* 561.8*    DDimer No results for input(s): "DDIMER" in the last 168 hours.   Radiology/Studies:  No results found.   Assessment and Plan:   Atrial fibrillation with RVR PAF - severe BAE - PTA on amiodarone 200 mg daily and 25 mg toprol - rate control limited by hypotension in the past - has been started on IV amiodarone - agree - telemetry with rates now in the 100-110s - would hold BB for diuresis - can trial digoxin, given CrCl 35.9  can do 0.125 mg possibly every other day, check level, would  not load - question if RVR due to recurrent right pleural effusion    History of hypotension  / labile BP - BP stable for now - PTA on 10 mg midodrine TID - would resume this inpatient to help support  BP and allow for diuresis   Chronic anticoagulation - has been taking eliquis 5 mg BID - has not taken eliquis this morning - resume eliquis 5 mg BID   Acute on chronic diastolic heart failure Hx of takotsubo cardiomyopathy - 2011 with recovered EF Pulmonary hypertension - recent admission for respiratory failure and CAP - discharged on 20 mg torsemide - has received 40 mg IV lasix x 1 dose in the ER - agree with IV diuresis - BNP 561 - in similar range to last hospitalization - she is significantly volume up - will proceed with gentle diuresis given BP - continue 40 mg IV lasix BID - albumin 3.6 - may ultimately need coox to guide diuresis given overlying right pleural effusion   Right pleural effusion - CXR with B pleural effusions and basilar opacity: atelectasis vs aspiration PNA - right pleural effusion appears worse compared to 01/27/23 study - will trial diuresis, but may need repeat thoracentesis   Severe MR/severe TR - likely contributing to volume status - conservative treatment   Disposition Recommend  re-engaging palliative care    Risk Assessment/Risk Scores:    New York Heart Association (NYHA) Functional Class NYHA Class IV  CHA2DS2-VASc Score = 9   This indicates a 12.2% annual risk of stroke. The patient's score is based upon: CHF History: 1 HTN History: 1 Diabetes History: 1 Stroke History: 2 Vascular Disease History: 1 Age Score: 2 Gender Score: 1   For questions or updates, please contact [facility] Please consult [email or web address] for contact info under    Signed, Marcelino Duster, PA  01/23/2023 4:12 PM

## 2023-02-04 NOTE — H&P (Signed)
History and Physical    [name] ZOX:[id number] DOB: [birth year] DOA: 01/11/2023  PCP: Herma Carson, MD  Patient coming from: cardiology office I have personally briefly reviewed patient's old medical records in [facility]  Chief Complaint:  sob/ fatigue afib rvr   HPI: [name] is a 82 y.o. female with medical history significant of  hx of Takotsubo cardiomyopathy with VT/Vfib arrest in 2011 with recovered EF, chronic diastolic heart failure, PAF, DVT/PE, hx of CVA.   Of note patient has interim history of admission 7/27-8/21  due to acute on chronic hypoxic and hypercapnic respiratory failure, RLL CAP complicated by parapneumonic effusion requiring chest tube, acute on chronic combined CHF, A-fib with RVR and left leg cellulitis. Patient during that hospitalization was also noted to be hypotensive and required vasopressors which once weaned patient was supported with midodrine.  Patient now presents in referral from cardiology office as patient was found to have progressive sob/fatigue and EKG noting  afib rvr on evaluation during routine cardiology visit.  Of note per daughter patient over the last 2 days at rehab has felt unwell and has had increasing sob/doe and fatigue. Per daughter patient has also being using  bipap at bedtime in addition to chronic O2. Patient currently on NRB still with sob, and afib with rvr but no chest pain, no n/v/d/abdominal pain , no fever/chills or cough.  ED Course:   Afeb, bp 128/80 hr 137    rr22  Sat 89% on ra EKG-afib rvr Labs: wbc 5.5, hg 12,  plt 211,  Na 134, K 4.2, Cl 93 glu 144, cr 1.22 Bnp 604.6 CE30,27 Vbg:7.4/51.6  Tx amiodarone drip Review of Systems: As per HPI otherwise 10 point review of systems negative.   Past Medical History:  Diagnosis Date   Cardiac murmur    Echo, EF-55-60, mild tricuspid regurgitation   CVA (cerebral vascular accident) (HCC) 1991   DVT (deep venous thrombosis) (HCC)    PE (pulmonary embolism)     Takotsubo syndrome 08/2009   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   EF - 55-60, Tricuspid valve - mild-moderate regurgitation; 12/14/2009 - EF-55, mild to moderate tricuspid regurgitation    Past Surgical History:  Procedure Laterality Date   BIOPSY  11/02/2018   Procedure: BIOPSY;  Surgeon: Jeani Hawking, MD;  Location: [facility] ENDOSCOPY;  Service: Endoscopy;;   CARDIAC CATHETERIZATION  09/08/2009   EF-45, Free of disease   ESOPHAGOGASTRODUODENOSCOPY N/A 11/02/2018   Procedure: ESOPHAGOGASTRODUODENOSCOPY (EGD);  Surgeon: Jeani Hawking, MD;  Location[facility] ENDOSCOPY;  Service: Endoscopy;  Laterality: N/A;   FOREIGN BODY REMOVAL  11/02/2018   Procedure: FOREIGN BODY REMOVAL;  Surgeon: Jeani Hawking, MD;  Location: [facility] ENDOSCOPY;  Service: Endoscopy;;   THORACENTESIS Right 01/09/2023   Procedure: THORACENTESIS;  Surgeon: Tomma Lightning, MD;  Location: [facility] ENDOSCOPY;  Service: Pulmonary;  Laterality: Right;     reports that she quit smoking about 16 years ago. Her smoking use included cigarettes. She started smoking about 67 years ago. She has a 76.5 pack-year smoking history. She has never used smokeless tobacco. She reports that she does not drink alcohol and does not use drugs.  No Known Allergies  Family History  Problem Relation Age of Onset   Heart disease Mother    Heart disease Father    Heart disease Brother    Heart disease Sister    Heart Problems Brother    Diabetes Brother     Prior to Admission medications   Medication Sig  Start Date End Date Taking? Authorizing Provider  allopurinol (ZYLOPRIM) 100 MG tablet Take 100 mg by mouth daily.    [provider]  amiodarone (PACERONE) 200 MG tablet Take 1 tablet (200 mg total) by mouth 2 (two) times daily. Take 200 mg twice daily for 2 weeks and then 200 mg daily 01/29/23   Zannie Cove, MD  aspirin 81 MG tablet Take 81 mg by mouth daily.    [provider]  buPROPion (WELLBUTRIN XL) 300 MG 24 hr tablet Take 300  mg by mouth daily. 01/20/17   [provider]  calcium-vitamin D (OSCAL WITH D) 500-200 MG-UNIT tablet Take 1 tablet by mouth 2 (two) times daily.    [provider]  cyanocobalamin (VITAMIN B12) 1000 MCG tablet Take 1,000 mcg by mouth daily.    [provider]  ELIQUIS 5 MG TABS tablet Take 5 mg by mouth 2 (two) times daily.    [provider]  gabapentin (NEURONTIN) 300 MG capsule Take 1 capsule (300 mg total) by mouth at bedtime. 01/29/23   Zannie Cove, MD  Ipratropium-Albuterol (COMBIVENT) 20-100 MCG/ACT AERS respimat Inhale 1 puff into the lungs 2 (two) times daily.    [provider]  metoprolol succinate (TOPROL-XL) 25 MG 24 hr tablet Take 0.5 tablets (12.5 mg total) by mouth daily. 01/30/23   Zannie Cove, MD  midodrine (PROAMATINE) 10 MG tablet Take 1 tablet (10 mg total) by mouth 3 (three) times daily with meals. 01/29/23   Zannie Cove, MD  pantoprazole (PROTONIX) 40 MG tablet Take 40 mg by mouth daily.    [provider]  polyethylene glycol (MIRALAX / GLYCOLAX) 17 g packet Take 17 g by mouth daily as needed. 01/29/23   Zannie Cove, MD  potassium chloride SA (KLOR-CON M) 20 MEQ tablet Take 2 tablets (40 mEq total) by mouth daily. 01/29/23   Zannie Cove, MD  rosuvastatin (CRESTOR) 5 MG tablet Take 5 mg by mouth at bedtime. 07/02/22   [provider]  senna-docusate (SENOKOT-S) 8.6-50 MG tablet Take 2 tablets by mouth at bedtime. 01/29/23   Zannie Cove, MD  torsemide (DEMADEX) 20 MG tablet Take 1 tablet (20 mg total) by mouth daily. 01/30/23   Zannie Cove, MD  traMADol (ULTRAM) 50 MG tablet Take 1 tablet (50 mg total) by mouth 2 (two) times daily. For moderate pain 01/29/23   Zannie Cove, MD  triamcinolone ointment (KENALOG) 0.1 % Apply 1 Application topically as needed (As directed). 04/17/20   [provider]    Physical Exam: Vitals:   01/10/2023 1221 02/05/2023 1324 01/31/2023 1345  BP: 106/85   108/79  Pulse: (!) 41 (!) 131 (!) 111  Resp: (!) 22  20  Temp: 97.9 F (36.6 C)    SpO2: (!) 75% 99% 95%    Constitutional: NAD, calm, conversation doe Vitals:   02/08/2023 1221 01/18/2023 1324 01/23/2023 1345  BP: 106/85  108/79  Pulse: (!) 41 (!) 131 (!) 111  Resp: (!) 22  20  Temp: 97.9 F (36.6 C)    SpO2: (!) 75% 99% 95%   Eyes: PERRL, lids and conjunctivae normal ENMT: Mucous membranes are dry  Neck: normal, supple, no masses, no thyromegaly Respiratory: clear to auscultation bilaterally, no wheezing, no crackles. Normal respiratory effort. No accessory muscle use.  Cardiovascular: irRegular rate and rhythm, no murmurs / rubs / gallops. + extremity edema. 2+ pedal pulses Abdomen: no tenderness, no masses palpated. No hepatosplenomegaly. Bowel sounds positive.  Musculoskeletal: no clubbing /  cyanosis. No joint deformity upper and lower extremities. Good ROM, no contractures. Normal muscle tone.  Skin: no rashes, lesions, ulcers. No induration Neurologic: CN 2-12 grossly intact. Sensation intact, l. Strength 5/5 in all 4.  Psychiatric: Normal judgment and insight. Alert and oriented x 3. Normal mood.    Labs on Admission: I have personally reviewed following labs and imaging studies  CBC: Recent Labs  Lab 01/13/2023 1200 01/11/2023 1318 01/16/2023 1359 01/10/2023 1449  WBC 5.5 5.6  --   --   NEUTROABS  --  4.2  --   --   HGB 12.0 11.8* 13.9 12.9  HCT 39.8 39.4 41.0 38.0  MCV 97.5 96.3  --   --   PLT 211 216  --   --    Basic Metabolic Panel: Recent Labs  Lab 01/29/23 0051 01/30/2023 1200 02/05/2023 1318 01/12/2023 1359 02/03/2023 1449  NA 137 134* 140 137 138  K 3.9 4.2 4.6 4.5 3.9  CL 92* 93* 93* 95*  --   CO2 33* 29 30  --   --   GLUCOSE 104* 144* 112* 112*  --   BUN 29* 23 23 29*  --   CREATININE 1.34* 1.22* 1.21* 1.20*  --   CALCIUM 9.5 9.5 9.9  --   --    GFR: Estimated Creatinine Clearance: 35.9 mL/min (A) (by C-G formula based on SCr of 1.2 mg/dL (H)). Liver  Function Tests: Recent Labs  Lab 01/18/2023 1200  AST 31  ALT 28  ALKPHOS 121  BILITOT 1.4*  PROT 6.8  ALBUMIN 3.6   No results for input(s): "LIPASE", "AMYLASE" in the last 168 hours. No results for input(s): "AMMONIA" in the last 168 hours. Coagulation Profile: No results for input(s): "INR", "PROTIME" in the last 168 hours. Cardiac Enzymes: No results for input(s): "CKTOTAL", "CKMB", "CKMBINDEX", "TROPONINI" in the last 168 hours. BNP (last 3 results) No results for input(s): "PROBNP" in the last 8760 hours. HbA1C: No results for input(s): "HGBA1C" in the last 72 hours. CBG: Recent Labs  Lab 01/14/2023 1220  GLUCAP 123*   Lipid Profile: No results for input(s): "CHOL", "HDL", "LDLCALC", "TRIG", "CHOLHDL", "LDLDIRECT" in the last 72 hours. Thyroid Function Tests: No results for input(s): "TSH", "T4TOTAL", "FREET4", "T3FREE", "THYROIDAB" in the last 72 hours. Anemia Panel: No results for input(s): "VITAMINB12", "FOLATE", "FERRITIN", "TIBC", "IRON", "RETICCTPCT" in the last 72 hours. Urine analysis:    Component Value Date/Time   COLORURINE AMBER (A) 01/04/2023 1250   APPEARANCEUR CLOUDY (A) 01/04/2023 1250   LABSPEC 1.023 01/04/2023 1250   PHURINE 7.0 01/04/2023 1250   GLUCOSEU NEGATIVE 01/04/2023 1250   HGBUR NEGATIVE 01/04/2023 1250   BILIRUBINUR NEGATIVE 01/04/2023 1250   KETONESUR NEGATIVE 01/04/2023 1250   PROTEINUR 100 (A) 01/04/2023 1250   UROBILINOGEN 1.0 10/02/2009 1418   NITRITE POSITIVE (A) 01/04/2023 1250   LEUKOCYTESUR LARGE (A) 01/04/2023 1250    Radiological Exams on Admission: DG Chest Portable 1 View  Result Date: 01/11/2023 CLINICAL DATA:  Shortness of breath EXAM: PORTABLE CHEST 1 VIEW COMPARISON:  Chest radiograph dated 01/27/2023, CT chest dated 01/20/2023 FINDINGS: Unchanged elevation of the right hemidiaphragm. Similar asymmetric low right lung volumes with dense basilar opacity. No focal consolidations. No pneumothorax. Persistent bilateral  pleural effusions. Right heart border is obscured. No acute osseous abnormality. Right upper quadrant surgical clips. IMPRESSION: 1. Similar asymmetric low right lung volumes with dense basilar opacity, likely atelectasis. Aspiration or pneumonia can be considered in the appropriate clinical setting. 2. Persistent bilateral  pleural effusions. Electronically Signed   By: Agustin Cree M.D.   On: 01/12/2023 16:12    EKG: Independently reviewed. See above Assessment/Plan  Afib RVR  -severe bilateral atrial enlargement - with borderline bp  -continue on amiodarone drip  -trial of dig per cardiology rec  -Continue apixaban.   Acute on Chronic diastolic heart failure -continue with lasix 40 mg iv bid as bp tolerates -GDMT limited by hypotension, continue with  midodrine to support diuresis  - f/u on further cardiology recs    COPD -no acute exacerbation  -continue on controller medication     HLD  -continue on statin  Hx of DVT -continue on apixaban   Recent hx of  CAP Recent hx of Cellulitis  -s/p abx treatment /resolved   Gout  -continue on allopurinol   Peripheral neuropathy -resume home regimen as able       DVT prophylaxis: OAC Code Status: DNR/ as discussed per patient wishes in event of cardiac arrest  Family Communication:   Clarida,Pat (Daughter) [phone] (Home Phone)   Disposition Plan: patient  expected to be admitted greater than 2 midnights  Consults called: Cardiology Admission status: progressive care   Lurline Del MD [facility]   If 7PM-7AM, please contact night-coverage [email or web address] Password [facility]  01/31/2023, 4:43 PM

## 2023-02-04 NOTE — ED Notes (Signed)
Pt placed on non re breather by respiratory at this time

## 2023-02-04 NOTE — Progress Notes (Addendum)
HEART & VASCULAR TRANSITION OF CARE CONSULT NOTE     Referring Physician: Dr. Jomarie Longs Primary Care: Dr. Roney Mans Primary Cardiologist: Previously [facility]/has hospital f/u at [facility] scheduled  HPI: Referred to clinic by Dr. Jomarie Longs with [facility] for heart failure consultation. 82 y.o. female with history of Takotsubo cardiomyopathy and VT/Vfib arrest in 2011. EF later recovered and she has been followed by Cardiology at [facility]. Other cardiac history includes hx DVT/PE, PAF, TR, hx CVA.   Admitted 01/04/23 acute on chronic hypercarbic and hypercapneic respiratory failure 2/2 RLL CAP c/b parapneumonic effusion requiring chest tube placement w/ component of acute on chronic HFpEF, Afib with RVR and LLE cellulitis. She was hypotensive and required vasopressor support + midodrine. Echo with EF 55%, RV mildly reduced, RVSP 50 mmHg, severe BAE, severe MR, severe TR. Cardiology consulted. She was diuresed with IV lasix and started on IV amiodarone. Rate control meds limited by BP Did tolerate low dose metoprolol. Course further c/b persistent atelectasis of right lung and recurrent respiratory failure requiring BiPAP. O2 eventually weaned down to 3L [place].   She was seen by Palliative Care during admission. Had already been established DNAR/DNI - this was continued. Discharged to [facility] for rehab with referral for outpatient palliative services.   She is here today for hospital follow-up. She is accompanied by her daughter who assists with he history. She generally feels unwell. About 2 days ago her daughter noticed she seemed more short of breath and fatigued. She is dyspneic with conversation today. Patient reports she can stand and ambulate very short distances with a walker then gets very fatigued and dyspneic. Also reports orthopnea. LE edema seems a little worse the last couple of days. She started elevating her legs on a stool. No palpitations. She has been wearing 2L O2 [place].   Has not taken any of her  medications this morning.    Past Medical History:  Diagnosis Date   Cardiac murmur    Echo, EF-55-60, mild tricuspid regurgitation   CVA (cerebral vascular accident) (HCC) 1991   DVT (deep venous thrombosis) (HCC)    PE (pulmonary embolism)    Takotsubo syndrome 08/2009   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   EF - 55-60, Tricuspid valve - mild-moderate regurgitation; 12/14/2009 - EF-55, mild to moderate tricuspid regurgitation    Current Outpatient Medications  Medication Sig Dispense Refill   allopurinol (ZYLOPRIM) 100 MG tablet Take 100 mg by mouth daily.     amiodarone (PACERONE) 200 MG tablet Take 1 tablet (200 mg total) by mouth 2 (two) times daily. Take 200 mg twice daily for 2 weeks and then 200 mg daily     aspirin 81 MG tablet Take 81 mg by mouth daily.     buPROPion (WELLBUTRIN XL) 300 MG 24 hr tablet Take 300 mg by mouth daily.     calcium-vitamin D (OSCAL WITH D) 500-200 MG-UNIT tablet Take 1 tablet by mouth 2 (two) times daily.     cyanocobalamin (VITAMIN B12) 1000 MCG tablet Take 1,000 mcg by mouth daily.     ELIQUIS 5 MG TABS tablet Take 5 mg by mouth 2 (two) times daily.     gabapentin (NEURONTIN) 300 MG capsule Take 1 capsule (300 mg total) by mouth at bedtime.     Ipratropium-Albuterol (COMBIVENT) 20-100 MCG/ACT AERS respimat Inhale 1 puff into the lungs 2 (two) times daily.     metoprolol succinate (TOPROL-XL) 25 MG 24 hr tablet Take 0.5 tablets (12.5 mg total) by mouth daily.  midodrine (PROAMATINE) 10 MG tablet Take 1 tablet (10 mg total) by mouth 3 (three) times daily with meals.     pantoprazole (PROTONIX) 40 MG tablet Take 40 mg by mouth daily.     polyethylene glycol (MIRALAX / GLYCOLAX) 17 g packet Take 17 g by mouth daily as needed.     potassium chloride SA (KLOR-CON M) 20 MEQ tablet Take 2 tablets (40 mEq total) by mouth daily.     rosuvastatin (CRESTOR) 5 MG tablet Take 5 mg by mouth at bedtime.     senna-docusate (SENOKOT-S) 8.6-50 MG tablet Take 2  tablets by mouth at bedtime.     torsemide (DEMADEX) 20 MG tablet Take 1 tablet (20 mg total) by mouth daily.     traMADol (ULTRAM) 50 MG tablet Take 1 tablet (50 mg total) by mouth 2 (two) times daily. For moderate pain 30 tablet 0   triamcinolone ointment (KENALOG) 0.1 % Apply 1 Application topically as needed (As directed).     No current facility-administered medications for this encounter.    No Known Allergies    Social History   Socioeconomic History   Marital status: Widowed    Spouse name: Not on file   Number of children: 3   Years of education: Not on file   Highest education level: Not on file  Occupational History   Occupation: Retired  Tobacco Use   Smoking status: Former    Current packs/day: 0.00    Average packs/day: 1.5 packs/day for 51.0 years (76.5 ttl pk-yrs)    Types: Cigarettes    Start date: 06/11/1955    Quit date: 06/10/2006    Years since quitting: 16.6   Smokeless tobacco: Never  Vaping Use   Vaping status: Never Used  Substance and Sexual Activity   Alcohol use: No   Drug use: No   Sexual activity: Not on file  Other Topics Concern   Not on file  Social History Narrative   Not on file   Social Determinants of Health   Financial Resource Strain: Low Risk  (01/24/2023)   Overall Financial Resource Strain (CARDIA)    Difficulty of Paying Living Expenses: Not very hard  Food Insecurity: No Food Insecurity (01/24/2023)   Hunger Vital Sign    Worried About Running Out of Food in the Last Year: Never true    Ran Out of Food in the Last Year: Never true  Transportation Needs: No Transportation Needs (01/24/2023)   PRAPARE - Administrator, Civil Service (Medical): No    Lack of Transportation (Non-Medical): No  Physical Activity: Not on file  Stress: Not on file  Social Connections: Not on file  Intimate Partner Violence: Not At Risk (01/11/2023)   Humiliation, Afraid, Rape, and Kick questionnaire    Fear of Current or Ex-Partner: No     Emotionally Abused: No    Physically Abused: No    Sexually Abused: No      Family History  Problem Relation Age of Onset   Heart disease Mother    Heart disease Father    Heart disease Brother    Heart disease Sister    Heart Problems Brother    Diabetes Brother     Vitals:   01/24/2023 1125  BP: 128/80  Pulse: (!) 137  SpO2: (!) 89%  Weight: 82.1 kg (181 lb)    PHYSICAL EXAM: General:  Chronically ill appearing elderly female. HEENT: normal Neck: supple. JVP to jaw with prominent v waves.  Cor: PMI nondisplaced. Irregular rhythm, tachy. No rubs, gallops, 2/6 systolic murmur Lungs: diminished in bases. Conversational dyspnea. Abdomen: soft, nontender, nondistended.  Extremities: no cyanosis, clubbing, rash, 2+ edema to knees w/ chronic venous stasis changes Neuro: alert & oriented x 3. Affect pleasant.  ECG: Atrial fibrillation with ventricular rate of 134 bpm   ASSESSMENT & PLAN: Acute on chronic HFpEF -Hx Takotsubo cardiomyopathy in 2011 with recovered EF -Echo 01/05/23: EF 55%, RV mildly reduced, RVSP 50 mmHg, severe BAE, severe MR, severe TR. -NYHA IIIb/IV. Dyspneic w/ conversation today. Appears volume overloaded, however, weight is not up significantly. Currently on Torsemide 20 mg daily.  She will be sent to the ED for IV diuresis.  High risk for further decompensation with atrial fibrillation with RVR. -Can consider SLGT2i once stable -Has not been on ARNi/ARB or MRA d/t hypotension requiring midodrine. She is not hypotensive today. -CMET and BNP before heading to ED  2. Persistent atrial fibrillation -ECG reports from outside hospital suggest she has been back in Afib since at least 12/23 (tracings not available) -Currently RVR, rate 130s. Started on amiodarone (recently reduced to 200 mg daily) and metoprolol xl 12.5 mg daily during recent admit. Rate had improved to 100s-110s prior to discharge -Has severe BAE. Would likely be very difficult to restore  sinus rhythm -Anticoagulated with eliquis 5 mg BID, did not get meds at SNF this am -CBC today  3. 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 Chronic respiratory failure with hypoxia and hypercapnia -recent RLL CAP c/b parapneumonic effusion requiring chest tube, persistent R atelectasis -on supplemental O2 after most recent admission (O2 sats on finger have been unreliable) -suspect volume overload likely contributing to increased dyspnea today  5. Tremor -b/l upper extremity tremor -New since discharge -Check labs -? May be d/t amiodarone   Sent to the ED with rapid response for acute on chronic HFpEF and atrial fibrillation with RVR.   GOC: She is established DNR/DNI. Seen by Palliative Care during recent admission and referred for outpatient palliative follow-up  Referred to HFSW (PCP, Medications, Transportation, ETOH Abuse, Drug Abuse, Insurance, Financial ): No Refer to Pharmacy: No Refer to Home Health: No Refer to Advanced Heart Failure Clinic: No Refer to General Cardiology: No, has follow-up scheduled  Follow up  As needed, continue to follow with Cardiology

## 2023-02-05 ENCOUNTER — Encounter (HOSPITAL_COMMUNITY): Payer: Self-pay | Admitting: Internal Medicine

## 2023-02-05 ENCOUNTER — Other Ambulatory Visit: Payer: Self-pay

## 2023-02-05 DIAGNOSIS — I480 Paroxysmal atrial fibrillation: Secondary | ICD-10-CM | POA: Diagnosis not present

## 2023-02-05 LAB — BLOOD GAS, ARTERIAL
Acid-Base Excess: 11.9 mmol/L — ABNORMAL HIGH (ref 0.0–2.0)
Bicarbonate: 38.9 mmol/L — ABNORMAL HIGH (ref 20.0–28.0)
O2 Saturation: 89.4 %
Patient temperature: 36.6
pCO2 arterial: 59 mmHg — ABNORMAL HIGH (ref 32–48)
pH, Arterial: 7.43 (ref 7.35–7.45)
pO2, Arterial: 58 mmHg — ABNORMAL LOW (ref 83–108)

## 2023-02-05 LAB — COMPREHENSIVE METABOLIC PANEL
ALT: 25 U/L (ref 0–44)
AST: 30 U/L (ref 15–41)
Albumin: 3.3 g/dL — ABNORMAL LOW (ref 3.5–5.0)
Alkaline Phosphatase: 127 U/L — ABNORMAL HIGH (ref 38–126)
Anion gap: 11 (ref 5–15)
BUN: 18 mg/dL (ref 8–23)
CO2: 32 mmol/L (ref 22–32)
Calcium: 9.2 mg/dL (ref 8.9–10.3)
Chloride: 95 mmol/L — ABNORMAL LOW (ref 98–111)
Creatinine, Ser: 1.12 mg/dL — ABNORMAL HIGH (ref 0.44–1.00)
GFR, Estimated: 49 mL/min — ABNORMAL LOW (ref >60)
Glucose, Bld: 118 mg/dL — ABNORMAL HIGH (ref 70–99)
Potassium: 4.2 mmol/L (ref 3.5–5.1)
Sodium: 138 mmol/L (ref 135–145)
Total Bilirubin: 0.4 mg/dL (ref 0.3–1.2)
Total Protein: 6.3 g/dL — ABNORMAL LOW (ref 6.5–8.1)

## 2023-02-05 LAB — CBC
HCT: 36.9 % (ref 36.0–46.0)
Hemoglobin: 11.2 g/dL — ABNORMAL LOW (ref 12.0–15.0)
MCH: 29.7 pg (ref 26.0–34.0)
MCHC: 30.4 g/dL (ref 30.0–36.0)
MCV: 97.9 fL (ref 80.0–100.0)
Platelets: 202 10*3/uL (ref 150–400)
RBC: 3.77 MIL/uL — ABNORMAL LOW (ref 3.87–5.11)
RDW: 17.5 % — ABNORMAL HIGH (ref 11.5–15.5)
WBC: 6.9 10*3/uL (ref 4.0–10.5)
nRBC: 0.4 % — ABNORMAL HIGH (ref 0.0–0.2)

## 2023-02-05 MED ORDER — TRAMADOL HCL 50 MG PO TABS: 50.0000 mg | ORAL_TABLET | Freq: Two times a day (BID) | ORAL | Status: DC | PRN

## 2023-02-05 MED ORDER — ACETAMINOPHEN 500 MG PO TABS
1000.0000 mg | ORAL_TABLET | Freq: Four times a day (QID) | ORAL | Status: DC
  Administered 2023-02-05 (×2): 1000 mg via ORAL
  Filled 2023-02-05 (×2): qty 2

## 2023-02-05 MED ORDER — POLYETHYLENE GLYCOL 3350 17 G PO PACK
17.0000 g | PACK | Freq: Every day | ORAL | Status: DC
  Administered 2023-02-05: 17 g via ORAL
  Filled 2023-02-05: qty 1

## 2023-02-05 MED ORDER — SODIUM CHLORIDE 0.9 % IV SOLN
12.5000 mg | Freq: Once | INTRAVENOUS | Status: AC
  Administered 2023-02-05: 12.5 mg via INTRAVENOUS
  Filled 2023-02-05: qty 12.5

## 2023-02-05 MED ORDER — HYDROMORPHONE HCL 1 MG/ML IJ SOLN: 0.2500 mg | INTRAMUSCULAR | Status: DC | PRN

## 2023-02-05 NOTE — Plan of Care (Signed)
°  Problem: Education: °Goal: Ability to demonstrate management of disease process will improve °Outcome: Progressing °Goal: Ability to verbalize understanding of medication therapies will improve °Outcome: Progressing °Goal: Individualized Educational Video(s) °Outcome: Progressing °  °

## 2023-02-05 NOTE — Plan of Care (Signed)
  Problem: Education: Goal: Ability to demonstrate management of disease process will improve Outcome: Progressing Goal: Ability to verbalize understanding of medication therapies will improve Outcome: Progressing   Problem: Education: Goal: Understanding of medication regimen will improve Outcome: Progressing   Problem: Cardiac: Goal: Ability to achieve and maintain adequate cardiopulmonary perfusion will improve Outcome: Progressing   Problem: Education: Goal: Knowledge of General Education information will improve Description: Including pain rating scale, medication(s)/side effects and non-pharmacologic comfort measures Outcome: Progressing   Problem: Health Behavior/Discharge Planning: Goal: Ability to manage health-related needs will improve Outcome: Progressing   Problem: Clinical Measurements: Goal: Will remain free from infection Outcome: Progressing Goal: Cardiovascular complication will be avoided Outcome: Progressing   Problem: Coping: Goal: Level of anxiety will decrease Outcome: Progressing   Problem: Elimination: Goal: Will not experience complications related to urinary retention Outcome: Progressing   Problem: Pain Managment: Goal: General experience of comfort will improve Outcome: Progressing   Problem: Safety: Goal: Ability to remain free from injury will improve Outcome: Progressing   Problem: Skin Integrity: Goal: Risk for impaired skin integrity will decrease Outcome: Progressing   Problem: Activity: Goal: Capacity to carry out activities will improve Outcome: Not Progressing   Problem: Cardiac: Goal: Ability to achieve and maintain adequate cardiopulmonary perfusion will improve Outcome: Not Progressing   Problem: Education: Goal: Knowledge of disease or condition will improve Outcome: Not Progressing   Problem: Activity: Goal: Ability to tolerate increased activity will improve Outcome: Not Progressing   Problem: Clinical  Measurements: Goal: Ability to maintain clinical measurements within normal limits will improve Outcome: Not Progressing Goal: Diagnostic test results will improve Outcome: Not Progressing Goal: Respiratory complications will improve Outcome: Not Progressing   Problem: Activity: Goal: Risk for activity intolerance will decrease Outcome: Not Progressing   Problem: Nutrition: Goal: Adequate nutrition will be maintained Outcome: Not Progressing   Problem: Elimination: Goal: Will not experience complications related to bowel motility Outcome: Not Progressing

## 2023-02-05 NOTE — Progress Notes (Signed)
   02/05/23 0800  BiPAP/CPAP/SIPAP  BiPAP/CPAP/SIPAP Pt Type Adult  BiPAP/CPAP/SIPAP SERVO  Reason BIPAP/CPAP not in use Other(comment) (on standby)  BiPAP/CPAP /SiPAP Vitals  Pulse Rate 89  Resp (!) 26  BP 129/84  SpO2 (!) 80 %  MEWS Score/Color  MEWS Score 4  MEWS Score Color Red    Pt off bipap at this time and on 9L HFNC without complication.

## 2023-02-05 NOTE — Progress Notes (Signed)
PROGRESS NOTE    [name]  ELF:[id number] DOB: Mar 31, 1941 DOA: 01/16/2023 PCP: Herma Carson, MD    Brief Narrative:  82 year old with history of Takotsubo cardiomyopathy, VT and V-fib arrest in 2011 with recovered EF, chronic diastolic heart failure, paroxysmal A-fib, DVT and PE and history of stroke.  Recent extensive hospitalization due to acute on chronic hypoxemic and hypercapnic respiratory failure, right lower lobe pneumonia complicated by parapneumonic effusion requiring chest tube, acute on chronic combined heart failure, A-fib with RVR and left leg cellulitis.  Patient presents back to the hospital from cardiology office where she presented with progressive shortness of breath, fatigue and RVR with A-fib.  In the emergency room afebrile.  Blood pressure stable.  Heart rate 137.  89% on room air.  EKG with A-fib with RVR.  Blood gas analysis was essentially normal.  Admitted with A-fib RVR, started on amiodarone infusion.   Assessment & Plan:   Paroxysmal A-fib, A-fib with RVR: Patient on amiodarone and Toprol at home.  Difficulty tolerating rate control medications. Started on amiodarone infusion, continued.  Heart rate remains suboptimally controlled.  Holding beta-blockers. Therapeutic on Eliquis. Blood pressure low, currently able to tolerate amiodarone with midodrine 10 mg 3 times daily.  Acute on chronic diastolic heart failure, pulmonary hypertension: Oxygenation remains stable now.  At home on torsemide 20 mg daily. Remains on furosemide 40 mg twice daily and potassium supplementation.  Still significantly volume overloaded.  Continue IV Lasix.  Intake output monitoring.  Urine output measurement. Patient on 5 to 7 L of oxygen. She does have bilateral small pleural effusions, respiratory status is stable.  She has organized pleural effusion, will be difficult to drain.  Improved than before.  Abdomen pain: Cause unknown.  Patient does have nausea and abdominal pain.   Abdominal exam is benign.  Normal bowel function.  Symptomatic treatment.  Will give her some tramadol that she has used before.  Chronic medical issues including COPD and chronic hypoxemia, stable as above. Hyperlipidemia, on a statin History of DVT, on Eliquis Gout, on allopurinol   DVT prophylaxis:  apixaban (ELIQUIS) tablet 5 mg   Code Status: DNR Family Communication: None at the bedside Disposition Plan: Status is: Inpatient Remains inpatient appropriate because: A-fib RVR, on IV amiodarone, IV diuresis     Consultants:  Cardiology Palliative care  Procedures:  None  Antimicrobials:  None   Subjective: Patient seen and examined.  She feels sick, she has mild to moderate abdominal discomfort mostly in the periumbilical area that she cannot quantify.  She did eat some breakfast however felt sick.  Denies any vomiting.  Had normal bowel movement 2 days ago.  Denies any constipation.  Anxious.  Difficult historian.  Telemetry monitor with irregular heart rate, rate 90-120.  Objective: Vitals:   02/05/23 0400 02/05/23 0800 02/05/23 0825 02/05/23 0941  BP: (!) 111/98 129/84 116/66   Pulse: (!) 123 89 (!) 105   Resp: (!) 23 (!) 26 (!) 22   Temp:   (!) 97.5 F (36.4 C)   TempSrc:   Oral   SpO2: 99% (!) 80% 92% 100%  Weight:      Height:        Intake/Output Summary (Last 24 hours) at 02/05/2023 1101 Last data filed at 02/05/2023 0829 Gross per 24 hour  Intake 60 ml  Output 200 ml  Net -140 ml   Filed Weights   02/05/23 0000  Weight: 82.2 kg    Examination:  General exam: Appears anxious.  Hard of hearing. Respiratory system: No added sounds. Cardiovascular system: S1 & S2 heard, irregularly irregular.  Rate controlled.  Chronic stasis changes both legs.  1+ edema mostly chronic both legs. Gastrointestinal system: Soft.  Nontender.  Bowel sound present.  No organomegaly. Central nervous system: Alert and oriented. No focal neurological  deficits. Extremities: Symmetric 5 x 5 power.  Generalized weakness. Flat affect.  Anxious.    Data Reviewed: I have personally reviewed following labs and imaging studies  CBC: Recent Labs  Lab 01/17/2023 1200 01/23/2023 1318 01/23/2023 1359 01/17/2023 1449  WBC 5.5 5.6  --   --   NEUTROABS  --  4.2  --   --   HGB 12.0 11.8* 13.9 12.9  HCT 39.8 39.4 41.0 38.0  MCV 97.5 96.3  --   --   PLT 211 216  --   --    Basic Metabolic Panel: Recent Labs  Lab 01/19/2023 1200 02/02/2023 1318 01/28/2023 1359 01/14/2023 1449 01/24/2023 2315  NA 134* 140 137 138  --   K 4.2 4.6 4.5 3.9  --   CL 93* 93* 95*  --   --   CO2 29 30  --   --   --   GLUCOSE 144* 112* 112*  --   --   BUN 23 23 29*  --   --   CREATININE 1.22* 1.21* 1.20*  --   --   CALCIUM 9.5 9.9  --   --   --   MG  --   --   --   --  1.7  PHOS  --   --   --   --  2.9   GFR: Estimated Creatinine Clearance: 36.7 mL/min (A) (by C-G formula based on SCr of 1.2 mg/dL (H)). Liver Function Tests: Recent Labs  Lab 01/25/2023 1200  AST 31  ALT 28  ALKPHOS 121  BILITOT 1.4*  PROT 6.8  ALBUMIN 3.6   No results for input(s): "LIPASE", "AMYLASE" in the last 168 hours. No results for input(s): "AMMONIA" in the last 168 hours. Coagulation Profile: No results for input(s): "INR", "PROTIME" in the last 168 hours. Cardiac Enzymes: No results for input(s): "CKTOTAL", "CKMB", "CKMBINDEX", "TROPONINI" in the last 168 hours. BNP (last 3 results) No results for input(s): "PROBNP" in the last 8760 hours. HbA1C: No results for input(s): "HGBA1C" in the last 72 hours. CBG: Recent Labs  Lab 01/26/2023 1220  GLUCAP 123*   Lipid Profile: No results for input(s): "CHOL", "HDL", "LDLCALC", "TRIG", "CHOLHDL", "LDLDIRECT" in the last 72 hours. Thyroid Function Tests: No results for input(s): "TSH", "T4TOTAL", "FREET4", "T3FREE", "THYROIDAB" in the last 72 hours. Anemia Panel: No results for input(s): "VITAMINB12", "FOLATE", "FERRITIN", "TIBC", "IRON",  "RETICCTPCT" in the last 72 hours. Sepsis Labs: No results for input(s): "PROCALCITON", "LATICACIDVEN" in the last 168 hours.  No results found for this or any previous visit (from the past 240 hour(s)).       Radiology Studies: DG Chest Portable 1 View  Result Date: 02/03/2023 CLINICAL DATA:  Shortness of breath EXAM: PORTABLE CHEST 1 VIEW COMPARISON:  Chest radiograph dated 01/27/2023, CT chest dated 01/20/2023 FINDINGS: Unchanged elevation of the right hemidiaphragm. Similar asymmetric low right lung volumes with dense basilar opacity. No focal consolidations. No pneumothorax. Persistent bilateral pleural effusions. Right heart border is obscured. No acute osseous abnormality. Right upper quadrant surgical clips. IMPRESSION: 1. Similar asymmetric low right lung volumes with dense basilar opacity, likely atelectasis. Aspiration or pneumonia can be considered in  the appropriate clinical setting. 2. Persistent bilateral pleural effusions. Electronically Signed   By: Agustin Cree M.D.   On: 01/10/2023 16:12        Scheduled Meds:  allopurinol  100 mg Oral Daily   apixaban  5 mg Oral BID   aspirin  81 mg Oral Daily   buPROPion  300 mg Oral Daily   calcium-vitamin D  1 tablet Oral BID   cyanocobalamin  1,000 mcg Oral Daily   furosemide  40 mg Intravenous Q12H   gabapentin  300 mg Oral QHS   ipratropium-albuterol  3 mL Nebulization BID   midodrine  10 mg Oral TID WC   pantoprazole  40 mg Oral Daily   potassium chloride SA  40 mEq Oral Daily   rosuvastatin  5 mg Oral Daily   senna-docusate  2 tablet Oral QHS   Continuous Infusions:  amiodarone 30 mg/hr (02/05/23 0355)     LOS: 1 day    Time spent: 40 minutes    Dorcas Carrow, MD [facility]

## 2023-02-09 NOTE — Death Summary Note (Signed)
DEATH SUMMARY   Patient Details  Name: [name] MRN: [id number] DOB: 1940/07/28 [id number], Trudie Buckler, MD Admission/Discharge Information   Admit Date:  February 10, 2023  Date of Death: Date of Death: February 12, 2023  Time of Death: Time of Death: 03/12/23  Length of Stay: 2   Principle Cause of death: Congestive heart failure secondary to coronary artery disease  Hospital Diagnoses: Principal Problem:   CHF (congestive heart failure) (HCC) Active Problems:   Chronic bilateral pleural effusions   Hospital Course: 82 year old with history of Takotsubo cardiomyopathy, VT and V-fib arrest in 2011 with recovered EF, chronic diastolic heart failure, paroxysmal A-fib, DVT and PE and history of stroke.  Recent extensive hospitalization due to acute on chronic hypoxemic and hypercapnic respiratory failure, right lower lobe pneumonia complicated by parapneumonic effusion requiring chest tube, acute on chronic combined heart failure, A-fib with RVR and left leg cellulitis.  Patient presented back to the hospital from cardiology office where she presented with progressive shortness of breath, fatigue and RVR with A-fib.  In the emergency room afebrile.  Blood pressure stable.  Heart rate 137.  89% on room air.  EKG with A-fib with RVR.  Blood gas analysis was essentially normal.  Admitted with A-fib RVR, started on amiodarone infusion.   Patient remained in very poor clinical condition.  She was treated with amiodarone infusion.  Patient was also treated with intravenous diuretics.  She has extensive chronic medical issues including COPD, chronic hypoxemia, anticoagulated on Eliquis and gout.  While on symptomatic treatment, she had idioventricular rhythm followed by PEA arrest and peacefully died while in the hospital.         Procedures: None  Consultations: Cardiology, palliative care  The results of significant diagnostics from this hospitalization (including imaging, microbiology, ancillary and  laboratory) are listed below for reference.   Significant Diagnostic Studies: DG Chest Portable 1 View  Result Date: 2023-02-10 CLINICAL DATA:  Shortness of breath EXAM: PORTABLE CHEST 1 VIEW COMPARISON:  Chest radiograph dated 01/27/2023, CT chest dated 01/20/2023 FINDINGS: Unchanged elevation of the right hemidiaphragm. Similar asymmetric low right lung volumes with dense basilar opacity. No focal consolidations. No pneumothorax. Persistent bilateral pleural effusions. Right heart border is obscured. No acute osseous abnormality. Right upper quadrant surgical clips. IMPRESSION: 1. Similar asymmetric low right lung volumes with dense basilar opacity, likely atelectasis. Aspiration or pneumonia can be considered in the appropriate clinical setting. 2. Persistent bilateral pleural effusions. Electronically Signed   By: Agustin Cree M.D.   On: 2023-02-10 16:12   DG CHEST PORT 1 VIEW  Result Date: 01/27/2023 CLINICAL DATA:  Shortness of breath EXAM: PORTABLE CHEST 1 VIEW COMPARISON:  Chest radiograph 01/25/2023 FINDINGS: No change from 01/25/2023. Stable cardiomediastinal silhouette. Aortic atherosclerotic calcification. Right lower and perihilar consolidation. Retrocardiac consolidation. Small bilateral pleural effusions. No pneumothorax. IMPRESSION: Unchanged bilateral airspace opacities. Small bilateral pleural effusions. Electronically Signed   By: Minerva Fester M.D.   On: 01/27/2023 01:19   DG Chest 2 View  Result Date: 01/25/2023 CLINICAL DATA:  Hypoxia EXAM: CHEST - 2 VIEW COMPARISON:  Chest x-ray 01/21/2023.  Chest CT 01/20/2023. FINDINGS: Right chest tube is no longer seen. There is no pneumothorax. Parenchymal opacities in the medial right mid and lower lung appear unchanged. Minimal retrocardiac opacities persist. The cardiomediastinal silhouette appears stable. There is a small left pleural effusion, unchanged. The osseous structures are stable. IMPRESSION: 1. Right chest tube is no longer seen.  No pneumothorax. 2. Unchanged parenchymal opacities in the medial right mid  and lower lung. Electronically Signed   By: Darliss Cheney M.D.   On: 01/25/2023 17:47   DG Chest Port 1 View  Result Date: 01/21/2023 CLINICAL DATA:  Pneumothorax EXAM: PORTABLE CHEST 1 VIEW COMPARISON:  01/20/2023 FINDINGS: Interval repositioning of right-sided pigtail chest tube, tip remains about the right lung base. Complete consolidation of the right lower lobe and elevation of the right hemidiaphragm. No visible pneumothorax. Small layering left pleural effusion. Cardiomegaly. Osseous structures unremarkable. IMPRESSION: 1. Interval repositioning of right-sided pigtail chest tube, tip remains about the right lung base. No visible pneumothorax. 2. Complete consolidation of the right lower lobe and elevation of the right hemidiaphragm. 3. Small layering left pleural effusion. 4. Cardiomegaly. Electronically Signed   By: Jearld Lesch M.D.   On: 01/21/2023 09:51   DG CHEST PORT 1 VIEW  Result Date: 01/20/2023 CLINICAL DATA:  Pleural effusion EXAM: PORTABLE CHEST 1 VIEW COMPARISON:  01/19/2023 FINDINGS: Shallow inspiration. Diffuse cardiac enlargement with mild vascular congestion. Bilateral pleural effusions with basilar atelectasis or consolidation. A right chest tube is in place without change in position since prior study. No visible residual pneumothorax. Tortuous aorta. Overall stable appearance since previous study. IMPRESSION: 1. Bilateral pleural effusions with basilar atelectasis or consolidation. 2. Right chest tube remains in place. No significant residual pneumothorax. 3. Cardiac enlargement with pulmonary vascular congestion Electronically Signed   By: Burman Nieves M.D.   On: 01/20/2023 17:44   CT CHEST WO CONTRAST  Result Date: 01/20/2023 CLINICAL DATA:  Pneumonia. EXAM: CT CHEST WITHOUT CONTRAST TECHNIQUE: Multidetector CT imaging of the chest was performed following the standard protocol without IV  contrast. RADIATION DOSE REDUCTION: This exam was performed according to the departmental dose-optimization program which includes automated exposure control, adjustment of the mA and/or kV according to patient size and/or use of iterative reconstruction technique. COMPARISON:  01/09/2023 FINDINGS: Cardiovascular: The heart is enlarged. Trace pericardial effusion similar to prior. Mitral annular calcification evident. Mild atherosclerotic calcification is noted in the wall of the thoracic aorta. Enlargement of the pulmonary outflow tract/main pulmonary arteries suggests pulmonary arterial hypertension. Mediastinum/Nodes: Multinodular thyroid gland with exophytic posterior right 1.9 cm thyroid nodule. Scattered upper normal mediastinal lymph nodes. No evidence for gross hilar lymphadenopathy although assessment is limited by the lack of intravenous contrast on the current study. The esophagus has normal imaging features. There is no axillary lymphadenopathy. Lungs/Pleura: Small anterior right pneumothorax evident with right pleural drain evident. Generalized improvement in aeration of the right upper lung with persistent right base collapse/consolidative opacity. Similar generalized improvement in aeration of the left lung with persistent but decreased left base collapse/consolidation. Small left pleural effusion is similar to prior. Pleural fluid on the right has decreased in the interval. Upper Abdomen: Unremarkable. Musculoskeletal: No worrisome lytic or sclerotic osseous abnormality. IMPRESSION: 1. Small anterior right pneumothorax with right pleural drain in place. 2. Generalized improvement in aeration of both lungs with persistent but decreased bibasilar collapse/consolidation. 3. Small left pleural effusion, similar to prior. 4. Enlargement of the pulmonary outflow tract/main pulmonary arteries suggests pulmonary arterial hypertension. 5. Multinodular thyroid gland with exophytic posterior right 1.9 cm  thyroid nodule. Recommend thyroid US (ref: J Am Coll Radiol. 2015 Feb;12(2): 143-50). 6.  Aortic Atherosclerosis (ICD10-I70.0). Electronically Signed   By: Kennith Center M.D.   On: 01/20/2023 07:58   DG Chest Port 1 View  Result Date: 01/19/2023 CLINICAL DATA:  [id number] with right pleural effusion and chest tube in place. EXAM: PORTABLE CHEST 1 VIEW COMPARISON:  Portable  chest yesterday at 4:46 a.m. FINDINGS: 5:21 a.m. there is a small right apical pneumothorax again noted estimated 5% or less of the chest volume. There is no mediastinal shift. A pigtail right chest tube in the lower hemithorax has been pulled back, with only the pigtail portion and about 1.5 cm of the straight portion of the distal catheter remaining intrathoracic. There is persistent dense consolidation extending over the right lower lung field and a small to moderate-sized right pleural effusion persists as well. This is unchanged as well as a small left pleural effusion with overlying hazy atelectasis or consolidation in the left lower lung field. The left mid and both upper lung fields remain clear. There is cardiomegaly without evidence of CHF. Aortic atherosclerosis. Osteopenia. IMPRESSION: 1. Small right apical pneumothorax, estimated 5% or less of the chest volume. 2. The right chest tube has been pulled back, with only the pigtail portion and about 1.5 cm of the straight portion of the distal catheter remaining in the chest. 3. Stable appearance of the right greater than left pleural effusions with overlying atelectasis or consolidation. 4. Cardiomegaly. Electronically Signed   By: Almira Bar M.D.   On: 01/19/2023 06:51   DG Chest Port 1 View  Result Date: 01/18/2023 CLINICAL DATA:  82 year old female with history of pleural effusion on the right. EXAM: PORTABLE CHEST 1 VIEW COMPARISON:  Chest x-ray 01/17/2023. FINDINGS: Previously noted small bore right-sided chest tube is similar in position with tip projecting over the lower  right hemithorax. Persistent opacity throughout the right mid to lower hemithorax compatible with atelectasis and/or consolidation with superimposed moderate to large right pleural effusion. Small right apical pneumothorax, unchanged. Small left pleural effusion with atelectasis and/or consolidation in the left lung base as well. Diffuse interstitial prominence and widespread peribronchial cuffing throughout the aerated portion of the left lung. Pulmonary vasculature does not appear engorged. Heart size is enlarged. The patient is rotated to the right on today's exam, resulting in distortion of the mediastinal contours and reduced diagnostic sensitivity and specificity for mediastinal pathology. IMPRESSION: 1. Stable position of right chest tube with persistent right hydropneumothorax. 2. Small left pleural effusion. 3. Extensive bibasilar areas of atelectasis and/or consolidation, similar to the prior study. 4. Diffuse interstitial prominence and peribronchial cuffing, suggesting a background of bronchitis. 5. Cardiomegaly. Electronically Signed   By: Trudie Reed M.D.   On: 01/18/2023 08:10   DG Chest Port 1 View  Result Date: 01/17/2023 CLINICAL DATA:  Respiratory distress EXAM: PORTABLE CHEST 1 VIEW COMPARISON:  Chest x-ray dated January 17, 2023 FINDINGS: Unchanged enlarged cardiac and mediastinal contours. Small right apical pneumothorax. Right-sided chest tube in place. Similar heterogeneous opacities of the lower right hemithorax. Increased left basilar opacities. IMPRESSION: 1. Small right apical pneumothorax. Right-sided chest tube in place. 2. Similar heterogeneous opacities of the lower right hemithorax. 3. Increased left basilar opacities, which may be due to atelectasis, infection or aspiration. Electronically Signed   By: Allegra Lai M.D.   On: 01/17/2023 11:13   DG Chest Port 1 View  Result Date: 01/17/2023 CLINICAL DATA:  227 T1-2.  Status post thoracostomy tube placement. EXAM:  PORTABLE CHEST 1 VIEW COMPARISON:  Portable chest yesterday at 12:27 p.m. FINDINGS: 5:08 a.m. A right pigtail tube thoracostomy again is noted with the pigtail superimposing in the lower lung field. Small to moderate right pleural effusion remains with overlying consolidation or atelectasis over the right mid to lower lung field. The right apex is clear. On the left, small  pleural effusion continues to be seen with overlying hazy airspace disease. Remaining left lung is clear. The heart is enlarged. Stable mediastinum with aortic tortuosity, ectasia and scattered calcific plaque. There is mild perihilar vascular prominence. Osteopenia. Overall aeration is not significantly changed allowing for positional differences. IMPRESSION: 1. No significant change from yesterday's most recent exam. Right pigtail thoracostomy tube remains in place. 2. Small to moderate right pleural effusion remains with overlying dense consolidation or atelectasis. 3. Small left pleural effusion with overlying less dense hazy airspace disease. 4. Cardiomegaly with mild perihilar vascular prominence. Electronically Signed   By: Almira Bar M.D.   On: 01/17/2023 07:08   DG Chest Port 1 View  Result Date: 01/16/2023 CLINICAL DATA:  Status post thoracostomy tube placement. EXAM: PORTABLE CHEST 1 VIEW COMPARISON:  Same day. FINDINGS: Interval placement of right-sided pleural pigtail drainage catheter. Pleural effusion is significantly smaller. No definite pneumothorax is noted. IMPRESSION: Right pleural effusion is significantly smaller status post pleural drainage catheter placement. Electronically Signed   By: Lupita Raider M.D.   On: 01/16/2023 13:43   DG Chest Port 1 View  Result Date: 01/16/2023 CLINICAL DATA:  Status post right thoracentesis. EXAM: PORTABLE CHEST 1 VIEW COMPARISON:  Earlier the same day. FINDINGS: Since the prior study, patient underwent a right-sided thoracentesis. There is mild interval decrease in the right  pleural effusion, which can be quantified as moderate-to-large on this exam. No pneumothorax seen. There is small left pleural effusion with underlying compressive atelectatic changes without significant interval change. No left pneumothorax. Evaluation of cardiomediastinal silhouette is nondiagnostic due to right lower hemithorax opacification. No acute osseous abnormalities. The soft tissues are within normal limits. IMPRESSION: Mild interval decrease in right pleural effusion status post thoracentesis. No pneumothorax. Electronically Signed   By: Jules Schick M.D.   On: 01/16/2023 11:48   DG CHEST PORT 1 VIEW  Result Date: 01/16/2023 CLINICAL DATA:  Dyspnea EXAM: PORTABLE CHEST 1 VIEW COMPARISON:  01/15/2023 FINDINGS: Large right and moderate left pleural effusions are present with bibasilar compressive atelectasis. Superimposed mild perihilar interstitial pulmonary edema is present and together the findings suggest changes of moderate cardiogenic failure. No pneumothorax. Cardiac silhouette is largely obscured by pleural fluid on this rotated examination. No acute bone abnormality. IMPRESSION: 1. Moderate cardiogenic failure with moderate to large pleural effusions, right greater than left. Electronically Signed   By: Helyn Numbers M.D.   On: 01/16/2023 03:37   DG Chest Port 1 View  Result Date: 01/15/2023 CLINICAL DATA:  [id number] Respiratory failure (HCC) [id number] EXAM: PORTABLE CHEST 1 VIEW COMPARISON:  01/14/2023. FINDINGS: Central pulmonary vascular congestion, similar to the prior study. There is homogeneous opacification of right lower hemithorax with shift of mediastinal structures to the right suggesting combination of right lung lower lobe collapse and/or consolidation with pleural effusion. There is improved aeration of the right upper lobe when compared to the prior exam. There is small left pleural effusion with probable associated compressive atelectatic changes in the left lung lower lobe,  essentially unchanged. No pneumothorax on either side. Evaluation of cardiomediastinal silhouette is limited due to right hemithorax opacification. No acute osseous abnormalities. The soft tissues are within normal limits. IMPRESSION: 1. Improved aeration of the right upper lobe when compared to the prior exam. 2. Persistent right lower lobe collapse and/or consolidation with pleural effusion. 3. Small left pleural effusion with probable associated compressive atelectatic changes in the left lung lower lobe. 4. Central pulmonary vascular congestion. Electronically Signed   By:  Jules Schick M.D.   On: 01/15/2023 08:13   DG CHEST PORT 1 VIEW  Result Date: 01/14/2023 CLINICAL DATA:  Pleural effusion, CHF EXAM: PORTABLE CHEST 1 VIEW COMPARISON:  Previous studies including the examination of 01/13/2023 FINDINGS: Transverse diameter of heart is increased. Dense calcification is seen in the mitral annulus. Central pulmonary vessels are prominent. Increase in interstitial markings in both lungs suggests interstitial edema. There is possible interval decrease in left pleural effusion. There is opacification of right mid and right lower lung fields with interval worsening suggesting possible increase in right pleural effusion and underlying atelectasis. There is no pneumothorax. IMPRESSION: Cardiomegaly. Prominence of central pulmonary vessels suggest COPD. There is increased opacification of right mid and right lower lung fields suggesting increase in right pleural effusion and possibly worsening atelectasis. Electronically Signed   By: Ernie Avena M.D.   On: 01/14/2023 08:32   DG CHEST PORT 1 VIEW  Result Date: 01/13/2023 CLINICAL DATA:  Hypoxia EXAM: PORTABLE CHEST 1 VIEW COMPARISON:  01/13/2023 earlier. FINDINGS: Decreasing right-sided effusion with significant residual. Adjacent opacity. Small left effusion with adjacent opacity. Vascular congestion. No pneumothorax. Enlarged cardiopericardial silhouette  with calcified tortuous aorta. Overlapping cardiac leads. Film is rotated. IMPRESSION: Decreased right effusion compared to previous. Significant residual. Persistent small left effusion. Enlarged heart with vascular congestion. No pneumothorax. Electronically Signed   By: Karen Kays M.D.   On: 01/13/2023 18:34   DG CHEST PORT 1 VIEW  Result Date: 01/13/2023 CLINICAL DATA:  Acute hypoxic respiratory failure EXAM: PORTABLE CHEST 1 VIEW COMPARISON:  Chest x-ray 01/12/2023 FINDINGS: There is complete opacification of the right hemithorax, significantly worse when compared to prior study. Small left pleural effusion persists. Cardiomediastinal silhouette is stable. There is no pneumothorax or acute fracture. IMPRESSION: Complete opacification of the right hemithorax, significantly worse when compared to prior study. Findings are most consistent with a large right pleural effusion. Underlying atelectasis/consolidation not excluded. Electronically Signed   By: Darliss Cheney M.D.   On: 01/13/2023 15:08   DG Chest Port 1 View  Result Date: 01/12/2023 CLINICAL DATA:  Respiratory failure. EXAM: PORTABLE CHEST 1 VIEW COMPARISON:  Chest x-ray dated January 10, 2023. FINDINGS: Unchanged cardiomegaly. Similar moderate to large right and small left pleural effusions with right middle and lower lobe collapse. Similar patchy opacities in the right upper lobe. Unchanged left lower lobe atelectasis. No pneumothorax. No acute osseous abnormality. IMPRESSION: 1. Similar moderate to large right and small left pleural effusions with right middle and lower lobe collapse. 2. Similar right upper lobe airspace disease which could reflect pulmonary edema or pneumonia. Electronically Signed   By: Obie Dredge M.D.   On: 01/12/2023 08:46   DG CHEST PORT 1 VIEW  Result Date: 01/10/2023 CLINICAL DATA:  [id number] Respiratory failure (HCC) [id number] EXAM: PORTABLE CHEST 1 VIEW COMPARISON:  Chest x-ray 01/10/2023 11:51 a.m. FINDINGS: Interval  removal of endotracheal and enteric tubes. The heart and mediastinal contours are within normal limits. Persistent low lung volumes with at least moderate volume right and small left pleural effusions. Associated underlying airspace opacities. Mild pulmonary edema. No pneumothorax. No acute osseous abnormality. IMPRESSION: 1. Persistent low lung volumes with at least moderate volume right and small left pleural effusions. 2. Mild pulmonary edema. 3. Interval removal endotracheal and enteric tubes. Electronically Signed   By: Tish Frederickson M.D.   On: 01/10/2023 23:17   DG CHEST PORT 1 VIEW  Result Date: 01/10/2023 CLINICAL DATA:  Orogastric tube placement.  Intubation. EXAM: PORTABLE  CHEST 1 VIEW COMPARISON:  Same day. FINDINGS: Endotracheal tube is in grossly good position. Nasogastric tube is seen entering stomach. IMPRESSION: Endotracheal and nasogastric tubes in grossly good position. Electronically Signed   By: Lupita Raider M.D.   On: 01/10/2023 13:10   DG Abd Portable 1V  Result Date: 01/10/2023 CLINICAL DATA:  NG tube placement. EXAM: PORTABLE ABDOMEN - 1 VIEW COMPARISON:  03/23/2015 FINDINGS: NG tube tip is in the mid stomach. Proximal side port projects at or just below the GE junction. Tube could be advanced another 3-4 cm to ensure that the side port is below the GE junction. Visualized portion the abdomen shows nonspecific gas pattern. Bones are demineralized with fusion hardware noted in the lumbosacral region. IMPRESSION: NG tube tip is in the mid stomach with proximal side port at or just below the GE junction. Tube could be advanced another 3-4 cm to ensure that the side port is below the GE junction. Electronically Signed   By: Kennith Center M.D.   On: 01/10/2023 13:00   DG CHEST PORT 1 VIEW  Result Date: 01/10/2023 CLINICAL DATA:  Hypoxia EXAM: PORTABLE CHEST 1 VIEW COMPARISON:  01/09/2023 FINDINGS: Stable cardiomediastinal contours. There is a large right pleural effusion with  significantly diminished aeration to the basilar right upper lobe, right middle lobe and right base. This is unchanged from previous exam. Smaller left pleural effusion is also unchanged with atelectasis in volume loss from the left lower lobe. No new findings. IMPRESSION: No change in large right and small left pleural effusions with associated atelectasis and volume loss. Electronically Signed   By: Signa Kell M.D.   On: 01/10/2023 08:15   CT CHEST WO CONTRAST  Result Date: 01/09/2023 CLINICAL DATA:  Shortness of breath EXAM: CT CHEST WITHOUT CONTRAST TECHNIQUE: Multidetector CT imaging of the chest was performed following the standard protocol without IV contrast. RADIATION DOSE REDUCTION: This exam was performed according to the departmental dose-optimization program which includes automated exposure control, adjustment of the mA and/or kV according to patient size and/or use of iterative reconstruction technique. COMPARISON:  Chest CT dated May 22, 2022 FINDINGS: Cardiovascular: Cardiomegaly. Trace pericardial effusion. Normal caliber thoracic aorta with moderate calcified plaque. Mitral annular calcifications. Mild coronary artery calcifications. Mediastinum/Nodes: Esophagus is unremarkable. Enlarged multinodular right thyroid lobe, unchanged when compared with the prior exam. Lungs/Pleura: Complete occlusion of the right-sided bronchi. Complete collapse of the right lower lobe and right middle lobe. Large right and small left loculated pleural effusions. Near-complete collapse of the right upper lobe, aerated portion with diffuse ground-glass opacities. Mild patchy ground-glass opacities of the left hemithorax with small area of consolidation seen in the lingula on series 3, image 65. Upper Abdomen: Prior cholecystectomy.  No acute abnormality. Musculoskeletal: Soft tissue gas of the posterior right chest wall, likely due to recent thoracentesis. No aggressive appearing osseous lesions.  IMPRESSION: 1. Complete occlusion of the right-sided bronchi, likely due to large volume aspiration. 2. Complete collapse of the right lower lobe and right middle lobe. Near-complete collapse of the right upper lobe. 3. Diffuse ground-glass opacities of the aerated portion of the right upper lobe, likely due to re-expansion pulmonary edema given recent thoracentesis. 4. Large right and small left loculated pleural effusions. 5. Mild patchy ground-glass opacities of the left hemithorax with small area of consolidation seen in the lingula, likely infectious or inflammatory. 6. Aortic Atherosclerosis (ICD10-I70.0). Electronically Signed   By: Allegra Lai M.D.   On: 01/09/2023 15:02   DG  CHEST PORT 1 VIEW  Result Date: 01/09/2023 CLINICAL DATA:  [id number] S/P thoracentesis [id number] EXAM: PORTABLE CHEST 1 VIEW COMPARISON:  Earlier the same day at 5:25 a.m. FINDINGS: Since the prior study, patient underwent presumed right-sided thoracentesis. There is interval decrease in the right pleural effusion with now aeration of the right upper lung zone. No pneumothorax. There is small left pleural effusion, grossly similar to the prior study. No pneumothorax. Rest of the findings are essentially unchanged. IMPRESSION: 1. Interval decrease in right pleural effusion. No pneumothorax. Electronically Signed   By: Jules Schick M.D.   On: 01/09/2023 11:22    Microbiology: No results found for this or any previous visit (from the past 240 hour(s)).  Time spent: 0 minutes  Signed: Dorcas Carrow, MD 02/05/2023

## 2023-02-09 NOTE — Significant Event (Signed)
Paged by RN at [phone], informed that pt had expired at 0026.  Pt DNR, MOST form in chart confirms this.  Pt seen and evaluated at bedside: no respiratory motion, no audible breath sounds nor heart sounds.  No response to noxious stimuli.  Monitor reviewed: Looks like idioventricular rhythm at 0022, followed by asystole at 0026.  Pt pronounced by myself at 0100.  Daughter notified by RN and on way to hospital.

## 2023-02-09 NOTE — Progress Notes (Signed)
Pt. Expired at 0026. Pts. Daughter, Cammy Copa, made aware. Attempt x 2 made to notify pts. Son, Clairessa Madura unsuccessful.

## 2023-02-09 DEATH — deceased

## 2023-02-18 ENCOUNTER — Ambulatory Visit: Payer: Medicare Other | Admitting: Nurse Practitioner
